# Patient Record
Sex: Female | Born: 1945
Health system: Southern US, Community
[De-identification: ages and names within clinical notes are randomized; demographics above are authoritative.]

## PROBLEM LIST (undated history)

## (undated) DIAGNOSIS — G8929 Other chronic pain: Secondary | ICD-10-CM

## (undated) DIAGNOSIS — R51 Headache: Secondary | ICD-10-CM

## (undated) DIAGNOSIS — M542 Cervicalgia: Secondary | ICD-10-CM

## (undated) DIAGNOSIS — J449 Chronic obstructive pulmonary disease, unspecified: Secondary | ICD-10-CM

## (undated) DIAGNOSIS — M654 Radial styloid tenosynovitis [de Quervain]: Secondary | ICD-10-CM

## (undated) DIAGNOSIS — E119 Type 2 diabetes mellitus without complications: Secondary | ICD-10-CM

## (undated) DIAGNOSIS — I10 Essential (primary) hypertension: Secondary | ICD-10-CM

## (undated) DIAGNOSIS — E7800 Pure hypercholesterolemia, unspecified: Secondary | ICD-10-CM

## (undated) DIAGNOSIS — IMO0002 Reserved for concepts with insufficient information to code with codable children: Secondary | ICD-10-CM

## (undated) DIAGNOSIS — K219 Gastro-esophageal reflux disease without esophagitis: Secondary | ICD-10-CM

## (undated) DIAGNOSIS — J301 Allergic rhinitis due to pollen: Secondary | ICD-10-CM

## (undated) DIAGNOSIS — Z8619 Personal history of other infectious and parasitic diseases: Secondary | ICD-10-CM

## (undated) DIAGNOSIS — D869 Sarcoidosis, unspecified: Secondary | ICD-10-CM

## (undated) DIAGNOSIS — J40 Bronchitis, not specified as acute or chronic: Secondary | ICD-10-CM

## (undated) DIAGNOSIS — G4700 Insomnia, unspecified: Secondary | ICD-10-CM

## (undated) DIAGNOSIS — F419 Anxiety disorder, unspecified: Secondary | ICD-10-CM

## (undated) DIAGNOSIS — M7500 Adhesive capsulitis of unspecified shoulder: Secondary | ICD-10-CM

## (undated) DIAGNOSIS — R42 Dizziness and giddiness: Secondary | ICD-10-CM

## (undated) DIAGNOSIS — K5792 Diverticulitis of intestine, part unspecified, without perforation or abscess without bleeding: Secondary | ICD-10-CM

## (undated) DIAGNOSIS — M810 Age-related osteoporosis without current pathological fracture: Secondary | ICD-10-CM

## (undated) HISTORY — DX: Adhesive capsulitis of unspecified shoulder: M75.00

## (undated) HISTORY — PX: BREAST LUMPECTOMY: SHX2

## (undated) HISTORY — DX: Age-related osteoporosis without current pathological fracture: M81.0

## (undated) HISTORY — DX: Type 2 diabetes mellitus without complications: E11.9

## (undated) HISTORY — PX: BREAST SURGERY: SHX581

## (undated) HISTORY — PX: COLONOSCOPY: SHX174

## (undated) HISTORY — PX: TONSILLECTOMY: SUR1361

## (undated) HISTORY — PX: WRIST SURGERY: SHX841

## (undated) HISTORY — DX: Anxiety disorder, unspecified: F41.9

---

## 1973-03-18 HISTORY — PX: ABDOMINAL HYSTERECTOMY: SHX81

## 1998-02-02 ENCOUNTER — Ambulatory Visit (HOSPITAL_COMMUNITY): Admission: RE | Admit: 1998-02-02 | Discharge: 1998-02-02 | Payer: Self-pay | Admitting: Internal Medicine

## 1999-12-14 ENCOUNTER — Other Ambulatory Visit: Admission: RE | Admit: 1999-12-14 | Discharge: 1999-12-14 | Payer: Self-pay | Admitting: *Deleted

## 2000-01-02 ENCOUNTER — Ambulatory Visit (HOSPITAL_COMMUNITY): Admission: RE | Admit: 2000-01-02 | Discharge: 2000-01-02 | Payer: Self-pay | Admitting: Gastroenterology

## 2005-12-04 ENCOUNTER — Ambulatory Visit (HOSPITAL_COMMUNITY): Admission: RE | Admit: 2005-12-04 | Discharge: 2005-12-04 | Payer: Self-pay | Admitting: Internal Medicine

## 2005-12-04 ENCOUNTER — Encounter: Payer: Self-pay | Admitting: Vascular Surgery

## 2009-03-18 HISTORY — PX: REFRACTIVE SURGERY: SHX103

## 2009-05-12 ENCOUNTER — Encounter: Admission: RE | Admit: 2009-05-12 | Discharge: 2009-05-12 | Payer: Self-pay | Admitting: Internal Medicine

## 2009-05-20 ENCOUNTER — Emergency Department (HOSPITAL_COMMUNITY): Admission: EM | Admit: 2009-05-20 | Discharge: 2009-05-21 | Payer: Self-pay | Admitting: Emergency Medicine

## 2009-07-04 ENCOUNTER — Inpatient Hospital Stay (HOSPITAL_COMMUNITY): Admission: EM | Admit: 2009-07-04 | Discharge: 2009-07-07 | Payer: Self-pay | Admitting: Emergency Medicine

## 2009-08-09 ENCOUNTER — Encounter: Admission: RE | Admit: 2009-08-09 | Discharge: 2009-10-04 | Payer: Self-pay | Admitting: Orthopedic Surgery

## 2009-10-06 ENCOUNTER — Ambulatory Visit (HOSPITAL_COMMUNITY): Admission: RE | Admit: 2009-10-06 | Discharge: 2009-10-06 | Payer: Self-pay | Admitting: Orthopedic Surgery

## 2009-10-10 ENCOUNTER — Encounter: Admission: RE | Admit: 2009-10-10 | Discharge: 2009-10-16 | Payer: Self-pay | Admitting: Orthopedic Surgery

## 2009-12-12 ENCOUNTER — Encounter: Admission: RE | Admit: 2009-12-12 | Discharge: 2009-12-13 | Payer: Self-pay | Admitting: Orthopedic Surgery

## 2009-12-19 ENCOUNTER — Encounter
Admission: RE | Admit: 2009-12-19 | Discharge: 2010-03-05 | Payer: Self-pay | Source: Home / Self Care | Attending: Physical Medicine & Rehabilitation | Admitting: Physical Medicine & Rehabilitation

## 2009-12-25 ENCOUNTER — Ambulatory Visit: Payer: Self-pay | Admitting: Physical Medicine & Rehabilitation

## 2010-01-15 ENCOUNTER — Ambulatory Visit: Payer: Self-pay | Admitting: Physical Medicine & Rehabilitation

## 2010-02-13 ENCOUNTER — Ambulatory Visit: Payer: Self-pay | Admitting: Physical Medicine & Rehabilitation

## 2010-03-05 ENCOUNTER — Ambulatory Visit: Payer: Self-pay | Admitting: Physical Medicine & Rehabilitation

## 2010-03-18 DIAGNOSIS — Z8619 Personal history of other infectious and parasitic diseases: Secondary | ICD-10-CM

## 2010-03-18 HISTORY — PX: CARPAL TUNNEL RELEASE: SHX101

## 2010-03-18 HISTORY — DX: Personal history of other infectious and parasitic diseases: Z86.19

## 2010-03-23 ENCOUNTER — Emergency Department (HOSPITAL_COMMUNITY)
Admission: EM | Admit: 2010-03-23 | Discharge: 2010-03-23 | Payer: Self-pay | Source: Home / Self Care | Admitting: Emergency Medicine

## 2010-03-30 ENCOUNTER — Encounter
Admission: RE | Admit: 2010-03-30 | Discharge: 2010-04-17 | Payer: Self-pay | Source: Home / Self Care | Attending: Physical Medicine & Rehabilitation | Admitting: Physical Medicine & Rehabilitation

## 2010-04-03 ENCOUNTER — Ambulatory Visit
Admission: RE | Admit: 2010-04-03 | Discharge: 2010-04-03 | Payer: Self-pay | Source: Home / Self Care | Attending: Physical Medicine & Rehabilitation | Admitting: Physical Medicine & Rehabilitation

## 2010-04-07 ENCOUNTER — Encounter: Payer: Self-pay | Admitting: *Deleted

## 2010-05-01 ENCOUNTER — Ambulatory Visit: Payer: Worker's Compensation | Admitting: Physical Medicine & Rehabilitation

## 2010-05-01 DIAGNOSIS — IMO0001 Reserved for inherently not codable concepts without codable children: Secondary | ICD-10-CM

## 2010-05-01 DIAGNOSIS — M545 Low back pain, unspecified: Secondary | ICD-10-CM | POA: Insufficient documentation

## 2010-05-01 DIAGNOSIS — M4600 Spinal enthesopathy, site unspecified: Secondary | ICD-10-CM

## 2010-05-07 ENCOUNTER — Ambulatory Visit: Payer: Self-pay | Admitting: Physical Medicine & Rehabilitation

## 2010-05-11 ENCOUNTER — Ambulatory Visit: Payer: Worker's Compensation | Admitting: Physical Medicine & Rehabilitation

## 2010-05-11 ENCOUNTER — Encounter: Payer: PRIVATE HEALTH INSURANCE | Attending: Physical Medicine & Rehabilitation

## 2010-05-11 ENCOUNTER — Encounter: Payer: Worker's Compensation | Attending: Physical Medicine & Rehabilitation

## 2010-05-11 DIAGNOSIS — M545 Low back pain, unspecified: Secondary | ICD-10-CM | POA: Insufficient documentation

## 2010-05-11 DIAGNOSIS — M4600 Spinal enthesopathy, site unspecified: Secondary | ICD-10-CM

## 2010-05-14 ENCOUNTER — Ambulatory Visit: Payer: Self-pay | Admitting: Physical Medicine & Rehabilitation

## 2010-05-21 ENCOUNTER — Ambulatory Visit: Payer: Worker's Compensation | Admitting: Physical Medicine & Rehabilitation

## 2010-05-21 ENCOUNTER — Encounter: Payer: Worker's Compensation | Attending: Physical Medicine & Rehabilitation

## 2010-05-21 DIAGNOSIS — IMO0001 Reserved for inherently not codable concepts without codable children: Secondary | ICD-10-CM

## 2010-05-21 DIAGNOSIS — M545 Low back pain, unspecified: Secondary | ICD-10-CM | POA: Insufficient documentation

## 2010-05-23 NOTE — Procedures (Signed)
Sara [name], Sara [name]                ACCOUNT NO.:  [id number]  MEDICAL RECORD NO.:  [id number]           PATIENT TYPE:  LOCATION:                                 FACILITY:  PHYSICIAN:  Erick Colace, M.D.DATE OF BIRTH:  Nov 05, 1945  DATE OF PROCEDURE: DATE OF DISCHARGE:                              OPERATIVE REPORT  PROCEDURE:  Acupuncture treatment #3.  INDICATION:  Myofascial pain syndrome.  Pain is only partially responsive to medication management and other conservative care.  Rated as a 5-6/10.  NEEDLES PLACED:  On the right, BL 19, BL 21, BL 23, BL 25.  On the left, BL 19 and BL 25.  In addition on the right, BL 48, BL 49, BL 50, and BL 52 were additionally placed.  The patient tolerated the procedure well. Postprocedure instructions given.  Repeat treatment in 4 days.     Erick Colace, M.D. Electronically Signed    AEK/MEDQ  D:  05/21/2010 09:53:25  T:  05/21/2010 11:59:57  Job:  [id number]

## 2010-05-25 ENCOUNTER — Ambulatory Visit: Payer: Self-pay | Admitting: Physical Medicine & Rehabilitation

## 2010-05-25 ENCOUNTER — Ambulatory Visit: Payer: Worker's Compensation | Admitting: Physical Medicine & Rehabilitation

## 2010-05-25 DIAGNOSIS — M4600 Spinal enthesopathy, site unspecified: Secondary | ICD-10-CM

## 2010-06-01 ENCOUNTER — Ambulatory Visit: Payer: Worker's Compensation | Admitting: Physical Medicine & Rehabilitation

## 2010-06-01 DIAGNOSIS — M4600 Spinal enthesopathy, site unspecified: Secondary | ICD-10-CM

## 2010-06-05 LAB — COMPREHENSIVE METABOLIC PANEL
ALT: 18 U/L (ref 0–35)
Alkaline Phosphatase: 61 U/L (ref 39–117)
BUN: 11 mg/dL (ref 6–23)
Chloride: 104 mEq/L (ref 96–112)
Creatinine, Ser: 0.88 mg/dL (ref 0.4–1.2)
GFR calc Af Amer: >60 mL/min (ref >60)
Potassium: 4.3 mEq/L (ref 3.5–5.1)
Sodium: 134 mEq/L — ABNORMAL LOW (ref 135–145)
Total Bilirubin: 0.6 mg/dL (ref 0.3–1.2)
Total Protein: 6.7 g/dL (ref 6.0–8.3)

## 2010-06-05 LAB — URINALYSIS, ROUTINE W REFLEX MICROSCOPIC
Hgb urine dipstick: NEGATIVE
Ketones, ur: NEGATIVE mg/dL
Nitrite: NEGATIVE
Urobilinogen, UA: 0.2 mg/dL (ref 0.0–1.0)
pH: 6.5 (ref 5.0–8.0)

## 2010-06-05 LAB — CBC
HCT: 37.4 % (ref 36.0–46.0)
Platelets: 267 10*3/uL (ref 150–400)
RDW: 13.9 % (ref 11.5–15.5)

## 2010-06-05 IMAGING — CR DG WRIST COMPLETE 3+V*R*
4 series · 4 of 4 positions shown · non-contrast
Comparison: None

CLINICAL DATA: Fell.  Right wrist pain.

RIGHT WRIST - COMPLETE 3+ VIEW

[x wrist pa right]
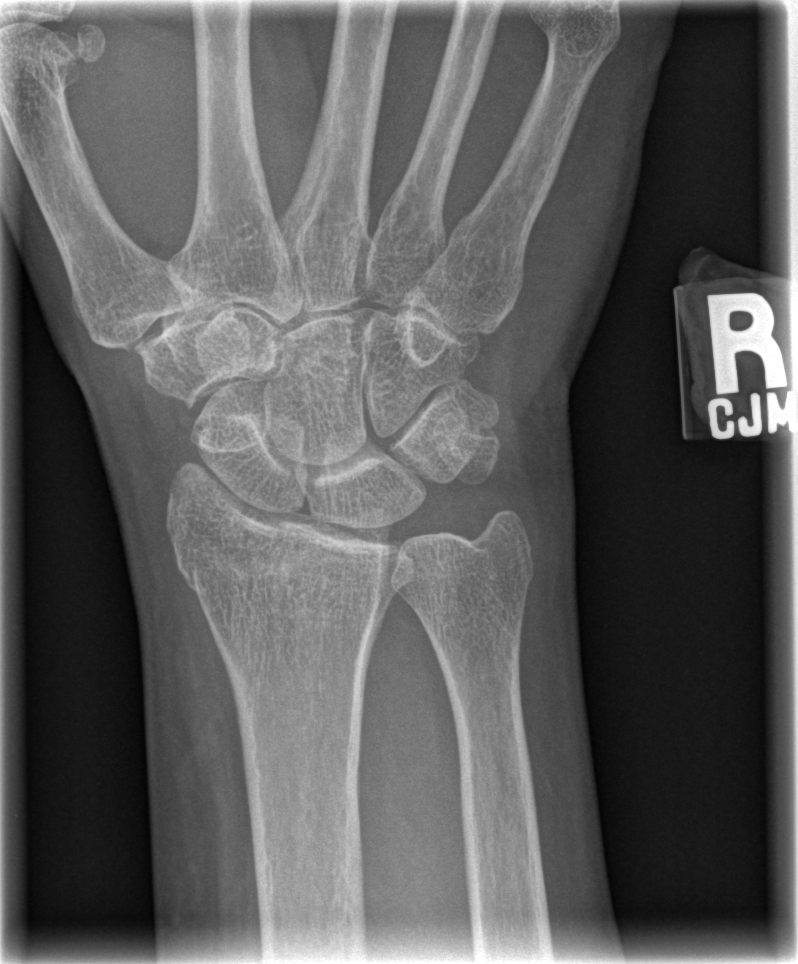

[x wrist obl right]
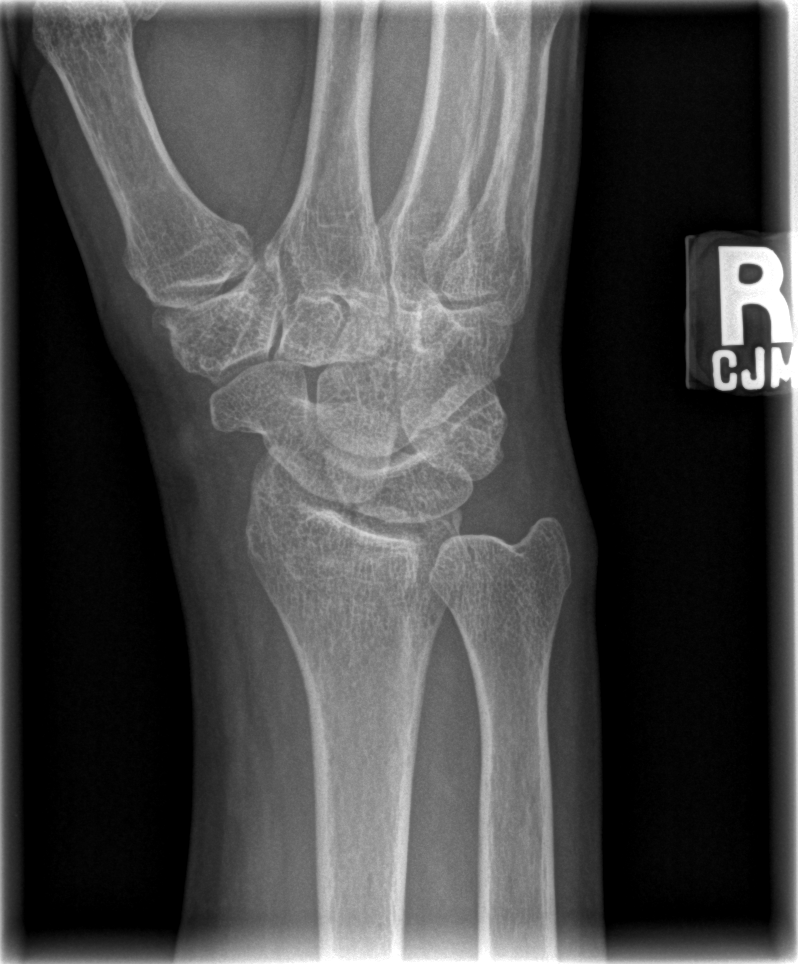

[x wrist lat right]
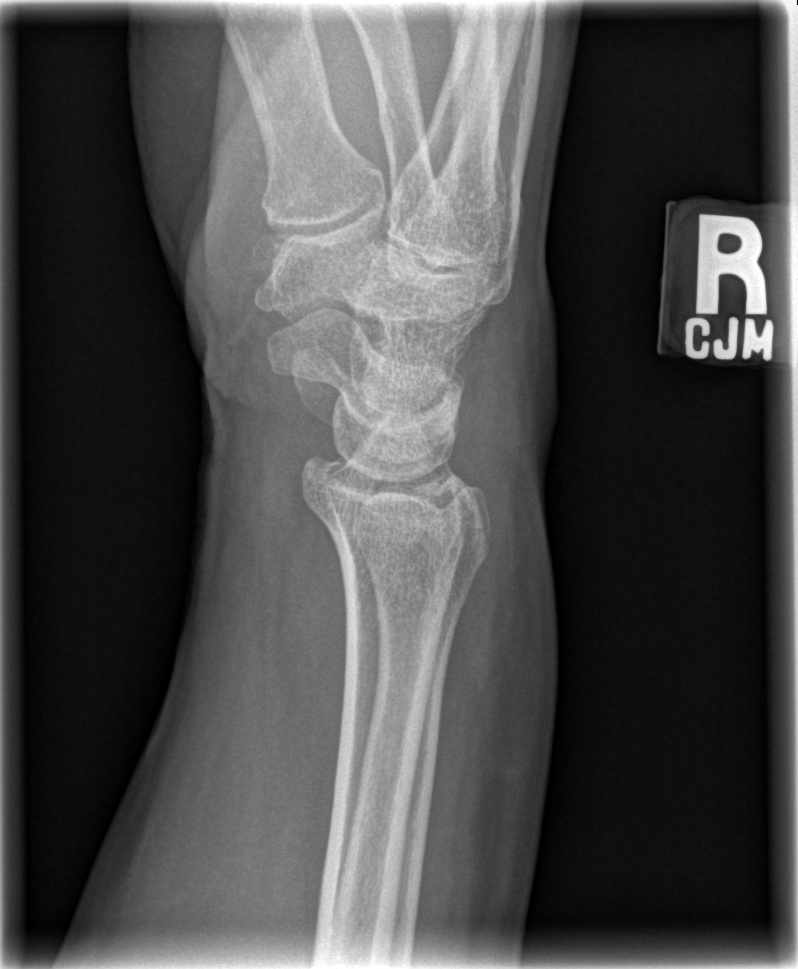

[x navicular]
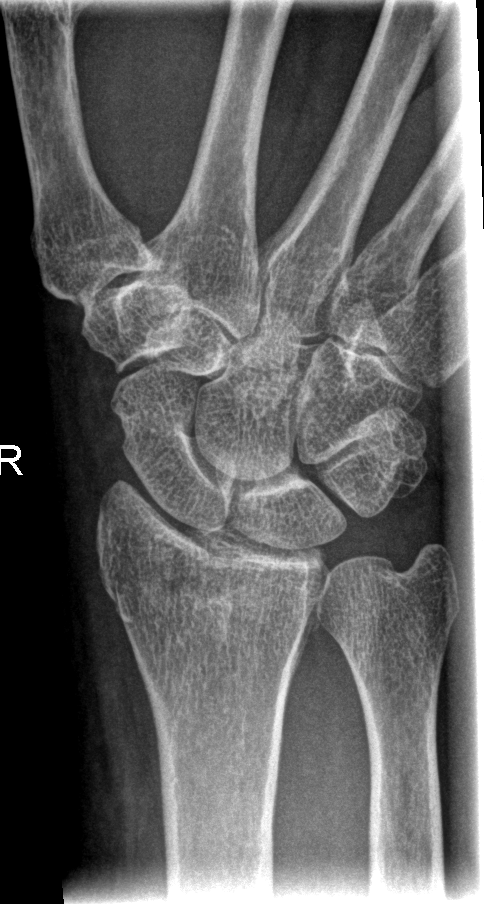

[4 of 4 positions shown; findings below may reference images not displayed]

FINDINGS: There is a nondisplaced intra-articular fracture of the
distal radius.  The ulna is intact.  The carpal bones are intact.
IMPRESSION: Nondisplaced intra-articular fracture of the distal radius.

## 2010-06-07 ENCOUNTER — Ambulatory Visit: Payer: Worker's Compensation | Admitting: Physical Medicine & Rehabilitation

## 2010-06-07 DIAGNOSIS — M4600 Spinal enthesopathy, site unspecified: Secondary | ICD-10-CM

## 2010-06-08 ENCOUNTER — Ambulatory Visit: Payer: Worker's Compensation | Admitting: Physical Medicine & Rehabilitation

## 2010-06-11 LAB — COMPREHENSIVE METABOLIC PANEL
AST: 19 U/L (ref 0–37)
Albumin: 3.8 g/dL (ref 3.5–5.2)
BUN: 10 mg/dL (ref 6–23)
CO2: 27 mEq/L (ref 19–32)
Creatinine, Ser: 0.89 mg/dL (ref 0.4–1.2)
Glucose, Bld: 123 mg/dL — ABNORMAL HIGH (ref 70–99)
Potassium: 4 mEq/L (ref 3.5–5.1)
Sodium: 140 mEq/L (ref 135–145)

## 2010-06-11 LAB — CBC
Hemoglobin: 12 g/dL (ref 12.0–15.0)
MCHC: 34.7 g/dL (ref 30.0–36.0)
MCV: 89.4 fL (ref 78.0–100.0)
RDW: 13 % (ref 11.5–15.5)

## 2010-06-11 LAB — URINALYSIS, ROUTINE W REFLEX MICROSCOPIC
Nitrite: NEGATIVE
Protein, ur: NEGATIVE mg/dL
Urobilinogen, UA: 0.2 mg/dL (ref 0.0–1.0)

## 2010-06-11 LAB — CULTURE, BLOOD (ROUTINE X 2)
Culture: NO GROWTH
Culture: NO GROWTH

## 2010-06-11 LAB — DIFFERENTIAL
Basophils Relative: 1 % (ref 0–1)
Monocytes Absolute: 0.5 10*3/uL (ref 0.1–1.0)

## 2010-06-11 LAB — LIPASE, BLOOD: Lipase: 18 U/L (ref 11–59)

## 2010-06-15 ENCOUNTER — Ambulatory Visit: Payer: Worker's Compensation | Admitting: Physical Medicine & Rehabilitation

## 2010-06-15 DIAGNOSIS — M4600 Spinal enthesopathy, site unspecified: Secondary | ICD-10-CM

## 2010-06-21 ENCOUNTER — Ambulatory Visit: Payer: Worker's Compensation | Admitting: Physical Medicine & Rehabilitation

## 2010-06-29 ENCOUNTER — Ambulatory Visit: Payer: Worker's Compensation | Admitting: Physical Medicine & Rehabilitation

## 2010-06-29 ENCOUNTER — Encounter: Payer: Worker's Compensation | Attending: Physical Medicine & Rehabilitation

## 2010-06-29 DIAGNOSIS — M545 Low back pain, unspecified: Secondary | ICD-10-CM | POA: Insufficient documentation

## 2010-06-29 DIAGNOSIS — IMO0001 Reserved for inherently not codable concepts without codable children: Secondary | ICD-10-CM

## 2010-06-30 NOTE — Procedures (Signed)
NAMEANNALEI, FRIESZ                ACCOUNT NO.:  [id number]  MEDICAL RECORD NO.:  [id number]           PATIENT TYPE:  O  LOCATION:  [facility]                          FACILITY:  [facility]  PHYSICIAN:  Erick Colace, M.D.DATE OF BIRTH:  Mar 14, 1946  DATE OF PROCEDURE:  06/15/2010 DATE OF DISCHARGE:                              OPERATIVE REPORT  PROCEDURE:  Acupuncture treatment #7.  INDICATIONS:  Myofascial pain, lumbar paraspinal, right greater than left side.  Needle placed at bilateral BL 19, bilateral BL 25, right BL 21, right BL, 23 right BL, 48 right BL 49, right BL 50, right BL 51.  A 4 Hz stimulation for 30 minutes.  The patient tolerated the procedure well. See her back in 1 week for repeat.     Erick Colace, M.D. Electronically Signed    AEK/MEDQ  D:  06/29/2010 16:49:12  T:  06/30/2010 05:22:54  Job:  [id number]  cc:   Tina Griffiths

## 2010-07-02 NOTE — Procedures (Signed)
Sara [name], Sara [name]                ACCOUNT NO.:  [id number]  MEDICAL RECORD NO.:  [id number]           PATIENT TYPE:  LOCATION:                                 FACILITY:  PHYSICIAN:  Erick Colace, M.D.DATE OF BIRTH:  1945/10/26  DATE OF PROCEDURE:  06/29/2010 DATE OF DISCHARGE:                              OPERATIVE REPORT  REASON FOR VISIT:  Acupuncture treatment #8.  INDICATION:  Myofascial pain, lumbar paraspinal, right greater than left side.  Pain is improving, currently is at a 3/10 level.  The patient placed prone on exam table.  Needles placed bilateral BL 19, bilateral BL 25, right BL 21, right BL 23, right BL 48, 49, 50, and 51.  A 4 Hz stimulation for 30 minutes.  The patient tolerated the procedure well. Postprocedure instructions.  I will see her back in 2 weeks for followup make sure she maintains.     Erick Colace, M.D. Electronically Signed    AEK/MEDQ  D:  06/29/2010 16:48:11  T:  06/30/2010 05:21:45  Job:  [id number]  cc:   Tina Griffiths [phone]

## 2010-07-12 ENCOUNTER — Encounter: Payer: Worker's Compensation | Attending: Neurosurgery | Admitting: Neurosurgery

## 2010-07-12 DIAGNOSIS — M5430 Sciatica, unspecified side: Secondary | ICD-10-CM

## 2010-07-12 DIAGNOSIS — R259 Unspecified abnormal involuntary movements: Secondary | ICD-10-CM | POA: Insufficient documentation

## 2010-07-12 DIAGNOSIS — R209 Unspecified disturbances of skin sensation: Secondary | ICD-10-CM | POA: Insufficient documentation

## 2010-07-12 DIAGNOSIS — M545 Low back pain, unspecified: Secondary | ICD-10-CM | POA: Insufficient documentation

## 2010-07-12 DIAGNOSIS — G8929 Other chronic pain: Secondary | ICD-10-CM | POA: Insufficient documentation

## 2010-07-12 NOTE — Assessment & Plan Note (Signed)
HISTORY OF PRESENT ILLNESS:  The patient is a workers' compensation patient being followed by Dr. Wynn Banker for low back pain, some paresthesias in her legs for sometime, and undergone successful acupuncture.  She called due to the fact that she needed to be seen after at work the other day she had flare-up in her back.  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  She describes the same pain as low back pain and posterior leg paresthesias today.  She only rates her pain now at about 6.  She states it has gotten much better.  Her activity level is about 10.  Pain is bad 24 hours.  Sleep is poor.  Pain is worse with all activities.  Medicine and TENS unit seems to help.  She can walk without assistance.  She is employed at [employer].  She works 40 hours a week.  She has no trouble with her ADLs, some confusion, anxiety, dizziness, trouble walking, tremor, and tingling with some weakness in lower extremities. Otherwise, review of systems is within normal limits except for complaints of poor appetite.  PHYSICAL EXAMINATION:  Vital Signs:  Blood pressure 138/81, pulse 82, respirations 18, and O2 sats 100% on room air.  Neurologic:  Motor strength is 5/5 in lower extremities bilaterally including iliopsoas, quadriceps, dorsiflexors, EHL, and plantar flexors.  Her sensation is intact to pinprick throughout the upper and lower extremities.  She does have some increased lower extremity paresthesias subjectively.  Deep tendon reflexes are diminished bilaterally.  She can only flex to about 45 degrees and extend about 5 degrees with some pain.  She is alert and oriented.  Her affect is bright and alert.  Constitutionally, she is within normal limits.  She asked me about her sleep habits and poor sleep.  I told her she needs to speak to her  primary care doctor about that.  Regarding her back, I think she has spasms.  IMPRESSION:  Chronic low back pain with lower extremity paresthesias with acute spasm.  PLAN:  I am going to start her on Robaxin 500 mg 1 p.o. b.i.d., she is in agreement with this.  We did not prescribe any narcotics.  She will continue her other medicines through her other doctors and some followup acupuncture here possibly with Dr. Wynn Banker.  I did go over everything with her case Production designer, theatre/television/film, Administrator, arts.     Julianny Milstein L. Blima Dessert    RLW/MedQ D:  07/12/2010 10:02:30  T:  07/12/2010 22:49:37  Job #:  [id number]  cc:   Tina Griffiths

## 2010-07-17 ENCOUNTER — Ambulatory Visit: Payer: Worker's Compensation | Admitting: Physical Medicine & Rehabilitation

## 2010-07-23 ENCOUNTER — Ambulatory Visit: Payer: Worker's Compensation | Admitting: Physical Medicine & Rehabilitation

## 2010-07-27 ENCOUNTER — Encounter: Payer: Worker's Compensation | Attending: Physical Medicine & Rehabilitation

## 2010-07-27 ENCOUNTER — Ambulatory Visit: Payer: Worker's Compensation | Admitting: Physical Medicine & Rehabilitation

## 2010-07-27 DIAGNOSIS — M545 Low back pain, unspecified: Secondary | ICD-10-CM | POA: Insufficient documentation

## 2010-07-27 DIAGNOSIS — M4600 Spinal enthesopathy, site unspecified: Secondary | ICD-10-CM

## 2010-07-27 NOTE — Assessment & Plan Note (Signed)
REASON FOR VISIT:  Low back pain.  Sara [name] returns today.  She is a 65 year old female with work-related low back injury sustained when falling backwards at work on July 04, 2009. Her back pain has improved with time, physical therapy, and acupuncture. Her pain levels were initially at a 5-6 and with activity at 10.  She is down to a 3, both with and without activity.  Current pain medications include meloxicam 15 mg a day with Robaxin 500 one p.o. b.i.d.  She was started on Robaxin by our mid-level provider.  OTHER MEDICAL HISTORY:  She is undergoing removal of hardware of left wrist by Dr. Melvyn Novas on Aug 02, 2010.  She continues being employed 40 hours a week.  She is on light duty for her wrist.  She has trouble walking, spasms, and dizziness.  Blood pressure 128/68, pulse 81, respirations 18, and sat 97% on room air.  On examination, she has no tenderness to palpation of lumbar paraspinal muscles.  She has negative straight leg raising test.  She is 5/5 bilaterally in iliopsoas, quadriceps dorsiflexors, EHL, and plantar flexors.  She has 1+ deep tendon reflexes bilaterally.  Mood and affect are appropriate.  Vital signs, blood pressure 128/68, pulse 81, respirations 18, and sat 97% on room air.  Gait is normal.  IMPRESSION:  Chronic low back pain.  She has some intermittent back spasms continued.  We will continue her Robaxin for another couple of weeks for sure.  We will reduce her meloxicam to 7.5.  We will continue her Nexium while she is on the meloxicam.  I will see her back in 1 month.  At that time if she is doing well, we will take her off the meloxicam as well as the Nexium.  I called in caseworker, [employer].  We went over the plan with Tina Griffiths who is her medical case manager as well.     Erick Colace, M.D. Electronically Signed    AEK/MedQ D:  07/27/2010 10:15:15  T:  07/27/2010 22:10:23  Job #:  [id number]  cc:   Madelynn Done,  MD Fax: [phone]  [facility]

## 2010-07-30 ENCOUNTER — Ambulatory Visit: Payer: Worker's Compensation | Admitting: Physical Medicine & Rehabilitation

## 2010-08-01 ENCOUNTER — Encounter
Admission: RE | Admit: 2010-08-01 | Discharge: 2010-08-01 | Disposition: A | Discharge status: Home / Self Care | Payer: PRIVATE HEALTH INSURANCE | Source: Ambulatory Visit | Attending: Orthopedic Surgery | Admitting: Orthopedic Surgery

## 2010-08-01 DIAGNOSIS — Z01810 Encounter for preprocedural cardiovascular examination: Secondary | ICD-10-CM | POA: Insufficient documentation

## 2010-08-01 DIAGNOSIS — Z01812 Encounter for preprocedural laboratory examination: Secondary | ICD-10-CM | POA: Insufficient documentation

## 2010-08-01 LAB — BASIC METABOLIC PANEL
CO2: 28 mEq/L (ref 19–32)
Calcium: 9.9 mg/dL (ref 8.4–10.5)
Chloride: 106 mEq/L (ref 96–112)
GFR calc non Af Amer: >60 mL/min (ref >60)
Glucose, Bld: 98 mg/dL (ref 70–99)
Potassium: 4.1 mEq/L (ref 3.5–5.1)

## 2010-08-02 ENCOUNTER — Ambulatory Visit (HOSPITAL_BASED_OUTPATIENT_CLINIC_OR_DEPARTMENT_OTHER)
Admission: RE | Admit: 2010-08-02 | Discharge: 2010-08-02 | Disposition: A | Discharge status: Home / Self Care | Payer: PRIVATE HEALTH INSURANCE | Source: Ambulatory Visit | Attending: Orthopedic Surgery | Admitting: Orthopedic Surgery

## 2010-08-02 DIAGNOSIS — Z472 Encounter for removal of internal fixation device: Secondary | ICD-10-CM | POA: Insufficient documentation

## 2010-08-02 DIAGNOSIS — G5600 Carpal tunnel syndrome, unspecified upper limb: Secondary | ICD-10-CM | POA: Insufficient documentation

## 2010-08-03 NOTE — Procedures (Signed)
[place]  Patient:    Sara [name], Sara [name]                       MRN: [id number] Proc. Date: 01/02/00 Adm. Date:  [id number] Attending:  Charna Elizabeth CC:         Lilly Cove, M.D.   Procedure Report  DATE OF BIRTH:  04-Oct-1945  REFERRING PHYSICIAN:  Lilly Cove, M.D.  PROCEDURE PERFORMED:  Colonoscopy.  ENDOSCOPIST:  Anselmo Rod, M.D.  INSTRUMENT USED:  Olympus video colonoscope.  INDICATIONS FOR PROCEDURE:  Guaiac positive stool in a 65 year old black female rule out colonic polyps, masses, hemorrhoids, etc.  PREPROCEDURE PREPARATION:  Informed consent was procured from the patient. The patient was fasted for eight hours prior to the procedure and prepped with a bottle of magnesium citrate and a gallon of NuLytely the night prior to the procedure.  PREPROCEDURE PHYSICAL:  The patient had stable vital signs.  Neck supple. Chest clear to auscultation.  S1, S2 regular.  Abdomen soft with normal abdominal bowel sounds.  DESCRIPTION OF PROCEDURE:  The patient was placed in the left lateral decubitus position and sedated with 50 mg of Demerol and 5 mg of Versed intravenously.  Once the patient was adequately sedated and maintained on low-flow oxygen and continuous cardiac monitoring, the Olympus video colonoscope was advanced from the rectum to the cecum with difficulty because of a large amount of residual stool in the colon; however, no large masses or polyps were seen.  There were a few scattered diverticula throughout the colon.  The patient tolerated the procedure well without complications.  The procedure was complete up to the cecum and the cecal base was adequately visualized.  IMPRESSION: 1. Essentially unrevealing colonoscopy except for a few scattered diverticula. 2. Large amount of residual stool in the colon.  A very small lesion may have    been missed.  RECOMMENDATIONS:  Repeat guaiac testing is recommended on  an outpatient basis. Further recommendations will be made at that time.DD:  01/02/00 TD:  01/02/00 Job: [id number]

## 2010-08-11 NOTE — Op Note (Signed)
Sara [name], [name]                ACCOUNT NO.:  [id number]  MEDICAL RECORD NO.:  [id number]           PATIENT TYPE:  LOCATION:                                 FACILITY:  PHYSICIAN:  Sara Done, MD  DATE OF BIRTH:  15-Oct-1945  DATE OF PROCEDURE:  08/02/2010 DATE OF DISCHARGE:                              OPERATIVE REPORT   PREOPERATIVE DIAGNOSES: 1. Left hand carpal tunnel syndrome. 2. Left hand retained deep implant, distal radius fracture.  POSTOPERATIVE DIAGNOSES: 1. Left hand carpal tunnel syndrome. 2. Left hand retained deep implant, distal radius fracture.  SURGICAL PROCEDURES: 1. Left hand carpal tunnel release. 2. Left hand deep implant removal. 3. Left wrist scar revision and layered wound closure. 4. Radiograph three views of left wrist.  ATTENDING PHYSICIAN:  Sara Covert IV, MD, was scrubbed and present for the entire procedure.  ASSISTANT SURGEON:  None.  ANESTHESIA:  General via LMA.  TOURNIQUET TIME:  Less than 40 minutes at 250 mmHg.  SURGICAL IMPLANTS REMOVED:  Synthes volar distal radius plate.  SURGICAL INDICATIONS:  [name] is a 65 year old right-hand-dominant female with persistent signs of left hand carpal tunnel.  This had been then refractory to conservative treatment.  The patient also had a retained volar plate for which she elected to undergo the above procedures.  Risks, benefits, and alternatives were discussed in detail with the patient and a signed informed consent was obtained.  Risks include but not limited to bleeding, infection, damage to nearby nerves, arteries, or tendons, loss of motion of the elbow, wrist, and digits, persistent symptoms, refracture, need for further surgical intervention.  DESCRIPTION OF PROCEDURE:  The patient was properly identified in the preop holding area, mark with permanent marker made on the left wrist to indicate the correct operative site.  The patient was then brought back to the  operating room and was placed supine on anesthesia room table where general anesthesia was administered.  The patient tolerated this well.  A well-padded tourniquet was then placed on the left brachium and sealed with 1000 drape.  Left upper extremity was then prepped and draped in normal sterile fashion.  A time-out was called, correct site identified, and the procedure was then begun.  Attention was then turned to the left wrist.  The limb was then elevated using Esmarch exsanguination and tourniquet insufflated.  Attention was then turned to the deep hardware removal.  Previous skin incision was then excised.  An elliptical incision was then made of the keloid and hypertrophic skin. Dissection was then carried down through the FCR sheath where it was opened both proximally and distally.  Going through the FCR sheath, the FPL was identified.  The pronator quadratus was then elevated off the bone.  After elevation of the pronator quadratus, the distal radius plate was then identified.  The Synthes volar distal radius plate was then removed after all the screws were then removed.  The wounds were then thoroughly irrigated.  There was no complicating features of removal of the hot implant.  Copious irrigation was then [name]. Following this, the attention was then turned  to the carpal tunnel release.  A several-centimeter incision was made directly in the mid palm.  This was through a separate incision.  Dissection was then carried down through the skin and subcutaneous tissue.  The palmar fascia was then incised longitudinally.  The transverse carpal ligament was identified and released under direct visualization.  The contents of the carpal tunnel were then inspected.  No significant abnormalities were noted.  The wound was then thoroughly irrigated.  Separate incision was then closed using 4-0 Prolene horizontal mattress sutures.  The hardware removal incision was then closed in layers.   The skin that had been excised was then advanced together with 3-0 Monocryl suture to form a straight line.  It was then closed with a running 4-0 Prolene subcuticular suture.  A 9 mL of 0.25% Marcaine infiltrated.  Adaptic dressing and sterile compressive bandage was applied.  The patient was then extubated and taken to recovery room in good condition after being placed in a volar splint.  POSTOPERATIVE PLAN:  The patient was discharged home, see her back in the office in approximately 11 days for wound check, suture removal, and begin a postoperative carpal tunnel eval.     Sara Done, MD     FWO/MEDQ  D:  08/02/2010  T:  08/03/2010  Job:  [id number]  Electronically Signed by Sara [name] IV MD on 08/11/2010 11:56:24 AM

## 2010-08-22 ENCOUNTER — Ambulatory Visit: Payer: Worker's Compensation | Attending: Orthopedic Surgery | Admitting: Occupational Therapy

## 2010-08-22 DIAGNOSIS — M2560 Stiffness of unspecified joint, not elsewhere classified: Secondary | ICD-10-CM | POA: Insufficient documentation

## 2010-08-22 DIAGNOSIS — IMO0001 Reserved for inherently not codable concepts without codable children: Secondary | ICD-10-CM | POA: Insufficient documentation

## 2010-08-22 DIAGNOSIS — M2550 Pain in unspecified joint: Secondary | ICD-10-CM | POA: Insufficient documentation

## 2010-08-22 DIAGNOSIS — M6281 Muscle weakness (generalized): Secondary | ICD-10-CM | POA: Insufficient documentation

## 2010-08-23 ENCOUNTER — Ambulatory Visit: Payer: Worker's Compensation | Admitting: Occupational Therapy

## 2010-08-27 ENCOUNTER — Encounter: Payer: Self-pay | Admitting: Occupational Therapy

## 2010-08-27 ENCOUNTER — Ambulatory Visit: Payer: Worker's Compensation | Admitting: Physical Medicine & Rehabilitation

## 2010-08-27 ENCOUNTER — Encounter: Payer: Worker's Compensation | Attending: Physical Medicine & Rehabilitation

## 2010-08-27 DIAGNOSIS — M4600 Spinal enthesopathy, site unspecified: Secondary | ICD-10-CM

## 2010-08-27 DIAGNOSIS — M545 Low back pain, unspecified: Secondary | ICD-10-CM | POA: Insufficient documentation

## 2010-08-28 NOTE — Assessment & Plan Note (Signed)
REASON FOR VISIT:  Right side greater than left-sided back pain.  HISTORY:  A 65 year old female with thoracolumbar lumbar myofascial pain.  Average pain about 2/10.  Sleep is good.  She has made some good improvements following completion of acupuncture treatments.  She had a total of 8 treatments followed by TENS units usage.  We have been able to get her off of her tramadol.  She is off of Robaxin at this point. She is reduced her meloxicam from 15 to 7.5 a day.  Continues to take Nexium for GI prophylaxis.  She feels like she is doing well enough that she does not really have to take the meloxicam anymore.  REVIEW OF SYSTEMS:  Positive for trouble walking, spasms, weakness, but this is mild.  She has been able to do her work activities.  She still follows up Dr. Melvyn Novas, regards to left wrist problems after fracture.  PHYSICAL EXAMINATION:  VITAL SIGNS:  Blood pressure 130/55, pulse 82, respirations 18, and O2 sat 98% room air. GENERAL:  No acute stress.  Mood and affect appropriate.  Her back has no tenderness to palpation.  She has normal spine range of motion. Negative straight leg raise.  Normal lower extremity strength.  Gait is normal.  IMPRESSION:  Thoracolumbar pain mainly myofascial, improved after multimodal treatment approach.  Oswestry score still 24%, but is able to do her usual activities.  PLAN:  We will discontinue her Mobic.  She will take either Tylenol or Advil at over-the-counter doses on p.r.n. basis.  Use TENS for flare ups, she also uses when she gets home from work.  I will see her back in 1 month to see how she is doing on her p.r.n. over-the-counter medications, and at that point would likely be at the maximal medical improvement.  I do not anticipate any residual disability from her low back area.     Erick Colace, M.D. Electronically Signed    AEK/MedQ D:  08/27/2010 15:39:47  T:  08/28/2010 03:34:11  Job #:  [id number]  cc:   Tina Griffiths fax [phone]

## 2010-08-29 ENCOUNTER — Ambulatory Visit: Payer: Worker's Compensation | Admitting: Occupational Therapy

## 2010-09-03 ENCOUNTER — Ambulatory Visit: Payer: Worker's Compensation | Admitting: Occupational Therapy

## 2010-09-07 ENCOUNTER — Ambulatory Visit: Payer: Worker's Compensation | Admitting: Occupational Therapy

## 2010-09-10 ENCOUNTER — Ambulatory Visit: Payer: Worker's Compensation | Admitting: Occupational Therapy

## 2010-09-14 ENCOUNTER — Ambulatory Visit: Payer: Worker's Compensation | Admitting: Occupational Therapy

## 2010-09-25 ENCOUNTER — Ambulatory Visit: Payer: PRIVATE HEALTH INSURANCE | Attending: Orthopedic Surgery | Admitting: Occupational Therapy

## 2010-09-25 DIAGNOSIS — M2550 Pain in unspecified joint: Secondary | ICD-10-CM | POA: Insufficient documentation

## 2010-09-25 DIAGNOSIS — M2560 Stiffness of unspecified joint, not elsewhere classified: Secondary | ICD-10-CM | POA: Insufficient documentation

## 2010-09-25 DIAGNOSIS — IMO0001 Reserved for inherently not codable concepts without codable children: Secondary | ICD-10-CM | POA: Insufficient documentation

## 2010-09-25 DIAGNOSIS — M6281 Muscle weakness (generalized): Secondary | ICD-10-CM | POA: Insufficient documentation

## 2010-09-27 ENCOUNTER — Ambulatory Visit: Payer: PRIVATE HEALTH INSURANCE | Admitting: Occupational Therapy

## 2010-09-27 ENCOUNTER — Ambulatory Visit: Payer: Self-pay | Admitting: Physical Medicine & Rehabilitation

## 2010-10-03 ENCOUNTER — Ambulatory Visit: Payer: PRIVATE HEALTH INSURANCE | Admitting: Occupational Therapy

## 2010-10-05 ENCOUNTER — Ambulatory Visit: Payer: PRIVATE HEALTH INSURANCE | Admitting: Occupational Therapy

## 2010-10-10 ENCOUNTER — Ambulatory Visit: Payer: PRIVATE HEALTH INSURANCE | Admitting: Occupational Therapy

## 2010-10-12 ENCOUNTER — Ambulatory Visit: Payer: PRIVATE HEALTH INSURANCE | Attending: Orthopedic Surgery | Admitting: Occupational Therapy

## 2010-10-12 DIAGNOSIS — IMO0001 Reserved for inherently not codable concepts without codable children: Secondary | ICD-10-CM | POA: Insufficient documentation

## 2010-10-12 DIAGNOSIS — M2560 Stiffness of unspecified joint, not elsewhere classified: Secondary | ICD-10-CM | POA: Insufficient documentation

## 2010-10-12 DIAGNOSIS — M2550 Pain in unspecified joint: Secondary | ICD-10-CM | POA: Insufficient documentation

## 2010-10-12 DIAGNOSIS — M6281 Muscle weakness (generalized): Secondary | ICD-10-CM | POA: Insufficient documentation

## 2010-10-16 ENCOUNTER — Ambulatory Visit: Payer: PRIVATE HEALTH INSURANCE | Admitting: Occupational Therapy

## 2010-10-19 ENCOUNTER — Ambulatory Visit: Payer: PRIVATE HEALTH INSURANCE | Attending: Orthopedic Surgery | Admitting: Occupational Therapy

## 2010-10-19 DIAGNOSIS — IMO0001 Reserved for inherently not codable concepts without codable children: Secondary | ICD-10-CM | POA: Insufficient documentation

## 2010-10-19 DIAGNOSIS — M2560 Stiffness of unspecified joint, not elsewhere classified: Secondary | ICD-10-CM | POA: Insufficient documentation

## 2010-10-19 DIAGNOSIS — M2550 Pain in unspecified joint: Secondary | ICD-10-CM | POA: Insufficient documentation

## 2010-10-19 DIAGNOSIS — M6281 Muscle weakness (generalized): Secondary | ICD-10-CM | POA: Insufficient documentation

## 2010-10-23 ENCOUNTER — Ambulatory Visit: Payer: PRIVATE HEALTH INSURANCE | Admitting: Occupational Therapy

## 2010-10-26 ENCOUNTER — Ambulatory Visit: Payer: PRIVATE HEALTH INSURANCE | Admitting: Occupational Therapy

## 2010-10-29 ENCOUNTER — Ambulatory Visit: Payer: Self-pay | Admitting: Physical Medicine & Rehabilitation

## 2010-10-31 ENCOUNTER — Ambulatory Visit: Payer: PRIVATE HEALTH INSURANCE | Admitting: Occupational Therapy

## 2010-11-02 ENCOUNTER — Ambulatory Visit: Payer: PRIVATE HEALTH INSURANCE | Admitting: Occupational Therapy

## 2010-11-05 ENCOUNTER — Encounter: Payer: PRIVATE HEALTH INSURANCE | Attending: Physical Medicine & Rehabilitation

## 2010-11-05 ENCOUNTER — Ambulatory Visit: Payer: PRIVATE HEALTH INSURANCE | Admitting: Physical Medicine & Rehabilitation

## 2010-11-05 DIAGNOSIS — M545 Low back pain, unspecified: Secondary | ICD-10-CM | POA: Insufficient documentation

## 2010-11-05 DIAGNOSIS — M4600 Spinal enthesopathy, site unspecified: Secondary | ICD-10-CM

## 2010-11-05 NOTE — Assessment & Plan Note (Signed)
Case manager conference with the patient in regards to work-related injury.  I am treating the patient for thoracolumbar pain.  She is basically on no medications at the current time.  I discussed that if she has a flare-up as she has had as recently as 3 days ago, she can use over-the-counter ibuprofen 400-600 mg 3 times a day for 2-3 days.  I planned to see the patient back in 3 months at which point I think if things are stable, she will be at definitely at MMI from my standpoint. She is already MMI from a spine surgery standpoint per Dr. Shon Baton and is still getting treatment from Dr. Melvyn Novas in regards to her left upper extremity.  Discussed with the patient and caseworker or case manager Tina Griffiths, RN.     Erick Colace, M.D. Electronically Signed    AEK/MedQ D:  11/05/2010 14:52:34  T:  11/05/2010 23:11:04  Job #:  [id number]

## 2010-11-05 NOTE — Assessment & Plan Note (Signed)
REASON FOR VISIT:  Exacerbation of low back pain.  A 65 year old female with history of work-related fall bilateral wrist fractures as well as lumbar strain.  She has been treated for myofascial pain disorder, responded well to acupuncture as well as TENS unit. Continues to use the TENS unit.  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 She states that she was doing a lot of filing 3 days ago on a Friday when she developed increasing low back pain.  She was glad she did not have to work over the weekend because she stayed in bed, used her TENS as well as heat.  She is feeling back to her baseline today.  She spoke to her therapist who actually works at the same physical therapy office and was told that this is likely due to not using a stool as well as not pacing herself.  She has no lower extremity pain.  She has no bowel or bladder dysfunction.  Average pain is about 2 right now and some of this comes from her elbow.  She is employed to 7 hours a day.  She needs help with certain household duties and shopping, but otherwise independent. Tremor, tingling, trouble walking, spasms, dizziness on review of systems, has had high blood sugar.  Blood pressure 132/71, pulse 72, respiratory rate is 16 and O2 sat 96% on room air.  General, no acute distress.  Mood and affect appropriate. Her back has no tenderness to palpation.  Straight leg raising test is negative.  Lower extremity strength is normal.  Hip internal-external rotation is normal.  IMPRESSION:  Chronic low back pain.  Thoracolumbar myofascial pain with some exacerbation which has now resolved from 3 days ago.  PLAN:  Continue using TENS unit on a daily basis.  She has also used heat at the end of the day on a p.r.n. basis.  In addition, if she does have a flare-up in the future I think she would do fine with ibuprofen 4- 600 mg t.i.d. with food for 2-3 days.   Discussed with the patient, agrees with plan.  I will see her back in 3 months.  She will follow up with Dr. Melvyn Novas IV in regards to left upper extremity.  Discussed with the patient, agrees with plan.     Erick Colace, M.D. Electronically Signed    AEK/MedQ D:  11/05/2010 14:51:02  T:  11/05/2010 23:07:18  Job #:  [id number]  cc:   Tina Griffiths, M.D.  Madelynn Done, MD Fax: [phone]

## 2010-11-07 ENCOUNTER — Ambulatory Visit: Payer: PRIVATE HEALTH INSURANCE | Admitting: Occupational Therapy

## 2010-11-09 ENCOUNTER — Ambulatory Visit: Payer: PRIVATE HEALTH INSURANCE | Admitting: Occupational Therapy

## 2010-11-13 ENCOUNTER — Encounter: Payer: Self-pay | Admitting: Occupational Therapy

## 2010-11-14 ENCOUNTER — Ambulatory Visit: Payer: PRIVATE HEALTH INSURANCE | Admitting: Occupational Therapy

## 2010-11-15 ENCOUNTER — Ambulatory Visit: Payer: PRIVATE HEALTH INSURANCE | Admitting: Occupational Therapy

## 2010-11-17 DIAGNOSIS — M7500 Adhesive capsulitis of unspecified shoulder: Secondary | ICD-10-CM

## 2010-11-17 HISTORY — DX: Adhesive capsulitis of unspecified shoulder: M75.00

## 2010-11-20 ENCOUNTER — Ambulatory Visit: Payer: PRIVATE HEALTH INSURANCE | Attending: Orthopedic Surgery | Admitting: Occupational Therapy

## 2010-11-20 DIAGNOSIS — M2550 Pain in unspecified joint: Secondary | ICD-10-CM | POA: Insufficient documentation

## 2010-11-20 DIAGNOSIS — M6281 Muscle weakness (generalized): Secondary | ICD-10-CM | POA: Insufficient documentation

## 2010-11-20 DIAGNOSIS — IMO0001 Reserved for inherently not codable concepts without codable children: Secondary | ICD-10-CM | POA: Insufficient documentation

## 2010-11-20 DIAGNOSIS — M2560 Stiffness of unspecified joint, not elsewhere classified: Secondary | ICD-10-CM | POA: Insufficient documentation

## 2010-11-22 ENCOUNTER — Ambulatory Visit: Payer: PRIVATE HEALTH INSURANCE | Admitting: Occupational Therapy

## 2010-11-26 ENCOUNTER — Ambulatory Visit: Payer: Self-pay | Admitting: Rehabilitative and Restorative Service Providers"

## 2010-11-27 ENCOUNTER — Ambulatory Visit: Payer: PRIVATE HEALTH INSURANCE | Admitting: Occupational Therapy

## 2010-11-28 ENCOUNTER — Other Ambulatory Visit (HOSPITAL_COMMUNITY): Payer: Self-pay | Admitting: Orthopedic Surgery

## 2010-11-28 ENCOUNTER — Encounter: Payer: Self-pay | Admitting: Occupational Therapy

## 2010-11-28 DIAGNOSIS — M75100 Unspecified rotator cuff tear or rupture of unspecified shoulder, not specified as traumatic: Secondary | ICD-10-CM

## 2010-11-30 ENCOUNTER — Encounter: Payer: Self-pay | Admitting: Occupational Therapy

## 2010-12-04 ENCOUNTER — Ambulatory Visit (HOSPITAL_COMMUNITY)
Admission: RE | Admit: 2010-12-04 | Discharge: 2010-12-04 | Disposition: A | Discharge status: Home / Self Care | Payer: PRIVATE HEALTH INSURANCE | Source: Ambulatory Visit | Attending: Orthopedic Surgery | Admitting: Orthopedic Surgery

## 2010-12-04 ENCOUNTER — Encounter: Payer: Self-pay | Admitting: Occupational Therapy

## 2010-12-04 DIAGNOSIS — M75100 Unspecified rotator cuff tear or rupture of unspecified shoulder, not specified as traumatic: Secondary | ICD-10-CM

## 2010-12-04 DIAGNOSIS — M719 Bursopathy, unspecified: Secondary | ICD-10-CM | POA: Insufficient documentation

## 2010-12-04 DIAGNOSIS — M67919 Unspecified disorder of synovium and tendon, unspecified shoulder: Secondary | ICD-10-CM | POA: Insufficient documentation

## 2010-12-04 DIAGNOSIS — M25519 Pain in unspecified shoulder: Secondary | ICD-10-CM | POA: Insufficient documentation

## 2010-12-04 DIAGNOSIS — M25419 Effusion, unspecified shoulder: Secondary | ICD-10-CM | POA: Insufficient documentation

## 2010-12-07 ENCOUNTER — Encounter: Payer: Self-pay | Admitting: Occupational Therapy

## 2010-12-10 ENCOUNTER — Ambulatory Visit: Payer: Self-pay | Admitting: Rehabilitative and Restorative Service Providers"

## 2010-12-17 ENCOUNTER — Ambulatory Visit: Payer: Self-pay | Admitting: Rehabilitative and Restorative Service Providers"

## 2011-01-29 ENCOUNTER — Ambulatory Visit: Payer: Self-pay | Admitting: Physical Medicine & Rehabilitation

## 2011-02-01 ENCOUNTER — Ambulatory Visit: Payer: Self-pay | Admitting: Physical Medicine & Rehabilitation

## 2011-02-15 ENCOUNTER — Encounter: Payer: PRIVATE HEALTH INSURANCE | Attending: Physical Medicine & Rehabilitation

## 2011-02-15 ENCOUNTER — Ambulatory Visit: Payer: PRIVATE HEALTH INSURANCE | Admitting: Physical Medicine & Rehabilitation

## 2011-02-15 DIAGNOSIS — M4600 Spinal enthesopathy, site unspecified: Secondary | ICD-10-CM

## 2011-02-15 DIAGNOSIS — IMO0001 Reserved for inherently not codable concepts without codable children: Secondary | ICD-10-CM | POA: Insufficient documentation

## 2011-02-15 DIAGNOSIS — M545 Low back pain, unspecified: Secondary | ICD-10-CM | POA: Insufficient documentation

## 2011-02-15 DIAGNOSIS — M719 Bursopathy, unspecified: Secondary | ICD-10-CM | POA: Insufficient documentation

## 2011-02-15 DIAGNOSIS — M67919 Unspecified disorder of synovium and tendon, unspecified shoulder: Secondary | ICD-10-CM | POA: Insufficient documentation

## 2011-02-15 NOTE — Assessment & Plan Note (Signed)
A 65 year old female whom I am treating for back pain.  She has a history of a myofascial pain disorder, responded well to acupuncture and TENS unit.  She is still using the TENS unit up to 8 hours a day.  She had 1 episode of exacerbation of pain last August and then again around Thanksgiving.  She has had some food-related symptoms.  She states that if she eats too much she does have some pain which sounds like it is both in her abdomen as well as her low back and has seen her primary care on this.  She has had some left shoulder tendonitis which is not currently being treated under the workers comp system and has seen Dr. Teressa Senter for this.  She continues to see Dr. Bradly Bienenstock for problems related to her prior work injury, the same one that resulted in her back problems.  These were bilateral wrist fractures.  She has had no other new problems in the interval time.  Has had some weight loss.  Noted some decreased appetite but overall is happy that she lost some weight and as I indicated to her, she needs to continue follow with primary physician if she has any GI disturbance.  Pain level today is 4/10.  It has gotten as high as 8 in the past couple of weeks.  Someday she does take Robaxin.  Her pain is described as aching.  Her sleep is good.  Pain is worse with bending and standing, improves with rest, heat, therapy, exercise, moist heat, TENS unit, medications.  Currently employed 40 hours a week at [employer].  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  REVIEW OF SYSTEMS:  Spasms.  ALLERGIES:  REGULAR TENS PAD, now she uses hypoallergenic.  PHYSICAL EXAMINATION:  VITAL SIGNS:  Blood pressure 119/53, pulse 86, respiratory rate 16, and O2 saturation 94% on room air. GENERAL:  No acute distress.  Mood and affect appropriate. MUSCULOSKELETAL:  Her back has no tenderness to palpation.  She has good range of motion of the lumbar  spine.  Straight leg raising test is negative.  Lower extremity strength is normal.  Deep tendon reflexes in lower extremities are normal.  Mood and affect are appropriate.  IMPRESSION:  Low back pain, thoracolumbar myofascial pain, improved overall.  I will see her back in another 4 months.  She will use p.r.n. Robaxin.  She will continues TENS unit up to 8 hours a day.  If she remains stable at that point, I think she will be at maximal medical improvement from my standpoint, but we will defer to Dr. Melvyn Novas for any work restrictions.  I do not have her on any work restrictions from my standpoint.     Erick Colace, M.D. Electronically Signed    AEK/MedQ D:  02/15/2011 14:09:31  T:  02/15/2011 23:15:20  Job #:  [id number]  cc:   Tina Griffiths Medical Case Manager [phone]  Madelynn Done, MD Fax: [phone]

## 2011-03-24 ENCOUNTER — Emergency Department (INDEPENDENT_AMBULATORY_CARE_PROVIDER_SITE_OTHER): Payer: Worker's Compensation

## 2011-03-24 ENCOUNTER — Encounter: Payer: Self-pay | Admitting: *Deleted

## 2011-03-24 ENCOUNTER — Emergency Department (HOSPITAL_COMMUNITY)
Admission: EM | Admit: 2011-03-24 | Discharge: 2011-03-24 | Disposition: A | Discharge status: Home / Self Care | Payer: 59 | Source: Home / Self Care | Attending: Emergency Medicine | Admitting: Emergency Medicine

## 2011-03-24 DIAGNOSIS — M719 Bursopathy, unspecified: Secondary | ICD-10-CM

## 2011-03-24 DIAGNOSIS — M67919 Unspecified disorder of synovium and tendon, unspecified shoulder: Secondary | ICD-10-CM

## 2011-03-24 DIAGNOSIS — M7580 Other shoulder lesions, unspecified shoulder: Secondary | ICD-10-CM

## 2011-03-24 HISTORY — DX: Radial styloid tenosynovitis (de quervain): M65.4

## 2011-03-24 HISTORY — DX: Pure hypercholesterolemia, unspecified: E78.00

## 2011-03-24 HISTORY — DX: Sarcoidosis, unspecified: D86.9

## 2011-03-24 HISTORY — DX: Reserved for concepts with insufficient information to code with codable children: IMO0002

## 2011-03-24 HISTORY — DX: Essential (primary) hypertension: I10

## 2011-03-24 MED ORDER — MELOXICAM 7.5 MG PO TABS: 7.5000 mg | ORAL_TABLET | Freq: Every day | ORAL | Status: DC

## 2011-03-24 MED ORDER — HYDROCODONE-ACETAMINOPHEN 5-325 MG PO TABS: ORAL_TABLET | ORAL | Status: AC

## 2011-03-24 NOTE — ED Provider Notes (Signed)
Chief Complaint  Patient presents with  . Shoulder Pain  . Headache  . Neck Pain  . Chest Pain    History of Present Illness:  [name] is a 66 year old female with a long history of orthopedic problems. 2 years ago she fell at work and fractured both wrists. She's been seen by Dr. Melvyn Novas and Dr. Teressa Senter since then she also has degenerative disc disease of the lumbar spine and has had a frozen shoulder of her left shoulder. She just finished physical therapy for this under the care of Dr. Teressa Senter. Yesterday she was raising up her left arm to do some exercise and she felt a click and a pop and ever since then she's had pain in her upper shoulder which radiates widely into her neck, upper back, deltoid area, and upper arm. She's had some headache due to the neck pain. Right shoulder is fine. She states it hurts to take a deep breath and she's noticed some swelling in the upper back. She denies any numbness or tingling in the right arm.  Review of Systems:  Other than noted above, the patient denies any of the following symptoms: Systemic:  No fevers, chills, sweats, or aches.  No fatigue or tiredness. Musculoskeletal:  No joint pain, arthritis, bursitis, swelling, back pain, or neck pain. Neurological:  No muscular weakness, paresthesias, headache, or trouble with speech or coordination.  No dizziness.   PMFSH:  Past medical history, family history, social history, meds, and allergies were reviewed.  Physical Exam:   Vital signs:  BP 101/57  Pulse 83  Temp(Src) 98.4 F (36.9 C) (Oral)  Resp 18  SpO2 99% Gen:  Alert and oriented times 3.  In no distress. Musculoskeletal: Exam of the shoulder reveals diffuse pain to palpation laterally, superiorly, posteriorly, and anteriorly. There is no swelling or obvious deformity. The shoulder has a very good range of motion both actively and passively but with pain on abduction, flexion, and internal and external rotation. Muscle strength was normal.  Pulses were full. Sensation was intact.  Otherwise, all joints had a full a ROM with no swelling, bruising or deformity.  No edema, pulses full. Extremities were warm and pink.  Capillary refill was brisk.  Skin:  Clear, warm and dry.  No rash. Neuro:  Alert and oriented times 3.  Muscle strength was normal.  Sensation was intact to light touch.   Labs:  None    Radiology:  Dg Chest 2 View  03/24/2011  *RADIOLOGY REPORT*  Clinical Data: Neck/shoulder pain  CHEST - 2 VIEW  Comparison: 07/05/2009  Findings: Lungs are clear. No pleural effusion or pneumothorax.  The heart is top normal in size.  Degenerative changes with thoracolumbar scoliosis.  IMPRESSION: No evidence of acute cardiopulmonary disease.  Original Report Authenticated By: Charline Bills, M.D.   Dg Shoulder Left  03/24/2011  *RADIOLOGY REPORT*  Clinical Data: Left neck, shoulder and chest pain.  History of falling at work.  LEFT SHOULDER - 2+ VIEW  Comparison: Left shoulder MR dated 12/04/2010.  Findings: Minimal inferior glenohumeral spur formation.  Minimal inferior acromioclavicular spur formation.  No fracture or dislocation.  IMPRESSION: Minimal degenerative changes.  Original Report Authenticated By: Darrol Angel, M.D.    Medications given in [facility]:  None  Assessment:   Diagnoses that have been ruled out:  Diagnoses that are still under consideration:  Final diagnoses:  Rotator cuff tendonitis    Plan:   1.  The following meds were prescribed:  New Prescriptions   HYDROCODONE-ACETAMINOPHEN (NORCO) 5-325 MG PER TABLET    1 to 2 tabs every 4 to 6 hours as needed for pain.   MELOXICAM (MOBIC) 7.5 MG TABLET    Take 1 tablet (7.5 mg total) by mouth daily.   2.  The patient was instructed in symptomatic care, including rest and activity, elevation, application of ice and compression.  Appropriate handouts were given. 3.  The patient was told to return if becoming worse in any way, if no better in 3 or 4 days, and given some  red flag symptoms that would indicate earlier return.   4.  The patient was told to follow up with Dr. Teressa Senter next week. In the meantime she was given exercises to do and should apply ice afterwards.   Roque Lias, MD 03/24/11 1420

## 2011-03-24 NOTE — ED Notes (Signed)
Pt with c/o onset of left shoulder pain per pt lying in bed lifted left arm heard a click - onset of more severe pain yesterday - pt now with c/o pain radiating left shoulder to left arm to left side of neck worse with movement - pt also with c/o pai with deep breath chest - talking in complete sentences

## 2011-05-31 ENCOUNTER — Other Ambulatory Visit: Payer: Self-pay | Admitting: *Deleted

## 2011-05-31 ENCOUNTER — Ambulatory Visit (HOSPITAL_BASED_OUTPATIENT_CLINIC_OR_DEPARTMENT_OTHER): Payer: Worker's Compensation | Admitting: Physical Medicine & Rehabilitation

## 2011-05-31 ENCOUNTER — Encounter: Payer: Self-pay | Admitting: Physical Medicine & Rehabilitation

## 2011-05-31 ENCOUNTER — Encounter: Payer: PRIVATE HEALTH INSURANCE | Attending: Physical Medicine & Rehabilitation

## 2011-05-31 VITALS — BP 127/64 | HR 82 | Resp 16 | Ht 65.0 in | Wt 158.8 lb

## 2011-05-31 DIAGNOSIS — IMO0001 Reserved for inherently not codable concepts without codable children: Secondary | ICD-10-CM

## 2011-05-31 DIAGNOSIS — M545 Low back pain, unspecified: Secondary | ICD-10-CM | POA: Insufficient documentation

## 2011-05-31 DIAGNOSIS — M533 Sacrococcygeal disorders, not elsewhere classified: Secondary | ICD-10-CM

## 2011-05-31 DIAGNOSIS — G8929 Other chronic pain: Secondary | ICD-10-CM | POA: Insufficient documentation

## 2011-05-31 DIAGNOSIS — M79609 Pain in unspecified limb: Secondary | ICD-10-CM | POA: Insufficient documentation

## 2011-05-31 DIAGNOSIS — M7918 Myalgia, other site: Secondary | ICD-10-CM | POA: Insufficient documentation

## 2011-05-31 DIAGNOSIS — M549 Dorsalgia, unspecified: Secondary | ICD-10-CM | POA: Insufficient documentation

## 2011-05-31 MED ORDER — MELOXICAM 7.5 MG PO TABS: 7.5000 mg | ORAL_TABLET | Freq: Every day | ORAL | Status: DC

## 2011-05-31 NOTE — Patient Instructions (Signed)
Sacroiliac Joint Dysfunction The sacroiliac joint connects the lower part of the spine (the sacrum) with the bones of the pelvis. CAUSES  Sometimes, there is no obvious reason for sacroiliac joint dysfunction. Other times, it may occur   During pregnancy.   After injury, such as:   Car accidents.   Sport-related injuries.   Work-related injuries.   Due to one leg being shorter than the other.   Due to other conditions that affect the joints, such as:   Rheumatoid arthritis.   Gout.   Psoriasis.   Joint infection (septic arthritis).  SYMPTOMS  Symptoms may include:  Pain in the:   Lower back.   Buttocks.   Groin.   Thighs and legs.   Difficult sitting, standing, walking, lying, bending or lifting.  DIAGNOSIS  A number of tests may be used to help diagnose the cause of sacroiliac joint dysfunction, including:  Imaging tests to look for other causes of pain, including:   MRI.   CT scan.   Bone scan.   Diagnostic injection: During a special x-ray (called fluoroscopy), a needle is put into the sacroiliac joint. A numbing medicine is injected into the joint. If the pain is improved or stopped, the diagnosis of sacroiliac joint dysfunction is more likely.  TREATMENT  There are a number of types of treatment used for sacroiliac joint dysfunction, including:  Only take over-the-counter or prescription medicines for pain, discomfort, or fever as directed by your caregiver.   Medications to relax muscles.   Rest. Decreasing activity can help cut down on painful muscle spasms and allow the back to heal.   Application of heat or ice to the lower back may improve muscle spasms and soothe pain.   Brace. A special back brace, called a sacroiliac belt, can help support the joint while your back is healing.   Physical therapy can help teach comfortable positions and exercises to strengthen muscles that support the sacroiliac joint.   Cortisone injections. Injections  of steroid medicine into the joint can help decrease swelling and improve pain.   Hyaluronic acid injections. This chemical improves lubrication within the sacroiliac joint, thereby decreasing pain.   Radiofrequency ablation. A special needle is placed into the joint, where it burns away nerves that are carrying pain messages from the joint.   Surgery. Because pain occurs during movement of the joint, screws and plates may be installed in order to limit or prevent joint motion.  HOME CARE INSTRUCTIONS   Take all medications exactly as directed.   Follow instructions regarding both rest and physical activity, to avoid worsening the pain.   Do physical therapy exercises exactly as prescribed.  SEEK IMMEDIATE MEDICAL CARE IF:  You experience increasingly severe pain.   You develop new symptoms, such as numbness or tingling in your legs or feet.   You lose bladder or bowel control.  Document Released: 05/31/2008 Document Revised: 02/21/2011 Document Reviewed: 05/31/2008 [employer] Patient Information 2012 [employer]. 

## 2011-05-31 NOTE — Progress Notes (Addendum)
Subjective:    Patient ID: Sara [name], female    DOB: May 09, 1945, 66 y.o.   MRN: [id number]  HPI Stat on a hard wooden chair for approximately 4 hours about a month ago which resulted a been a flareup of her back pain and some bilateral lower extremity pain as well. Was doing very well prior to sitting on the wooden chair and in fact had stopped using her TENS unit. She has resumed using her TENS unit and feels that it is helping her. Also using heat and using Tylenol and takes tramadol at work. Pain Inventory Average Pain 6 Pain Right Now 6 My pain is constant, sharp, dull and aching  In the last 24 hours, has pain interfered with the following? General activity 10 Relation with others 10 Enjoyment of life 10 What TIME of day is your pain at its worst? evening and night Sleep (in general) Fair  Pain is worse with: bending, sitting, standing and some activites Pain improves with: heat/ice, therapy/exercise, medication and TENS Relief from Meds: 8  Mobility walk without assistance ability to climb steps?  yes do you drive?  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  yes  Neuro/Psych spasms dizziness  Prior Studies Any changes since last visit?  no  Physicians involved in your care Primary care Dr Oneta Rack Orthopedist Dr Teressa Senter for finger and shoulder      Review of Systems  Constitutional: Positive for unexpected weight change.       Weight loss  Musculoskeletal: Positive for back pain.       Some swelling in the upper extremity related to her frozen shoulder/ spasms in back  Neurological: Positive for dizziness.  All other systems reviewed and are negative.       Objective:   Physical Exam  Constitutional: She is oriented to person, place, and time. She appears well-developed and well-nourished.    Musculoskeletal:       Lumbar back: She exhibits tenderness. She exhibits no bony tenderness, no edema, no deformity and no spasm.       Back:  Neurological: She is alert and oriented to person, place, and time. She has normal strength. A sensory deficit is present. Gait abnormal.  Reflex Scores:      Tricep reflexes are 2+ on the right side and 2+ on the left side.      Bicep reflexes are 2+ on the right side and 2+ on the left side.      Brachioradialis reflexes are 2+ on the right side and 2+ on the left side.      Patellar reflexes are 2+ on the right side and 2+ on the left side.      Achilles reflexes are 2+ on the right side and 2+ on the left side.         Assessment & Plan:  1. Chronic low back pain with myofascial pain. She also has a history of sacroiliac pain and has responded to injections with 100% relief in the past. I believe she has flared up her sacroiliac pain but it is already improving. Rather than doing another injection I will prescribe Mobic 7.5 mg x2 weeks and see her back in one month. If she is not back to her usual 3 or 4/10 baseline pain we may consider sacroiliac injection once again. I do  not have any restrictions for her sedentary job.  I met with her case Charity fundraiser and discussed the above. The patient was in the room when we had our discussion. She may continue with heat as well as TENS unit use.

## 2011-06-13 ENCOUNTER — Ambulatory Visit: Payer: 59 | Admitting: Physical Medicine & Rehabilitation

## 2011-06-14 ENCOUNTER — Ambulatory Visit: Payer: Self-pay | Admitting: Physical Medicine & Rehabilitation

## 2011-06-25 ENCOUNTER — Other Ambulatory Visit (HOSPITAL_COMMUNITY): Payer: Self-pay | Admitting: Internal Medicine

## 2011-06-25 ENCOUNTER — Ambulatory Visit (HOSPITAL_COMMUNITY)
Admission: RE | Admit: 2011-06-25 | Discharge: 2011-06-25 | Disposition: A | Discharge status: Home / Self Care | Payer: 59 | Source: Ambulatory Visit | Attending: Internal Medicine | Admitting: Internal Medicine

## 2011-06-25 DIAGNOSIS — R071 Chest pain on breathing: Secondary | ICD-10-CM

## 2011-06-25 DIAGNOSIS — M25519 Pain in unspecified shoulder: Secondary | ICD-10-CM

## 2011-06-25 DIAGNOSIS — I10 Essential (primary) hypertension: Secondary | ICD-10-CM | POA: Insufficient documentation

## 2011-06-25 DIAGNOSIS — R079 Chest pain, unspecified: Secondary | ICD-10-CM | POA: Insufficient documentation

## 2011-06-25 DIAGNOSIS — M47812 Spondylosis without myelopathy or radiculopathy, cervical region: Secondary | ICD-10-CM | POA: Insufficient documentation

## 2011-07-01 ENCOUNTER — Ambulatory Visit: Payer: Worker's Compensation | Admitting: Physical Medicine & Rehabilitation

## 2011-07-02 ENCOUNTER — Ambulatory Visit: Payer: Worker's Compensation | Admitting: Physical Medicine & Rehabilitation

## 2011-07-02 ENCOUNTER — Other Ambulatory Visit (HOSPITAL_COMMUNITY): Payer: Self-pay | Admitting: Orthopedic Surgery

## 2011-07-02 DIAGNOSIS — M542 Cervicalgia: Secondary | ICD-10-CM

## 2011-07-04 ENCOUNTER — Ambulatory Visit (HOSPITAL_COMMUNITY)
Admission: RE | Admit: 2011-07-04 | Discharge: 2011-07-04 | Disposition: A | Discharge status: Home / Self Care | Payer: 59 | Source: Ambulatory Visit | Attending: Orthopedic Surgery | Admitting: Orthopedic Surgery

## 2011-07-04 DIAGNOSIS — Z9181 History of falling: Secondary | ICD-10-CM | POA: Insufficient documentation

## 2011-07-04 DIAGNOSIS — M5030 Other cervical disc degeneration, unspecified cervical region: Secondary | ICD-10-CM | POA: Insufficient documentation

## 2011-07-04 DIAGNOSIS — M542 Cervicalgia: Secondary | ICD-10-CM

## 2011-07-23 ENCOUNTER — Other Ambulatory Visit: Payer: Self-pay | Admitting: Neurosurgery

## 2011-07-24 ENCOUNTER — Encounter (HOSPITAL_COMMUNITY): Payer: Self-pay | Admitting: Pharmacy Technician

## 2011-07-25 ENCOUNTER — Ambulatory Visit: Payer: Worker's Compensation | Admitting: Physical Medicine & Rehabilitation

## 2011-07-26 ENCOUNTER — Encounter (HOSPITAL_COMMUNITY): Payer: Self-pay

## 2011-07-26 ENCOUNTER — Other Ambulatory Visit (HOSPITAL_COMMUNITY): Payer: Self-pay

## 2011-07-26 ENCOUNTER — Encounter (HOSPITAL_COMMUNITY)
Admission: RE | Admit: 2011-07-26 | Discharge: 2011-07-26 | Disposition: A | Discharge status: Home / Self Care | Payer: 59 | Source: Ambulatory Visit | Attending: Neurosurgery | Admitting: Neurosurgery

## 2011-07-26 HISTORY — DX: Cervicalgia: M54.2

## 2011-07-26 HISTORY — DX: Personal history of other infectious and parasitic diseases: Z86.19

## 2011-07-26 HISTORY — DX: Insomnia, unspecified: G47.00

## 2011-07-26 HISTORY — DX: Diverticulitis of intestine, part unspecified, without perforation or abscess without bleeding: K57.92

## 2011-07-26 HISTORY — DX: Headache: R51

## 2011-07-26 HISTORY — DX: Gastro-esophageal reflux disease without esophagitis: K21.9

## 2011-07-26 HISTORY — DX: Other chronic pain: G89.29

## 2011-07-26 HISTORY — DX: Chronic obstructive pulmonary disease, unspecified: J44.9

## 2011-07-26 HISTORY — DX: Dizziness and giddiness: R42

## 2011-07-26 HISTORY — DX: Bronchitis, not specified as acute or chronic: J40

## 2011-07-26 HISTORY — DX: Allergic rhinitis due to pollen: J30.1

## 2011-07-26 LAB — CBC
HCT: 37.5 % (ref 36.0–46.0)
Hemoglobin: 12.3 g/dL (ref 12.0–15.0)
MCHC: 32.8 g/dL (ref 30.0–36.0)
RBC: 4.24 MIL/uL (ref 3.87–5.11)

## 2011-07-26 LAB — BASIC METABOLIC PANEL
BUN: 10 mg/dL (ref 6–23)
CO2: 27 mEq/L (ref 19–32)
Calcium: 10 mg/dL (ref 8.4–10.5)
Chloride: 106 mEq/L (ref 96–112)
Creatinine, Ser: 0.83 mg/dL (ref 0.50–1.10)
Glucose, Bld: 90 mg/dL (ref 70–99)

## 2011-07-26 LAB — SURGICAL PCR SCREEN
MRSA, PCR: NEGATIVE
Staphylococcus aureus: NEGATIVE

## 2011-07-26 NOTE — Pre-Procedure Instructions (Signed)
22 Sara [name]  07/26/2011   Your procedure is scheduled on:  Wed, May 15 @ 10:45 AM  Report to [facility] at 7:45 AM.  Call this number if you have problems the morning of surgery: [phone]   Remember:   Do not eat food:After Midnight.  May have clear liquids: up to 4 Hours before arrival.(until 3:45 am)  Clear liquids include soda, tea, black coffee, apple or grape juice, broth,water  Take these medicines the morning of surgery with A SIP OF WATER: Eye Drops and Pain Pill(if needed)  Do not wear jewelry, make-up or nail polish.  Do not wear lotions, powders, or perfumes.   Do not shave 48 hours prior to surgery.  Do not bring valuables to the hospital.  Contacts, dentures or bridgework may not be worn into surgery.  Leave suitcase in the car. After surgery it may be brought to your room.  For patients admitted to the hospital, checkout time is 11:00 AM the day of discharge.   Special Instructions: CHG Shower Use Special Wash: 1/2 bottle night before surgery and 1/2 bottle morning of surgery.   Please read over the following fact sheets that you were given: Pain Booklet, Coughing and Deep Breathing, MRSA Information and Surgical Site Infection Prevention

## 2011-07-26 NOTE — Progress Notes (Signed)
[facility]-treating for back issues for workers comp=-on hold until after neck surgery

## 2011-07-26 NOTE — Progress Notes (Signed)
Pt doesn't have a cardiologist  Echo/Stress test done >52yrs ago Denies ever having a heart cath  Medical Md is Dr.William Mc.Tonita Cong with [facility]

## 2011-07-30 MED ORDER — VANCOMYCIN HCL IN DEXTROSE 1-5 GM/200ML-% IV SOLN
1000.0000 mg | INTRAVENOUS | Status: AC
  Administered 2011-07-31: 1000 mg via INTRAVENOUS
  Filled 2011-07-30: qty 200

## 2011-07-31 ENCOUNTER — Ambulatory Visit (HOSPITAL_COMMUNITY)
Admission: RE | Admit: 2011-07-31 | Discharge: 2011-08-01 | Disposition: A | Discharge status: Home / Self Care | Payer: 59 | Source: Ambulatory Visit | Attending: Neurosurgery | Admitting: Neurosurgery

## 2011-07-31 ENCOUNTER — Ambulatory Visit (HOSPITAL_COMMUNITY): Payer: 59 | Admitting: Anesthesiology

## 2011-07-31 ENCOUNTER — Ambulatory Visit (HOSPITAL_COMMUNITY): Payer: 59

## 2011-07-31 ENCOUNTER — Encounter (HOSPITAL_COMMUNITY): Payer: Self-pay | Admitting: Anesthesiology

## 2011-07-31 ENCOUNTER — Encounter (HOSPITAL_COMMUNITY)
Admission: RE | Disposition: A | Discharge status: Home / Self Care | Payer: Self-pay | Source: Ambulatory Visit | Attending: Neurosurgery

## 2011-07-31 ENCOUNTER — Encounter (HOSPITAL_COMMUNITY): Payer: Self-pay | Admitting: *Deleted

## 2011-07-31 DIAGNOSIS — J4489 Other specified chronic obstructive pulmonary disease: Secondary | ICD-10-CM | POA: Insufficient documentation

## 2011-07-31 DIAGNOSIS — J449 Chronic obstructive pulmonary disease, unspecified: Secondary | ICD-10-CM | POA: Insufficient documentation

## 2011-07-31 DIAGNOSIS — E119 Type 2 diabetes mellitus without complications: Secondary | ICD-10-CM | POA: Insufficient documentation

## 2011-07-31 DIAGNOSIS — M47812 Spondylosis without myelopathy or radiculopathy, cervical region: Secondary | ICD-10-CM | POA: Insufficient documentation

## 2011-07-31 DIAGNOSIS — R51 Headache: Secondary | ICD-10-CM | POA: Insufficient documentation

## 2011-07-31 DIAGNOSIS — M5020 Other cervical disc displacement, unspecified cervical region: Secondary | ICD-10-CM | POA: Insufficient documentation

## 2011-07-31 DIAGNOSIS — K219 Gastro-esophageal reflux disease without esophagitis: Secondary | ICD-10-CM | POA: Insufficient documentation

## 2011-07-31 DIAGNOSIS — I10 Essential (primary) hypertension: Secondary | ICD-10-CM | POA: Insufficient documentation

## 2011-07-31 DIAGNOSIS — M5030 Other cervical disc degeneration, unspecified cervical region: Secondary | ICD-10-CM | POA: Insufficient documentation

## 2011-07-31 HISTORY — PX: ANTERIOR CERVICAL DECOMP/DISCECTOMY FUSION: SHX1161

## 2011-07-31 LAB — GLUCOSE, CAPILLARY
Glucose-Capillary: 110 mg/dL — ABNORMAL HIGH (ref 70–99)
Glucose-Capillary: 113 mg/dL — ABNORMAL HIGH (ref 70–99)
Glucose-Capillary: 122 mg/dL — ABNORMAL HIGH (ref 70–99)
Glucose-Capillary: 146 mg/dL — ABNORMAL HIGH (ref 70–99)

## 2011-07-31 SURGERY — ANTERIOR CERVICAL DECOMPRESSION/DISCECTOMY FUSION 3 LEVELS
Anesthesia: General | Site: Spine Cervical | Wound class: Clean

## 2011-07-31 MED ORDER — GENTAMICIN IN SALINE 1.6-0.9 MG/ML-% IV SOLN
80.0000 mg | INTRAVENOUS | Status: AC
  Filled 2011-07-31: qty 50

## 2011-07-31 MED ORDER — SODIUM CHLORIDE 0.9 % IV SOLN
INTRAVENOUS | Status: AC
  Filled 2011-07-31: qty 500

## 2011-07-31 MED ORDER — NEOSTIGMINE METHYLSULFATE 1 MG/ML IJ SOLN
INTRAMUSCULAR | Status: DC | PRN
  Administered 2011-07-31: 3 mg via INTRAVENOUS

## 2011-07-31 MED ORDER — THROMBIN 20000 UNITS EX KIT
PACK | CUTANEOUS | Status: DC | PRN
  Administered 2011-07-31: 12:00:00 via TOPICAL

## 2011-07-31 MED ORDER — BISACODYL 10 MG RE SUPP: 10.0000 mg | Freq: Every day | RECTAL | Status: DC | PRN

## 2011-07-31 MED ORDER — BENAZEPRIL-HYDROCHLOROTHIAZIDE 20-25 MG PO TABS: 0.5000 | ORAL_TABLET | Freq: Every day | ORAL | Status: DC

## 2011-07-31 MED ORDER — TRAVOPROST 0.004 % OP SOLN
1.0000 [drp] | Freq: Every day | OPHTHALMIC | Status: DC
Start: 2011-07-31 — End: 2011-07-31

## 2011-07-31 MED ORDER — LIDOCAINE-EPINEPHRINE 1 %-1:100000 IJ SOLN
INTRAMUSCULAR | Status: DC | PRN
  Administered 2011-07-31: 9 mL

## 2011-07-31 MED ORDER — PHENOL 1.4 % MT LIQD: 1.0000 | OROMUCOSAL | Status: DC | PRN

## 2011-07-31 MED ORDER — HYDROXYZINE HCL 50 MG/ML IM SOLN
50.0000 mg | INTRAMUSCULAR | Status: DC | PRN
  Administered 2011-07-31: 50 mg via INTRAMUSCULAR
  Filled 2011-07-31: qty 1

## 2011-07-31 MED ORDER — LACTATED RINGERS IV SOLN
INTRAVENOUS | Status: DC | PRN
  Administered 2011-07-31 (×3): via INTRAVENOUS

## 2011-07-31 MED ORDER — LORAZEPAM 2 MG/ML IJ SOLN: 1.0000 mg | Freq: Once | INTRAMUSCULAR | Status: DC | PRN

## 2011-07-31 MED ORDER — LORAZEPAM 1 MG PO TABS
1.0000 mg | ORAL_TABLET | Freq: Every day | ORAL | Status: DC
  Filled 2011-07-31: qty 2

## 2011-07-31 MED ORDER — HYDROXYZINE HCL 25 MG PO TABS: 50.0000 mg | ORAL_TABLET | ORAL | Status: DC | PRN

## 2011-07-31 MED ORDER — FENTANYL CITRATE 0.05 MG/ML IJ SOLN
50.0000 ug | INTRAMUSCULAR | Status: DC | PRN
Start: 2011-07-31 — End: 2011-07-31

## 2011-07-31 MED ORDER — METHOCARBAMOL 500 MG PO TABS: 500.0000 mg | ORAL_TABLET | Freq: Two times a day (BID) | ORAL | Status: DC | PRN

## 2011-07-31 MED ORDER — CYCLOBENZAPRINE HCL 10 MG PO TABS: 10.0000 mg | ORAL_TABLET | Freq: Three times a day (TID) | ORAL | Status: DC | PRN

## 2011-07-31 MED ORDER — LIDOCAINE HCL (CARDIAC) 20 MG/ML IV SOLN
INTRAVENOUS | Status: DC | PRN
  Administered 2011-07-31: 50 mg via INTRAVENOUS

## 2011-07-31 MED ORDER — TRAVOPROST (BAK FREE) 0.004 % OP SOLN
1.0000 [drp] | Freq: Every day | OPHTHALMIC | Status: DC
  Administered 2011-07-31: 1 [drp] via OPHTHALMIC
  Filled 2011-07-31: qty 2.5

## 2011-07-31 MED ORDER — SODIUM CHLORIDE 0.9 % IR SOLN
Status: DC | PRN
  Administered 2011-07-31: 12:00:00

## 2011-07-31 MED ORDER — FENTANYL CITRATE 0.05 MG/ML IJ SOLN
INTRAMUSCULAR | Status: DC | PRN
  Administered 2011-07-31 (×2): 100 ug via INTRAVENOUS
  Administered 2011-07-31: 50 ug via INTRAVENOUS

## 2011-07-31 MED ORDER — SODIUM CHLORIDE 0.9 % IJ SOLN: 3.0000 mL | INTRAMUSCULAR | Status: DC | PRN

## 2011-07-31 MED ORDER — MENTHOL 3 MG MT LOZG: 1.0000 | LOZENGE | OROMUCOSAL | Status: DC | PRN

## 2011-07-31 MED ORDER — GENTAMICIN IN SALINE 0.8-0.9 MG/ML-% IV SOLN
80.0000 mg | INTRAVENOUS | Status: DC
  Administered 2011-07-31: 80 mg via INTRAVENOUS
  Filled 2011-07-31: qty 100

## 2011-07-31 MED ORDER — MIDAZOLAM HCL 5 MG/5ML IJ SOLN
INTRAMUSCULAR | Status: DC | PRN
  Administered 2011-07-31: 2 mg via INTRAVENOUS

## 2011-07-31 MED ORDER — HYDROMORPHONE HCL PF 1 MG/ML IJ SOLN
INTRAMUSCULAR | Status: AC
  Filled 2011-07-31: qty 1

## 2011-07-31 MED ORDER — KETOROLAC TROMETHAMINE 30 MG/ML IJ SOLN
30.0000 mg | Freq: Four times a day (QID) | INTRAMUSCULAR | Status: DC
  Administered 2011-07-31 – 2011-08-01 (×3): 30 mg via INTRAVENOUS
  Filled 2011-07-31 (×7): qty 1

## 2011-07-31 MED ORDER — GLYCOPYRROLATE 0.2 MG/ML IJ SOLN
INTRAMUSCULAR | Status: DC | PRN
  Administered 2011-07-31: 0.6 mg via INTRAVENOUS

## 2011-07-31 MED ORDER — METFORMIN HCL ER 500 MG PO TB24
500.0000 mg | ORAL_TABLET | Freq: Every day | ORAL | Status: DC
  Administered 2011-07-31: 500 mg via ORAL
  Filled 2011-07-31 (×2): qty 1

## 2011-07-31 MED ORDER — MAGNESIUM HYDROXIDE 400 MG/5ML PO SUSP: 30.0000 mL | Freq: Every day | ORAL | Status: DC | PRN

## 2011-07-31 MED ORDER — KETOROLAC TROMETHAMINE 30 MG/ML IJ SOLN
30.0000 mg | Freq: Once | INTRAMUSCULAR | Status: AC
  Administered 2011-07-31: 30 mg via INTRAVENOUS

## 2011-07-31 MED ORDER — ACETAMINOPHEN 325 MG PO TABS: 650.0000 mg | ORAL_TABLET | ORAL | Status: DC | PRN

## 2011-07-31 MED ORDER — MIDAZOLAM HCL 2 MG/2ML IJ SOLN: 1.0000 mg | INTRAMUSCULAR | Status: DC | PRN

## 2011-07-31 MED ORDER — ALUM & MAG HYDROXIDE-SIMETH 200-200-20 MG/5ML PO SUSP: 30.0000 mL | Freq: Four times a day (QID) | ORAL | Status: DC | PRN

## 2011-07-31 MED ORDER — ACETAMINOPHEN 650 MG RE SUPP: 650.0000 mg | RECTAL | Status: DC | PRN

## 2011-07-31 MED ORDER — OXYCODONE-ACETAMINOPHEN 5-325 MG PO TABS
1.0000 | ORAL_TABLET | ORAL | Status: DC | PRN
  Administered 2011-08-01 (×2): 2 via ORAL
  Filled 2011-07-31 (×2): qty 2

## 2011-07-31 MED ORDER — SODIUM CHLORIDE 0.9 % IJ SOLN
3.0000 mL | Freq: Two times a day (BID) | INTRAMUSCULAR | Status: DC
  Administered 2011-08-01: 3 mL via INTRAVENOUS

## 2011-07-31 MED ORDER — PHENYLEPHRINE HCL 10 MG/ML IJ SOLN
INTRAMUSCULAR | Status: DC | PRN
  Administered 2011-07-31: 40 ug via INTRAVENOUS

## 2011-07-31 MED ORDER — ONDANSETRON HCL 4 MG/2ML IJ SOLN
INTRAMUSCULAR | Status: DC | PRN
  Administered 2011-07-31: 4 mg via INTRAVENOUS

## 2011-07-31 MED ORDER — PROPOFOL 10 MG/ML IV EMUL
INTRAVENOUS | Status: DC | PRN
  Administered 2011-07-31: 150 mg via INTRAVENOUS

## 2011-07-31 MED ORDER — ROCURONIUM BROMIDE 100 MG/10ML IV SOLN
INTRAVENOUS | Status: DC | PRN
  Administered 2011-07-31: 10 mg via INTRAVENOUS
  Administered 2011-07-31: 50 mg via INTRAVENOUS
  Administered 2011-07-31: 10 mg via INTRAVENOUS

## 2011-07-31 MED ORDER — MORPHINE SULFATE 4 MG/ML IJ SOLN
4.0000 mg | INTRAMUSCULAR | Status: DC | PRN
  Administered 2011-07-31: 4 mg via INTRAMUSCULAR
  Filled 2011-07-31: qty 1

## 2011-07-31 MED ORDER — KETOROLAC TROMETHAMINE 30 MG/ML IJ SOLN
INTRAMUSCULAR | Status: AC
  Filled 2011-07-31: qty 1

## 2011-07-31 MED ORDER — KCL IN DEXTROSE-NACL 20-5-0.45 MEQ/L-%-% IV SOLN
INTRAVENOUS | Status: DC
  Administered 2011-07-31: 19:00:00 via INTRAVENOUS
  Filled 2011-07-31 (×5): qty 1000

## 2011-07-31 MED ORDER — ZOLPIDEM TARTRATE 5 MG PO TABS: 10.0000 mg | ORAL_TABLET | Freq: Every evening | ORAL | Status: DC | PRN

## 2011-07-31 MED ORDER — BACITRACIN 50000 UNITS IM SOLR
INTRAMUSCULAR | Status: AC
  Filled 2011-07-31: qty 1

## 2011-07-31 MED ORDER — DORZOLAMIDE HCL 2 % OP SOLN
1.0000 [drp] | Freq: Two times a day (BID) | OPHTHALMIC | Status: DC
  Administered 2011-07-31 – 2011-08-01 (×2): 1 [drp] via OPHTHALMIC
  Filled 2011-07-31: qty 10

## 2011-07-31 MED ORDER — HYDROCHLOROTHIAZIDE 12.5 MG PO CAPS
12.5000 mg | ORAL_CAPSULE | Freq: Every day | ORAL | Status: DC
  Administered 2011-08-01: 12.5 mg via ORAL
  Filled 2011-07-31 (×2): qty 1

## 2011-07-31 MED ORDER — 0.9 % SODIUM CHLORIDE (POUR BTL) OPTIME
TOPICAL | Status: DC | PRN
  Administered 2011-07-31: 1000 mL

## 2011-07-31 MED ORDER — HYDROCODONE-ACETAMINOPHEN 5-325 MG PO TABS: 1.0000 | ORAL_TABLET | ORAL | Status: DC | PRN

## 2011-07-31 MED ORDER — ATORVASTATIN CALCIUM 20 MG PO TABS
20.0000 mg | ORAL_TABLET | Freq: Every day | ORAL | Status: DC
  Filled 2011-07-31 (×2): qty 1

## 2011-07-31 MED ORDER — HYDROMORPHONE HCL PF 1 MG/ML IJ SOLN
0.2500 mg | INTRAMUSCULAR | Status: DC | PRN
  Administered 2011-07-31 (×4): 0.5 mg via INTRAVENOUS

## 2011-07-31 MED ORDER — BUPIVACAINE HCL (PF) 0.5 % IJ SOLN
INTRAMUSCULAR | Status: DC | PRN
  Administered 2011-07-31: 9 mL

## 2011-07-31 MED ORDER — BETAXOLOL HCL 0.25 % OP SUSP
1.0000 [drp] | Freq: Two times a day (BID) | OPHTHALMIC | Status: DC
  Administered 2011-07-31 – 2011-08-01 (×2): 1 [drp] via OPHTHALMIC
  Filled 2011-07-31: qty 10

## 2011-07-31 MED ORDER — FAMOTIDINE 20 MG PO TABS
20.0000 mg | ORAL_TABLET | Freq: Two times a day (BID) | ORAL | Status: DC
  Administered 2011-08-01: 20 mg via ORAL
  Filled 2011-07-31 (×3): qty 1

## 2011-07-31 MED ORDER — BENAZEPRIL HCL 10 MG PO TABS
10.0000 mg | ORAL_TABLET | Freq: Every day | ORAL | Status: DC
  Administered 2011-08-01: 10 mg via ORAL
  Filled 2011-07-31 (×2): qty 1

## 2011-07-31 SURGICAL SUPPLY — 66 items
ADH SKN CLS APL DERMABOND .7 (GAUZE/BANDAGES/DRESSINGS) ×1
ADH SKN CLS LQ APL DERMABOND (GAUZE/BANDAGES/DRESSINGS) ×1
ALLOGRAFT CA 6X14X11 (Bone Implant) ×6 IMPLANT
BAG DECANTER FOR FLEXI CONT (MISCELLANEOUS) ×2 IMPLANT
BIT DRILL NEURO 2X3.1 SFT TUCH (MISCELLANEOUS) ×1 IMPLANT
BLADE ULTRA TIP 2M (BLADE) ×2 IMPLANT
BRUSH SCRUB EZ PLAIN DRY (MISCELLANEOUS) ×2 IMPLANT
CANISTER SUCTION 2500CC (MISCELLANEOUS) ×2 IMPLANT
CLOTH BEACON ORANGE TIMEOUT ST (SAFETY) ×2 IMPLANT
CONT SPEC 4OZ CLIKSEAL STRL BL (MISCELLANEOUS) ×2 IMPLANT
COVER MAYO STAND STRL (DRAPES) ×2 IMPLANT
DECANTER SPIKE VIAL GLASS SM (MISCELLANEOUS) ×1 IMPLANT
DERMABOND ADHESIVE PROPEN (GAUZE/BANDAGES/DRESSINGS) ×1
DERMABOND ADVANCED (GAUZE/BANDAGES/DRESSINGS) ×1
DERMABOND ADVANCED .7 DNX12 (GAUZE/BANDAGES/DRESSINGS) ×1 IMPLANT
DERMABOND ADVANCED .7 DNX6 (GAUZE/BANDAGES/DRESSINGS) ×1 IMPLANT
DRAPE LAPAROTOMY 100X72 PEDS (DRAPES) ×2 IMPLANT
DRAPE MICROSCOPE LEICA (MISCELLANEOUS) ×2 IMPLANT
DRAPE POUCH INSTRU U-SHP 10X18 (DRAPES) ×2 IMPLANT
DRAPE PROXIMA HALF (DRAPES) IMPLANT
DRILL NEURO 2X3.1 SOFT TOUCH (MISCELLANEOUS) ×2
ELECT COATED BLADE 2.86 ST (ELECTRODE) ×2 IMPLANT
ELECT REM PT RETURN 9FT ADLT (ELECTROSURGICAL) ×2
ELECTRODE REM PT RTRN 9FT ADLT (ELECTROSURGICAL) ×1 IMPLANT
GLOVE BIOGEL PI IND STRL 6.5 (GLOVE) IMPLANT
GLOVE BIOGEL PI IND STRL 8 (GLOVE) ×1 IMPLANT
GLOVE BIOGEL PI IND STRL 8.5 (GLOVE) IMPLANT
GLOVE BIOGEL PI INDICATOR 6.5 (GLOVE) ×1
GLOVE BIOGEL PI INDICATOR 8 (GLOVE) ×1
GLOVE BIOGEL PI INDICATOR 8.5 (GLOVE) ×1
GLOVE ECLIPSE 6.5 STRL STRAW (GLOVE) ×4 IMPLANT
GLOVE ECLIPSE 7.5 STRL STRAW (GLOVE) ×2 IMPLANT
GLOVE EXAM NITRILE LRG STRL (GLOVE) IMPLANT
GLOVE EXAM NITRILE MD LF STRL (GLOVE) ×1 IMPLANT
GLOVE EXAM NITRILE XL STR (GLOVE) IMPLANT
GLOVE EXAM NITRILE XS STR PU (GLOVE) IMPLANT
GLOVE SURG SS PI 8.0 STRL IVOR (GLOVE) ×2 IMPLANT
GOWN BRE IMP SLV AUR LG STRL (GOWN DISPOSABLE) ×1 IMPLANT
GOWN BRE IMP SLV AUR XL STRL (GOWN DISPOSABLE) ×1 IMPLANT
GOWN STRL REIN 2XL LVL4 (GOWN DISPOSABLE) ×1 IMPLANT
HEAD HALTER (SOFTGOODS) ×2 IMPLANT
KIT BASIN OR (CUSTOM PROCEDURE TRAY) ×2 IMPLANT
KIT ROOM TURNOVER OR (KITS) ×2 IMPLANT
NDL HYPO 25X1 1.5 SAFETY (NEEDLE) ×1 IMPLANT
NDL SPNL 20GX3.5 QUINCKE YW (NEEDLE) IMPLANT
NDL SPNL 22GX3.5 QUINCKE BK (NEEDLE) ×1 IMPLANT
NEEDLE HYPO 25X1 1.5 SAFETY (NEEDLE) ×2 IMPLANT
NEEDLE SPNL 20GX3.5 QUINCKE YW (NEEDLE) ×4 IMPLANT
NEEDLE SPNL 22GX3.5 QUINCKE BK (NEEDLE) IMPLANT
NS IRRIG 1000ML POUR BTL (IV SOLUTION) ×2 IMPLANT
PACK LAMINECTOMY NEURO (CUSTOM PROCEDURE TRAY) ×2 IMPLANT
PAD ARMBOARD 7.5X6 YLW CONV (MISCELLANEOUS) ×6 IMPLANT
PATTIES SURGICAL .5 X.5 (GAUZE/BANDAGES/DRESSINGS) ×1 IMPLANT
PATTIES SURGICAL 1X1 (DISPOSABLE) ×1 IMPLANT
RUBBERBAND STERILE (MISCELLANEOUS) ×4 IMPLANT
SPONGE INTESTINAL PEANUT (DISPOSABLE) ×2 IMPLANT
SPONGE SURGIFOAM ABS GEL 100 (HEMOSTASIS) ×2 IMPLANT
STAPLER SKIN PROX WIDE 3.9 (STAPLE) IMPLANT
SUT VIC AB 0 CT1 18XCR BRD8 (SUTURE) IMPLANT
SUT VIC AB 0 CT1 8-18 (SUTURE)
SUT VIC AB 2-0 CP2 18 (SUTURE) ×2 IMPLANT
SUT VIC AB 3-0 SH 8-18 (SUTURE) ×3 IMPLANT
SYR 20ML ECCENTRIC (SYRINGE) ×2 IMPLANT
TOWEL OR 17X24 6PK STRL BLUE (TOWEL DISPOSABLE) IMPLANT
TOWEL OR 17X26 10 PK STRL BLUE (TOWEL DISPOSABLE) ×2 IMPLANT
WATER STERILE IRR 1000ML POUR (IV SOLUTION) ×2 IMPLANT

## 2011-07-31 NOTE — Anesthesia Postprocedure Evaluation (Signed)
  Anesthesia Post-op Note  Patient: Sara [name]  Procedure(s) Performed: Procedure(s) (LRB): ANTERIOR CERVICAL DECOMPRESSION/DISCECTOMY FUSION 3 LEVELS (N/A)  Patient Location: PACU  Anesthesia Type: General  Level of Consciousness: awake  Airway and Oxygen Therapy: Patient Spontanous Breathing  Post-op Pain: mild  Post-op Assessment: Post-op Vital signs reviewed, Patient's Cardiovascular Status Stable, Respiratory Function Stable, Patent Airway, No signs of Nausea or vomiting and Pain level controlled  Post-op Vital Signs: stable  Complications: No apparent anesthesia complications

## 2011-07-31 NOTE — Anesthesia Preprocedure Evaluation (Signed)
Anesthesia Evaluation  Patient identified by MRN, date of birth, ID band Patient awake    Reviewed: Allergy & Precautions, H&P , NPO status , Patient's Chart, lab work & pertinent test results  Airway Mallampati: I TM Distance: >3 FB Neck ROM: Full    Dental   Pulmonary asthma , COPD + rhonchi         Cardiovascular hypertension,     Neuro/Psych  Headaches,  Neuromuscular disease    GI/Hepatic GERD-  ,  Endo/Other  Diabetes mellitus-  Renal/GU      Musculoskeletal   Abdominal   Peds  Hematology   Anesthesia Other Findings   Reproductive/Obstetrics                           Anesthesia Physical Anesthesia Plan  ASA: III  Anesthesia Plan: General   Post-op Pain Management:    Induction: Intravenous  Airway Management Planned: Oral ETT  Additional Equipment:   Intra-op Plan:   Post-operative Plan: Extubation in OR  Informed Consent: I have reviewed the patients History and Physical, chart, labs and discussed the procedure including the risks, benefits and alternatives for the proposed anesthesia with the patient or authorized representative who has indicated his/her understanding and acceptance.     Plan Discussed with: CRNA and Surgeon  Anesthesia Plan Comments:         Anesthesia Quick Evaluation

## 2011-07-31 NOTE — Op Note (Signed)
07/31/2011  2:36 PM  PATIENT:  Sara [name]  66 y.o. female  PRE-OPERATIVE DIAGNOSIS:  cervical herniated disc,cervical degenerative disc disease, cervical spondylosis, cervical radiculopathy  POST-OPERATIVE DIAGNOSIS:  cervical herniated disc,cervical degenerative disc disease, cervical spondylosis, cervical radiculopathy  PROCEDURE:  Procedure(s): ANTERIOR CERVICAL DECOMPRESSION/DISCECTOMY FUSION 3 LEVELS: C4-5, C5-6, and C6-7 anterior cervical decompression and arthrodesis with allograft and tether cervical plating  SURGEON:  Surgeon(s): Hewitt Shorts, MD Carmela Hurt, MD  ASSISTANTS: Coletta Memos, MD  ANESTHESIA:   general  EBL:  Total I/O In: 2000 [I.V.:2000] Out: 800 [Urine:600; Blood:200]  BLOOD ADMINISTERED:none  COUNT: Correct per nursing staff  DICTATION: Patient was brought to the operating room placed under general endotracheal anesthesia. Patient was placed in 10 pounds of halter traction. The neck was prepped with Betadine soap and solution and draped in a sterile fashion. A obliquel incision was made on the left side of the neck paralleling the anterior border of the sternocleidomastoid.. The line of the incision was infiltrated with local anesthetic with epinephrine. Dissection was carried down thru the subcutaneous tissue and platysma, bipolar cautery was used to maintain hemostasis. Dissection was then carried out thru an avascular plane leaving the sternocleidomastoid carotid artery and jugular vein laterally and the trachea and esophagus medially. The ventral aspect of the vertebral column was identified and a localizing x-ray was taken. The C4-5, C5-6, and C6-7 levels were identified. The annulus at each level was incised and the disc space entered. Discectomy was performed with micro-curettes and pituitary rongeurs. The operating microscope was draped and brought into the field provided additional magnification illumination and visualization. Discectomy was  continued posteriorly thru the disc space and then the cartilaginous endplate was removed using micro-curettes along with the high-speed drill. Posterior osteophytic overgrowth was removed at each level using the high-speed drill along with a 2 mm thin footplated Kerrison punch. Posterior longitudinal ligament along with disc herniation was carefully removed, decompressing the spinal canal and thecal sac. We then continued to remove osteophytic overgrowth and disc material decompressing the neural foramina and exiting nerve roots bilaterally. Once the decompression was completed hemostasis was established at each level with the use of Gelfoam with thrombin and bipolar cautery. The Gelfoam was removed the wound irrigated and hemostasis confirmed. We then measured the height of each intravertebral disc space level and selected a 6 millimeter in height structural allograft for each level. Each was hydrated in saline solution and then gently positioned in the intravertebral disc space and countersunk. We then selected a 43 millimeter in height Tether cervical plate. It was positioned over the fusion construct and secured to the vertebra with a pair of 4 x 12 mm variable screws at C4, a 4 x 12 mm variable screw at C5, a 4 x 13 mm fixed screw at C6, and a pair of 4 right 12 mm variable screws at C7.. Each screw hole was started with the high-speed drill and then the screws placed, once all the screws were placed final tightening was performed. The wound was irrigated with bacitracin solution checked for hemostasis which was established and confirmed. An x-ray was taken which showed grafts in good position, the plate and screws in good position, and the overall alignment to be good. The wounds checked once again for hemostasis. We then proceeded with closure. The platysma was closed with interrupted inverted 2-0 undyed Vicryl suture, the subcutaneous and subcuticular closed with interrupted inverted 3-0 undyed Vicryl  suture. The skin edges were approximated with  Dermabond. Following surgery the patient was taken out of cervical traction. To be reversed and the anesthetic and taken to the recovery room for further care.   PLAN OF CARE: Admit for overnight observation  PATIENT DISPOSITION:  PACU - hemodynamically stable.   Delay start of Pharmacological VTE agent (>24hrs) due to surgical blood loss or risk of bleeding:  yes

## 2011-07-31 NOTE — Anesthesia Procedure Notes (Signed)
Procedure Name: Intubation Date/Time: 07/31/2011 10:59 AM Performed by: Gwenyth Allegra Pre-anesthesia Checklist: Emergency Drugs available, Suction available, Patient being monitored, Patient identified and Timeout performed Patient Re-evaluated:Patient Re-evaluated prior to inductionOxygen Delivery Method: Circle system utilized Preoxygenation: Pre-oxygenation with 100% oxygen Intubation Type: IV induction Ventilation: Mask ventilation without difficulty and Oral airway inserted - appropriate to patient size Laryngoscope Size: Mac and 3 Grade View: Grade I Tube type: Oral Tube size: 7.0 mm Airway Equipment and Method: Stylet Placement Confirmation: ETT inserted through vocal cords under direct vision,  breath sounds checked- equal and bilateral and positive ETCO2 Secured at: 21 cm Tube secured with: Tape Dental Injury: Teeth and Oropharynx as per pre-operative assessment

## 2011-07-31 NOTE — Progress Notes (Signed)
Filed Vitals:   07/31/11 1513 07/31/11 1516 07/31/11 1527 07/31/11 1543  BP: 115/57   112/74  Pulse: 72 80 66 71  Temp:   97 F (36.1 C) 97.9 F (36.6 C)  TempSrc:      Resp: 28 15 37 22  SpO2: 99% 100% 98% 94%    Patient sitting on the side of the bed, she's been having some nausea, and has been given medication for the nausea by the staff. Wound is clean and dry. No underlying swelling or bruising. Patient wearing soft cervical collar. Foley recently DC'd, nursing staff to monitor voiding function. Moving all 4 extremities well. Patient has not yet in the halls.  Plan: Encouraged to ambulate in the halls. We'll continue postoperative care.  Hewitt Shorts, MD 07/31/2011, 7:01 PM

## 2011-07-31 NOTE — H&P (Signed)
Sara [name]   DOB:  1945-11-18   HISTORY OF PRESENT ILLNESS:  The patient is a 66 year old right-handed black female who is seen in neurosurgical consultation for evaluation of cervical spondylosis, degenerative disc disease and superimposed disc herniation.    The patient explains that her difficulties began a little over two years ago in April of 2011.  She apparently fell and broke her wrists bilaterally.  She required an ORIF of the left wrist fracture which was done by Dr. Myrtie Neither.  However, she continued to have difficulties and the injury was accepted under [employer] claim and she was set up by the [employer] carrier for further evaluation and care with Dr. Melvyn Novas.  Dr. Melvyn Novas diagnosed a left carpal tunnel syndrome and she was sent for second opinion consultation with Dr. Teressa Senter who did x-rays and found that one of the screws had loosened from the ORIF and he recommended Dr. Melvyn Novas remove the plates and screws when he was doing the carpal tunnel release.    Subsequent to Dr. Glenna Durand surgery, she explains she still had pain in the left hand extending back up into the left forearm and back up into the left arm.  She also was referred for physical therapy and subsequently had pain in and around the left shoulder.  She has also had some aching into the right shoulder and arm but overall the symptoms are worse to the left shoulder, arm, forearm and hand.    She continues to followup with Dr. Melvyn Novas and also went for another second opinion with Dr. Teressa Senter.     Her primary doctor is Dr. Lucky Cowboy and he referred her to Dr. Yevette Edwards for the question of evaluating her cervical spine and Dr. Yevette Edwards obtained x-rays and MRI and recommended a 3-level ACDF.  The patient recalls the levels as being C3-4, C4-5, and C6-7.  She is also followed by Dr. Wynn Banker for treatment of her low back pain that is a result of her Workman's Compensation injury.    Dr. Melvyn Novas  apparently released her to work on light duty and there has been no change apparently to that recommendation. Symptomatically in addition to the symptoms of the upper extremities described above, she complains of posterior neck discomfort that extends into the clavicular and pectoral regions bilaterally.  She has occasional numbness and tingling in the digits of her left hand, occasional difficulty with grasping with the left hand and wrist.    The patient contacted the office requesting a second opinion regarding her cervical spine degenerative changes.  PAST MEDICAL HISTORY:  Notable for remote history of sarcoidosis which has resulted in some chronic obstructive pulmonary disease.  The sarcoidosis is inactive at this time and the chronic obstructive pulmonary disease is not particularly problematic.  She also reports a history of hypertension, glaucoma, hypercholesterolemia and diverticulitis.  No history of myocardial infarction, cancer, stroke, or diabetes.  Previous surgery includes left wrist ORIF in April of 2011 for fracture, left carpal tunnel release and removal of the instrumentation in December of 2011 by Dr. Melvyn Novas, hysterectomy in 1995, removal of fatty adipose tissue in 1991, and laser eye surgery in 2010 by Dr. Eulah Pont.    SHE REPORTS AN ALLERGY TO PENICILLIN CAUSING RASH AND SHORTNESS OF BREATH.  ASPIRIN CAUSES UPSET STOMACH.  Current medications include Travatan eye drops in the evening, Betoptic 0.25% 1 drop each eye q.12.h., Dorzolamide HCL 2%  1 drop each eye q.12.h., Zantac 300 mg. q.p.m., Atorvastatin 40 mg. q.h.s.,  Benazepril/Hydrochlorothiazide 20/25 q.d., Metformin 500 mg. q.p.m., Methocarbamol 500 mg. b.i.d. p.r.n., Tramadol 50 mg. q.6.h. p.r.n., Hydrocodone 5/325 q.6.h. p.r.n., Lorazepam 2 mg.  to 1 tablet q.h.s., Tylenol 650 mg. p.r.n.   FAMILY HISTORY:    Parents have passed on.  She really does not have medical information regarding her parents.    SOCIAL  HISTORY:    The patient is divorced.  She works for [employer].  She does not smoke, drink alcoholic beverages or have a history of substance abuse.   REVIEW OF SYSTEMS:   Notable for those symptoms described in the History of Present Illness and Past Medical History but her 14-point Review of Systems sheet is otherwise unremarkable.  PHYSICAL EXAMINATION:  The patient is a well developed, well nourished, black female in no acute distress. Ht. 5'5". Wt. 165 pounds.  She is afebrile. Lungs are clear to auscultation.  She has symmetrical respiratory excursion.  Heart: regular rate and rhythm.  Normal S1 and S2.  There is no murmur.  Extremity examination shows no clubbing, cyanosis or edema.  Musculoskeletal examination shows mild tenderness to palpation in the left paracervical region and none over the cervical spinous processes and none over the right paracervical region.  She has a good range of motion of the neck through flexion, extension, and lateral flexion to either side.  NEUROLOGICAL EXAMINATION: Shows 5/5 strength to the deltoid, biceps, triceps, and intrinsics bilaterally as well as right grip; however the left grip is 4/5.  Sensation is essentially intact to pin prick to the digits of the upper extremities bilaterally.  Reflexes are minimal in the biceps, brachioradialis, triceps and quadriceps, absent in the gastrocnemii and symmetrical bilaterally.  Toes are downgoing bilaterally.  She has normal gait and stance.  DIAGNOSTIC STUDIES:   Flexion and extension cervical spine x-rays are done today at the office and we reviewed those in conjunction with 5-view cervical spine x-ray series and MRI scan of the spine previously done.    STUDY:     Cervical spine x-rays. VIEWS:     Flexion and extension (2 views). INDICATIONS:    Neck pain and bilateral cervical radicular discomfort.   FINDINGS:     The patient has multilevel degenerative disc disease and spondylosis at C4-5, C5-6,  C6-7 more so than C3-4.  Overall alignment is good and is stable through flexion and extension.  Oblique images show osteophytic neural foraminal encroachment particularly bilaterally at C5-6 and C6-7 and again to a lesser extent at C3-4.    MRI scan reconfirms the multilevel degenerative disc disease and spondylosis least so at C3-4, more so at C4-5 and C6-7 and C6-7.  C2-3 and C7-T1 are unremarkable.  IMPRESSION:    Patient with neck pain and bilateral cervical radicular discomfort left worse than right.  Its relationship to her work injury is uncertain and no records from any of her other treating physicians were available for review.  I do think she is symptomatic from the degenerative disc disease in the cervical spine but the cause of her left grip weakness may well be a local left hand problem rather than arising from the cervical spine.    RECOMMENDATIONS:   I discussed my assessment and impression with the patient and reviewed her x-rays and MRI scan with her.  I do favor surgical intervention for the multilevel degenerative changes in the neck.  I favor a 3-level C4-5, C5-6 and C6-7 anterior cervical decompression and arthrodesis with allograft and cervical plating. I do  not feel that surgery should be done at this time at the C3-4 level where the degenerative changes are much more mild.  I discussed the nature of surgery, typical length of surgery, hospital stay and overall recuperation, and limitations postoperatively, our recommendation for postop immobilization in a soft cervical collar and the risks of surgery including risks of infection, bleeding, possible need for transfusion, the risk of nerve dysfunction, pain, weakness, numbness or paresthesias, the risk of spinal cord dysfunction, paralysis of all four limbs, and quadriplegia, the risk of failure of the arthrodesis and possible need for further surgery and the anesthetic risks of myocardial infarction, stroke, pneumonia and death.     Understanding all of this, she would like to proceed with surgery.  She asked that I take over the care of her cervical spine and I have agreed to do so.  We will set her up for surgery next week.  We fitted her for a universal size soft cervical collar to provide comfort and support during the postoperative period and she will let us know if she needs anything else in the meantime.  I anticipate her being out of work for about 2 months but then she should be able to return to the previous light duty work that Dr. Melvyn Novas had recommended and prescribed.    [facility]         Hewitt Shorts, M.D.

## 2011-07-31 NOTE — Preoperative (Signed)
Beta Blockers   Reason not to administer Beta Blockers:Not Applicable 

## 2011-07-31 NOTE — Transfer of Care (Signed)
Immediate Anesthesia Transfer of Care Note  Patient: Sara [name]  Procedure(s) Performed: Procedure(s) (LRB): ANTERIOR CERVICAL DECOMPRESSION/DISCECTOMY FUSION 3 LEVELS (N/A)  Patient Location: PACU  Anesthesia Type: General  Level of Consciousness: awake  Airway & Oxygen Therapy: Patient Spontanous Breathing and Patient connected to nasal cannula oxygen  Post-op Assessment: Report given to PACU RN  Post vital signs: Reviewed  Complications: No apparent anesthesia complications

## 2011-08-01 ENCOUNTER — Encounter (HOSPITAL_COMMUNITY): Payer: Self-pay | Admitting: Neurosurgery

## 2011-08-01 LAB — GLUCOSE, CAPILLARY
Glucose-Capillary: 112 mg/dL — ABNORMAL HIGH (ref 70–99)
Glucose-Capillary: 117 mg/dL — ABNORMAL HIGH (ref 70–99)

## 2011-08-01 MED ORDER — OXYCODONE-ACETAMINOPHEN 5-325 MG PO TABS: 1.0000 | ORAL_TABLET | ORAL | Status: AC | PRN

## 2011-08-01 MED ORDER — PNEUMOCOCCAL VAC POLYVALENT 25 MCG/0.5ML IJ INJ
0.5000 mL | INJECTION | INTRAMUSCULAR | Status: DC
  Filled 2011-08-01 (×2): qty 0.5

## 2011-08-01 NOTE — Discharge Instructions (Signed)

## 2011-08-01 NOTE — Discharge Summary (Signed)
Physician Discharge Summary  Patient ID: Sara [name] MRN: [id number] DOB/AGE: 1946/01/10 66 y.o.  Admit date: 07/31/2011 Discharge date: 08/01/2011  Admission Diagnoses: Cervical spondylosis, cervical degenerative disc disease, spondylitic cervical disc herniation, cervical radiculopathy  Discharge Diagnoses: Cervical spondylosis, cervical degenerative disc disease, spondylitic cervical disc herniation, cervical radiculopathy  Discharged Condition: good  Hospital Course: Patient was admitted underwent a three-level C4-5, C5-6, and C6-7 anterior cervical decompression and arthrodesis with allograft and tether cervical plating. She has done nicely following surgery. She's had good relief of her left cervical radicular pain. Her wound is healing well. She is up and ambulating actively. He is voiding well. She is being discharged to home with instructions regarding wound care and activities. She is to return for followup with me in 3 weeks.  Discharge Exam: Blood pressure 115/71, pulse 72, temperature 98.6 F (37 C), temperature source Oral, resp. rate 16, SpO2 94.00%.  Disposition: Home  Discharge Orders    Future Appointments: Provider: Department: Dept Phone: Center:   09/09/2011 3:45 PM Erick Colace, MD [facility] [phone] None     Medication List  As of 08/01/2011 12:16 PM   TAKE these medications         atorvastatin 40 MG tablet   Commonly known as: LIPITOR   Take 20 mg by mouth daily.      benazepril-hydrochlorthiazide 20-25 MG per tablet   Commonly known as: LOTENSIN HCT   Take 0.5 tablets by mouth daily.      betaxolol 0.25 % ophthalmic suspension   Commonly known as: BETOPTIC-S   Place 1 drop into both eyes 2 (two) times daily.      dorzolamide 2 % ophthalmic solution   Commonly known as: TRUSOPT   Place 1 drop into both eyes 2 (two) times daily.      HYDROcodone-acetaminophen 5-325 MG per tablet   Commonly known as: NORCO   Take 1 tablet by mouth  every 6 (six) hours as needed. For pain      LORazepam 2 MG tablet   Commonly known as: ATIVAN   Take 1 mg by mouth at bedtime.      metFORMIN 500 MG (MOD) 24 hr tablet   Commonly known as: GLUMETZA   Take 500 mg by mouth at bedtime.      methocarbamol 500 MG tablet   Commonly known as: ROBAXIN   Take 500 mg by mouth 2 (two) times daily as needed. For pain      oxyCODONE-acetaminophen 5-325 MG per tablet   Commonly known as: PERCOCET   Take 1-2 tablets by mouth every 4 (four) hours as needed for pain.      ranitidine 300 MG capsule   Commonly known as: ZANTAC   Take 300 mg by mouth every evening.      traMADol 50 MG tablet   Commonly known as: ULTRAM   Take 50 mg by mouth every 6 (six) hours as needed. For pain      travoprost (benzalkonium) 0.004 % ophthalmic solution   Commonly known as: TRAVATAN   Place 1 drop into both eyes at bedtime.      TYLENOL ARTHRITIS PAIN 650 MG CR tablet   Generic drug: acetaminophen   Take 1,300 mg by mouth every 8 (eight) hours as needed. For pain             Signed: Hewitt Shorts, MD 08/01/2011, 12:16 PM

## 2011-08-07 ENCOUNTER — Encounter (HOSPITAL_COMMUNITY): Payer: Self-pay

## 2011-08-29 ENCOUNTER — Telehealth: Payer: Self-pay | Admitting: Physical Medicine & Rehabilitation

## 2011-08-29 NOTE — Telephone Encounter (Signed)
Cancel the appt and make a follow up once healed from ACDF

## 2011-08-29 NOTE — Telephone Encounter (Signed)
Patient states she just had ACDF on 07/31/11 and wants to know if you still want to see her on 09/10/11.

## 2011-09-09 ENCOUNTER — Ambulatory Visit: Payer: Self-pay | Admitting: Physical Medicine & Rehabilitation

## 2011-09-10 ENCOUNTER — Ambulatory Visit: Payer: Self-pay | Admitting: Physical Medicine & Rehabilitation

## 2011-11-15 ENCOUNTER — Encounter: Payer: PRIVATE HEALTH INSURANCE | Attending: Physical Medicine & Rehabilitation

## 2011-11-15 ENCOUNTER — Encounter: Payer: Self-pay | Admitting: Physical Medicine & Rehabilitation

## 2011-11-15 ENCOUNTER — Ambulatory Visit (HOSPITAL_BASED_OUTPATIENT_CLINIC_OR_DEPARTMENT_OTHER): Payer: PRIVATE HEALTH INSURANCE | Admitting: Physical Medicine & Rehabilitation

## 2011-11-15 VITALS — BP 117/54 | HR 88 | Resp 16 | Ht 66.0 in | Wt 156.0 lb

## 2011-11-15 DIAGNOSIS — M533 Sacrococcygeal disorders, not elsewhere classified: Secondary | ICD-10-CM

## 2011-11-15 NOTE — Patient Instructions (Signed)
Continue Tylenol for pain Continue TENS unit as needed See me in 6 months Call me if you have a flareup in pain below back area

## 2011-11-15 NOTE — Progress Notes (Signed)
Subjective:    Patient ID: Sara [name], female    DOB: 1945/05/08, 66 y.o.   MRN: [id number]  HPI  ACDF C4-7 May 15th Dr. Newell Coral, no complications, still under care of neurosurgery. Back at work FT, restricted lifting to 13lbs Low back doing ok.  Still using TENS on a daily basis Patient feels the best she has in years Pain Inventory Average Pain 4 Pain Right Now 6 My pain is dull and aching  In the last 24 hours, has pain interfered with the following? General activity 6 Relation with others 0 Enjoyment of life 4 What TIME of day is your pain at its worst? evening Sleep (in general) Good  Pain is worse with: walking, bending, sitting, inactivity, standing and some activites Pain improves with: rest, heat/ice, therapy/exercise, pacing activities, medication and TENS Relief from Meds: 10  Mobility walk without assistance how many minutes can you walk? 30 ability to climb steps?  yes do you drive?  yes  Function employed # of hrs/week 40 I need assistance with the following:  household duties and shopping  Neuro/Psych weakness spasms  Prior Studies Any changes since last visit?  no  Physicians involved in your care Any changes since last visit?  no   Family History  Problem Relation Age of Onset  . Cancer Mother 52    breast cancer  . Pneumonia Father   . Anesthesia problems Neg Hx   . Hypotension Neg Hx   . Malignant hyperthermia Neg Hx   . Pseudochol deficiency Neg Hx    History   Social History  . Marital Status: Widowed    Spouse Name: N/A    Number of Children: N/A  . Years of Education: N/A   Social History Main Topics  . Smoking status: Never Smoker   . Smokeless tobacco: Never Used  . Alcohol Use: No  . Drug Use: No  . Sexually Active: Yes    Birth Control/ Protection: Surgical   Other Topics Concern  . None   Social History Narrative  . None   Past Surgical History  Procedure Date  . Carpal tunnel release 2012  . Wrist  surgery     ORIF left wrist and then removed d/t protruding screw in 2012  . Breast surgery 20+yrs ago    left breast;benign  . Tonsillectomy 30+yrs ago  . Abdominal hysterectomy 1975  . Refractive surgery 2011    bilateral  . Colonoscopy   . Anterior cervical decomp/discectomy fusion 07/31/2011    Procedure: ANTERIOR CERVICAL DECOMPRESSION/DISCECTOMY FUSION 3 LEVELS;  Surgeon: Hewitt Shorts, MD;  Location: [facility];  Service: Neurosurgery;  Laterality: N/A;  Cervical four-five,Cervical five-six,Cervical six-seven anterior cervical decompression with fusion plating and bonegraft   Past Medical History  Diagnosis Date  . Sarcoidosis >53yrs ago    no problems since  . Asthma   . High cholesterol     takes Lipitor daily  . DeQuervain's disease (tenosynovitis)   . Hay fever   . Diverticulitis   . Glaucoma   . Hypertension     takes Lotensin HCT daily  . COPD (chronic obstructive pulmonary disease)   . Bronchitis     hx of;>83yrs ago  . Headache     related neck issues  . Dizziness     related to neck issues  . Chronic neck pain     arthritis  . Degenerative disc disease     neck  . Frozen shoulder September 2012  .  History of shingles 2012  . GERD (gastroesophageal reflux disease)     takes Ranitidine nightly  . Diabetes mellitus     takes Metformin nightly  . Insomnia     d/t pain and takes Ativan nightly   BP 117/54  Pulse 88  Resp 16  Ht 5\' 6"  (1.676 m)  Wt 156 lb (70.761 kg)  BMI 25.18 kg/m2  SpO2 96%      Review of Systems  HENT: Negative.   Eyes: Negative.   Respiratory: Negative.   Cardiovascular: Negative.   Gastrointestinal: Negative.   Genitourinary: Negative.   Musculoskeletal: Positive for back pain.  Skin: Negative.   Neurological: Positive for weakness.  Hematological: Negative.   Psychiatric/Behavioral: Negative.        Objective:   Physical Exam  Constitutional: She is oriented to person, place, and time. She appears  well-developed and well-nourished.  Musculoskeletal:       Lumbar back: Normal.  Neurological: She is alert and oriented to person, place, and time. She has normal strength and normal reflexes. No sensory deficit.       - LE sensory def  Psychiatric: She has a normal mood and affect.          Assessment & Plan:  1. Sacroiliac disorder doing much better. Continue with TENS unit on a as needed basis.A longer on tramadol is taking Tylenol See her back in 6 months Any problems related to her neck or her upper extremity should be referred to her neurosurgeon

## 2011-12-12 ENCOUNTER — Telehealth: Payer: Self-pay

## 2011-12-12 NOTE — Telephone Encounter (Signed)
Please call did not leave a message.  Recording does not tell them to.

## 2011-12-12 NOTE — Telephone Encounter (Signed)
Status of spinal injection authorization from workers comp.

## 2011-12-13 NOTE — Telephone Encounter (Signed)
Dr. Jodean Lima last note says that her SI disorder is doing much better, I have never seen this patient and can not determine from the last note whether she needs an SI injection, please ask Dr. Doroteo Bradford

## 2011-12-13 NOTE — Telephone Encounter (Signed)
Please have patient followup with me next week for checkup

## 2011-12-13 NOTE — Telephone Encounter (Signed)
Patient would like to have an SI injection.  Is this appropriate.  Please advise.

## 2011-12-13 NOTE — Telephone Encounter (Signed)
Need to know what type of spinal injection to get authorized.  Please advise

## 2011-12-16 NOTE — Telephone Encounter (Signed)
Appointment scheduled.

## 2011-12-19 ENCOUNTER — Telehealth: Payer: Self-pay | Admitting: Physical Medicine & Rehabilitation

## 2011-12-19 NOTE — Telephone Encounter (Signed)
Ok for tramadol 50mg  #60 1 po BID

## 2011-12-19 NOTE — Telephone Encounter (Signed)
Her last OV you stated that she is no longer taking Tramadol because she was taking Tylenol. Can we prescribe Tramadol now?

## 2011-12-19 NOTE — Telephone Encounter (Signed)
Refill on Tramadol.  Was here last month and has appt on10/7.

## 2011-12-23 ENCOUNTER — Encounter: Payer: PRIVATE HEALTH INSURANCE | Attending: Physical Medicine & Rehabilitation

## 2011-12-23 ENCOUNTER — Ambulatory Visit (HOSPITAL_BASED_OUTPATIENT_CLINIC_OR_DEPARTMENT_OTHER): Payer: PRIVATE HEALTH INSURANCE | Admitting: Physical Medicine & Rehabilitation

## 2011-12-23 ENCOUNTER — Encounter: Payer: Self-pay | Admitting: Physical Medicine & Rehabilitation

## 2011-12-23 VITALS — BP 130/73 | HR 92 | Resp 16 | Ht 65.0 in | Wt 150.0 lb

## 2011-12-23 DIAGNOSIS — M533 Sacrococcygeal disorders, not elsewhere classified: Secondary | ICD-10-CM | POA: Insufficient documentation

## 2011-12-23 MED ORDER — TRAMADOL HCL 50 MG PO TABS: 50.0000 mg | ORAL_TABLET | Freq: Three times a day (TID) | ORAL | Status: DC | PRN

## 2011-12-23 NOTE — Progress Notes (Signed)
Subjective:    Patient ID: Sara [name], female    DOB: 1945-08-23, 66 y.o.   MRN: [id number]  HPI Chief complaint increasing low back pain Last visit doing well on 11/15/2011. She has returned to work full-time with lifting restrictions. This is after her C4-C7 ACDF 07/31/2011 No pain radiating into the feet. Does have pain into the thighs bilateral. No falls or other trauma Pain Inventory Average Pain 7 Pain Right Now 7 My pain is burning  In the last 24 hours, has pain interfered with the following? General activity 7 Relation with others 7 Enjoyment of life 7 What TIME of day is your pain at its worst? All Day Sleep (in general) Poor  Pain is worse with: walking, bending, sitting and standing Pain improves with: medication Relief from Meds: 2  Mobility walk without assistance ability to climb steps?  yes do you drive?  yes  Function employed # of hrs/week 40  Neuro/Psych weakness numbness spasms  Prior Studies Any changes since last visit?  no  Physicians involved in your care Any changes since last visit?  no   Family History  Problem Relation Age of Onset  . Cancer Mother 45    breast cancer  . Pneumonia Father   . Anesthesia problems Neg Hx   . Hypotension Neg Hx   . Malignant hyperthermia Neg Hx   . Pseudochol deficiency Neg Hx    History   Social History  . Marital Status: Widowed    Spouse Name: N/A    Number of Children: N/A  . Years of Education: N/A   Social History Main Topics  . Smoking status: Never Smoker   . Smokeless tobacco: Never Used  . Alcohol Use: No  . Drug Use: No  . Sexually Active: Yes    Birth Control/ Protection: Surgical   Other Topics Concern  . None   Social History Narrative  . None   Past Surgical History  Procedure Date  . Carpal tunnel release 2012  . Wrist surgery     ORIF left wrist and then removed d/t protruding screw in 2012  . Breast surgery 20+yrs ago    left breast;benign  .  Tonsillectomy 30+yrs ago  . Abdominal hysterectomy 1975  . Refractive surgery 2011    bilateral  . Colonoscopy   . Anterior cervical decomp/discectomy fusion 07/31/2011    Procedure: ANTERIOR CERVICAL DECOMPRESSION/DISCECTOMY FUSION 3 LEVELS;  Surgeon: Hewitt Shorts, MD;  Location: [facility];  Service: Neurosurgery;  Laterality: N/A;  Cervical four-five,Cervical five-six,Cervical six-seven anterior cervical decompression with fusion plating and bonegraft   Past Medical History  Diagnosis Date  . Sarcoidosis >69yrs ago    no problems since  . Asthma   . High cholesterol     takes Lipitor daily  . DeQuervain's disease (tenosynovitis)   . Hay fever   . Diverticulitis   . Glaucoma(365)   . Hypertension     takes Lotensin HCT daily  . COPD (chronic obstructive pulmonary disease)   . Bronchitis     hx of;>39yrs ago  . Headache     related neck issues  . Dizziness     related to neck issues  . Chronic neck pain     arthritis  . Degenerative disc disease     neck  . Frozen shoulder September 2012  . History of shingles 2012  . GERD (gastroesophageal reflux disease)     takes Ranitidine nightly  . Diabetes mellitus  takes Metformin nightly  . Insomnia     d/t pain and takes Ativan nightly   BP 130/73  Pulse 92  Resp 16  Ht 5\' 5"  (1.651 m)  Wt 150 lb (68.04 kg)  BMI 24.96 kg/m2  SpO2 99%      Review of Systems  HENT: Negative.   Eyes: Negative.   Respiratory: Negative.   Cardiovascular: Negative.   Gastrointestinal: Negative.   Genitourinary: Negative.   Musculoskeletal: Positive for myalgias, back pain and arthralgias.  Skin: Negative.   Neurological: Positive for weakness and numbness.  Psychiatric/Behavioral: Negative.        Objective:   Physical Exam  Constitutional: She is oriented to person, place, and time. She appears well-developed and well-nourished.  Musculoskeletal:       Lumbar back: She exhibits decreased range of motion, tenderness,  bony tenderness and pain. She exhibits no edema, no deformity and no spasm.       Negative straight leg raising test bilateral Motor strength is normal in both hip flexors knee extensors ankle dorsiflexors and plantar flexors  Neurological: She is alert and oriented to person, place, and time. She displays normal reflexes. No sensory deficit. She exhibits normal muscle tone. Coordination normal.          Assessment & Plan:  1. The sacroiliac disorder bilateral recent exacerbation. Will resume tramadol 50 mg 3 times per day No work restrictions Repeat sacroiliac injection bilateral within 2 weeks Discussed with patient agrees with plan she would like to get the injection performed as soon as possible

## 2011-12-23 NOTE — Patient Instructions (Addendum)
bilateral sacroiliac injections next visit in about 2 weeks as long as we get approval from workers compensation Restart tramadol 3 times per day

## 2012-01-02 ENCOUNTER — Ambulatory Visit (HOSPITAL_BASED_OUTPATIENT_CLINIC_OR_DEPARTMENT_OTHER): Payer: PRIVATE HEALTH INSURANCE | Admitting: Physical Medicine & Rehabilitation

## 2012-01-02 ENCOUNTER — Encounter: Payer: Self-pay | Admitting: Physical Medicine & Rehabilitation

## 2012-01-02 VITALS — BP 112/58 | HR 87 | Resp 16 | Ht 65.0 in | Wt 157.0 lb

## 2012-01-02 DIAGNOSIS — M533 Sacrococcygeal disorders, not elsewhere classified: Secondary | ICD-10-CM

## 2012-01-02 NOTE — Progress Notes (Signed)
Right sacroiliac injection under fluoroscopic guidance  Indication: Right Low back and buttocks pain not relieved by medication management and other conservative care.  Informed consent was obtained after describing risks and benefits of the procedure with the patient, this includes bleeding, bruising, infection, paralysis and medication side effects. The patient wishes to proceed and has given written consent. The patient was placed in a prone position. The lumbar and sacral area was marked and prepped with Betadine. A 25-gauge 1-1/2 inch needle was inserted into the skin and subcutaneous tissue and 1 mL of 1% lidocaine was injected. Then a 25-gauge 3 inch spinal needle was inserted under fluoroscopic guidance into the Right sacroiliac joint. AP and lateral images were utilized. Omnipaque 180x0.5 mL under live fluoroscopy demonstrated no intravascular uptake. Then a solution containing one ML of 40 mg per mL depomedrol and 2 ML of 1% lidocaine MPF was injected x1.5 mL. Patient tolerated the procedure well. Post procedure instructions were given. Please see post procedure form. 

## 2012-01-02 NOTE — Progress Notes (Signed)
  PROCEDURE RECORD [facility]   Name: Sara [name] DOB:May 10, 1945 MRN: [id number]  Date:01/02/2012  Physician: Claudette Laws, MD    Nurse/CMA: Redgie Grayer  Allergies:  Allergies  Allergen Reactions  . Penicillins Shortness Of Breath    And hives  . Aspirin Other (See Comments)    Stomach upset, but low dose is ok  . Sulfa Antibiotics Other (See Comments)    unknown    Consent Signed: yes  Is patient diabetic? yes  CBG today? Didn't check this AM  Pregnant: no LMP: No LMP recorded. Patient has had a hysterectomy. (age [age])  Anticoagulants: no Anti-inflammatory: no Antibiotics: no  Procedure: SI Injection  Position: Prone Start Time: 9:19am  End Time: 9:21am  Fluoro Time: 7 seconds  RN/CMA Carroll,CMA Carroll,CMA    Time 8:44am 9:24am    BP 112/58 122/55    Pulse 87 75    Respirations 16 16    O2 Sat 97% 99%    S/S 6 6    Pain Level 7/10 7/10     D/C home with Dois Davenport (sister), patient A & O X 3, D/C instructions reviewed, and sits independently.

## 2012-01-02 NOTE — Patient Instructions (Addendum)
May return to work tomorrow I'll see you in 4 wks for a followup

## 2012-01-20 ENCOUNTER — Telehealth: Payer: Self-pay | Admitting: Physical Medicine & Rehabilitation

## 2012-01-20 NOTE — Telephone Encounter (Signed)
Please schedule MBB

## 2012-01-23 ENCOUNTER — Telehealth: Payer: Self-pay

## 2012-01-23 NOTE — Telephone Encounter (Signed)
Patient would like to pick up work note for workers comp.  Note was originally inter office mailed.  Letter will be printed again for pick up.

## 2012-01-28 ENCOUNTER — Telehealth: Payer: Self-pay | Admitting: Physical Medicine & Rehabilitation

## 2012-01-28 NOTE — Telephone Encounter (Signed)
Patient in excruciating pain.  Please call.                ( Injection still pending with workers comp/mjk)

## 2012-01-28 NOTE — Telephone Encounter (Signed)
Patient will make appt to discuss problems with Dr Wynn Banker.

## 2012-01-28 NOTE — Telephone Encounter (Signed)
Patient is in excruciating pain in lower back, buttocks, thighs and legs.  It is hard for her to stand and walk.

## 2012-01-29 ENCOUNTER — Telehealth: Payer: Self-pay | Admitting: *Deleted

## 2012-01-29 NOTE — Telephone Encounter (Signed)
Sara [name] called about appointment tomorrow. She needs to know the time, and get the paperwork  faxed to her at [facility] fx # [phone]

## 2012-01-30 ENCOUNTER — Encounter: Payer: Self-pay | Admitting: Physical Medicine & Rehabilitation

## 2012-01-30 ENCOUNTER — Encounter: Payer: PRIVATE HEALTH INSURANCE | Attending: Physical Medicine & Rehabilitation

## 2012-01-30 ENCOUNTER — Ambulatory Visit (HOSPITAL_BASED_OUTPATIENT_CLINIC_OR_DEPARTMENT_OTHER): Payer: PRIVATE HEALTH INSURANCE | Admitting: Physical Medicine & Rehabilitation

## 2012-01-30 VITALS — BP 130/73 | HR 68 | Resp 14 | Ht 65.0 in | Wt 156.0 lb

## 2012-01-30 DIAGNOSIS — IMO0001 Reserved for inherently not codable concepts without codable children: Secondary | ICD-10-CM

## 2012-01-30 DIAGNOSIS — M533 Sacrococcygeal disorders, not elsewhere classified: Secondary | ICD-10-CM | POA: Insufficient documentation

## 2012-01-30 DIAGNOSIS — M7918 Myalgia, other site: Secondary | ICD-10-CM

## 2012-01-30 MED ORDER — DICLOFENAC EPOLAMINE 1.3 % TD PTCH: 1.0000 | MEDICATED_PATCH | Freq: Two times a day (BID) | TRANSDERMAL | Status: DC

## 2012-01-30 NOTE — Progress Notes (Signed)
  Subjective:    Patient ID: Sara [name], female    DOB: 1945/06/19, 66 y.o.   MRN: [id number]  HPI    Review of Systems     Objective:   Physical Exam        Assessment & Plan:

## 2012-01-30 NOTE — Progress Notes (Signed)
Subjective:    Patient ID: Sara [name], female    DOB: 1945-10-24, 66 y.o.   MRN: [id number]  HPI R Sacroiliac disorder Improved after R SI injection Has not been following work restrictions yet Feels her job is too stressful and feels she is doing more work than co workers Pain Inventory Average Pain 7 Pain Right Now 8 My pain is sharp, burning, stabbing, tingling and aching  In the last 24 hours, has pain interfered with the following? General activity 8 Relation with others 8 Enjoyment of life 10 What TIME of day is your pain at its worst? evening Sleep (in general) Fair  Pain is worse with: bending, sitting, standing and some activites Pain improves with: rest Relief from Meds: 5  Mobility walk without assistance how many minutes can you walk? 20-25 ability to climb steps?  yes do you drive?  yes Do you have any goals in this area?  yes  Function employed # of hrs/week 40 I need assistance with the following:  household duties Do you have any goals in this area?  yes  Neuro/Psych weakness tingling trouble walking spasms dizziness loss of taste or smell  Prior Studies x-rays CT/MRI nerve study  Physicians involved in your care Any changes since last visit?  no   Family History  Problem Relation Age of Onset  . Cancer Mother 62    breast cancer  . Pneumonia Father   . Anesthesia problems Neg Hx   . Hypotension Neg Hx   . Malignant hyperthermia Neg Hx   . Pseudochol deficiency Neg Hx    History   Social History  . Marital Status: Widowed    Spouse Name: N/A    Number of Children: N/A  . Years of Education: N/A   Social History Main Topics  . Smoking status: Never Smoker   . Smokeless tobacco: Never Used  . Alcohol Use: No  . Drug Use: No  . Sexually Active: Yes    Birth Control/ Protection: Surgical   Other Topics Concern  . None   Social History Narrative  . None   Past Surgical History  Procedure Date  . Carpal tunnel  release 2012  . Wrist surgery     ORIF left wrist and then removed d/t protruding screw in 2012  . Breast surgery 20+yrs ago    left breast;benign  . Tonsillectomy 30+yrs ago  . Abdominal hysterectomy 1975  . Refractive surgery 2011    bilateral  . Colonoscopy   . Anterior cervical decomp/discectomy fusion 07/31/2011    Procedure: ANTERIOR CERVICAL DECOMPRESSION/DISCECTOMY FUSION 3 LEVELS;  Surgeon: Hewitt Shorts, MD;  Location: [facility];  Service: Neurosurgery;  Laterality: N/A;  Cervical four-five,Cervical five-six,Cervical six-seven anterior cervical decompression with fusion plating and bonegraft   Past Medical History  Diagnosis Date  . Sarcoidosis >65yrs ago    no problems since  . Asthma   . High cholesterol     takes Lipitor daily  . DeQuervain's disease (tenosynovitis)   . Hay fever   . Diverticulitis   . Glaucoma(365)   . Hypertension     takes Lotensin HCT daily  . COPD (chronic obstructive pulmonary disease)   . Bronchitis     hx of;>58yrs ago  . Headache     related neck issues  . Dizziness     related to neck issues  . Chronic neck pain     arthritis  . Degenerative disc disease     neck  .  Frozen shoulder September 2012  . History of shingles 2012  . GERD (gastroesophageal reflux disease)     takes Ranitidine nightly  . Diabetes mellitus     takes Metformin nightly  . Insomnia     d/t pain and takes Ativan nightly   BP 130/73  Pulse 68  Resp 14  Ht 5\' 5"  (1.651 m)  Wt 156 lb (70.761 kg)  BMI 25.96 kg/m2  SpO2 96%    Review of Systems  Constitutional: Positive for appetite change and unexpected weight change.  Cardiovascular: Positive for leg swelling.  Gastrointestinal: Positive for nausea and constipation.  Musculoskeletal: Positive for back pain and gait problem.  Neurological: Positive for dizziness, weakness and numbness.       Spasms, tingling  All other systems reviewed and are negative.       Objective:   Physical Exam    Constitutional: She is oriented to person, place, and time. She appears well-developed and well-nourished.  HENT:  Head: Normocephalic and atraumatic.  Eyes: Conjunctivae normal and EOM are normal. Pupils are equal, round, and reactive to light.  Musculoskeletal:       Right hip: Normal.       Left hip: Normal.       Right knee: Normal.       Left knee: Normal.       Lumbar back: She exhibits decreased range of motion and tenderness. She exhibits no deformity and no spasm.  Neurological: She is alert and oriented to person, place, and time. She has normal strength. No sensory deficit. She exhibits normal muscle tone. Coordination abnormal. Gait normal.  Reflex Scores:      Patellar reflexes are 2+ on the right side and 2+ on the left side.      Achilles reflexes are 2+ on the right side and 2+ on the left side.      Negative straight leg raising test  Psychiatric: She has a normal mood and affect.          Assessment & Plan:  1. Sacroiliac disorder no new injuries. Current flareup is improving 2. Myofascial pain syndrome. I do not think work activities are causing any worsening of her condition. I do think that over time her overall physical conditioning is reducing. She also notes issues at work regarding her coworkers. I would continue her home exercise program. We'll continue current restrictions of alternating standing and sitting half hour increments RTC 8mo Trial of Flector patch

## 2012-01-30 NOTE — Patient Instructions (Signed)
Continue with one half hours sitting and one half hour standing Try flector patch Continue your back exercises See me in one month Continue TENS unit

## 2012-01-31 ENCOUNTER — Ambulatory Visit: Payer: PRIVATE HEALTH INSURANCE | Admitting: Physical Medicine & Rehabilitation

## 2012-02-10 ENCOUNTER — Telehealth: Payer: Self-pay

## 2012-02-10 NOTE — Telephone Encounter (Signed)
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

## 2012-02-25 ENCOUNTER — Encounter: Payer: Self-pay | Admitting: Physical Medicine & Rehabilitation

## 2012-02-25 ENCOUNTER — Ambulatory Visit (HOSPITAL_BASED_OUTPATIENT_CLINIC_OR_DEPARTMENT_OTHER): Payer: PRIVATE HEALTH INSURANCE | Admitting: Physical Medicine & Rehabilitation

## 2012-02-25 ENCOUNTER — Ambulatory Visit: Payer: Self-pay | Admitting: Physical Medicine & Rehabilitation

## 2012-02-25 ENCOUNTER — Encounter: Payer: PRIVATE HEALTH INSURANCE | Attending: Physical Medicine & Rehabilitation

## 2012-02-25 VITALS — BP 135/56 | HR 83 | Resp 14 | Ht 65.0 in | Wt 152.0 lb

## 2012-02-25 DIAGNOSIS — M47816 Spondylosis without myelopathy or radiculopathy, lumbar region: Secondary | ICD-10-CM

## 2012-02-25 DIAGNOSIS — M47817 Spondylosis without myelopathy or radiculopathy, lumbosacral region: Secondary | ICD-10-CM

## 2012-02-25 DIAGNOSIS — M533 Sacrococcygeal disorders, not elsewhere classified: Secondary | ICD-10-CM | POA: Insufficient documentation

## 2012-02-25 NOTE — Progress Notes (Signed)
Right lumbar L3, L4 medial branch blocks and L5 dorsal ramus injection under fluoroscopic guidance  Indication: Right Lumbar pain which is not relieved by medication management or other conservative care and interfering with self-care and mobility.  Informed consent was obtained after describing risks and benefits of the procedure with the patient, this includes bleeding, bruising, infection, paralysis and medication side effects. The patient wishes to proceed and has given written consent. The patient was placed in a prone position. The lumbar area was marked and prepped with Betadine. One ML of 1% lidocaine was injected into each of 3 areas into the skin and subcutaneous tissue. Then a 22-gauge 3.5" spinal needle was inserted targeting the junction of the Right S1 superior articular process and sacral ala junction. Needle was advanced under fluoroscopic guidance. Bone contact was made. Omnipaque 180 was injected x0.5 mL demonstrating no intravascular uptake. Then a solution containing one ML of 4 mg per mL dexamethasone and 3 mL of 2% MPF lidocaine was injected x0.5 mL. Then the Right L5 superior articular process in transverse process junction was targeted. Bone contact was made. Omnipaque 180 was injected x0.5 mL demonstrating no intravascular uptake. Then a solution containing one ML of 4 mg per mL dexamethasone and 3 mL of 2% MPF lidocaine was injected x0.5 mL. Then the Right L4 superior articular process in transverse process junction was targeted. Bone contact was made. Omnipaque 180 was injected x0.5 mL demonstrating no intravascular uptake. Then a solution containing one ML of 4 mg per mL dexamethasone and 3 mL of 2% MPF lidocaine was injected x0.5 mL Patient tolerated procedure well. Post procedure instructions were given. Please refer to post procedure form. 

## 2012-02-25 NOTE — Patient Instructions (Addendum)
Continue current Work restrictions May resume work tomorrow No driving today

## 2012-02-25 NOTE — Progress Notes (Signed)
  PROCEDURE RECORD [facility]   Name: Sara [name] DOB:1946/02/20 MRN: [id number]  Date:02/25/2012  Physician: Claudette Laws, MD    Nurse/CMA: Judeth Cornfield, CMA  Allergies:  Allergies  Allergen Reactions  . Penicillins Shortness Of Breath    And hives  . Aspirin Other (See Comments)    Stomach upset, but low dose is ok  . Sulfa Antibiotics Other (See Comments)    unknown    Consent Signed: yes  Is patient diabetic? yes  CBG today? 102  Pregnant: no LMP: No LMP recorded. Patient has had a hysterectomy. (age 66-55)  Anticoagulants: no Anti-inflammatory: no Antibiotics: no  Procedure:  Right Medial Branch Block Position: Prone Start Time: 1:04 End Time: 1:09 Fluoro Time: 20  RN/CMA Judeth Cornfield CMA [place], CMA    Time 12:48 1:14    BP 135/56 131/63    Pulse 83 83    Respirations 14 14    O2 Sat 96 97    S/S 6 6    Pain Level 7   6     D/C home with Dorthey Sawyer, Sister, patient A & O X 3, D/C instructions reviewed, and sits independently.

## 2012-02-27 ENCOUNTER — Ambulatory Visit: Payer: Self-pay | Admitting: Physical Medicine and Rehabilitation

## 2012-02-28 ENCOUNTER — Ambulatory Visit: Payer: PRIVATE HEALTH INSURANCE | Admitting: Physical Medicine & Rehabilitation

## 2012-03-12 ENCOUNTER — Ambulatory Visit: Payer: Self-pay | Admitting: Physical Medicine & Rehabilitation

## 2012-03-17 ENCOUNTER — Other Ambulatory Visit: Payer: Self-pay | Admitting: Physical Medicine & Rehabilitation

## 2012-03-19 ENCOUNTER — Telehealth: Payer: Self-pay

## 2012-03-19 NOTE — Telephone Encounter (Signed)
Patient called to verify appointment time and date.  03/28/11 at 130.

## 2012-03-27 ENCOUNTER — Encounter: Payer: Self-pay | Admitting: Physical Medicine & Rehabilitation

## 2012-03-27 ENCOUNTER — Encounter: Payer: PRIVATE HEALTH INSURANCE | Attending: Physical Medicine & Rehabilitation

## 2012-03-27 ENCOUNTER — Ambulatory Visit (HOSPITAL_BASED_OUTPATIENT_CLINIC_OR_DEPARTMENT_OTHER): Payer: PRIVATE HEALTH INSURANCE | Admitting: Physical Medicine & Rehabilitation

## 2012-03-27 VITALS — BP 140/73 | HR 82 | Resp 14 | Ht 65.0 in | Wt 151.8 lb

## 2012-03-27 DIAGNOSIS — M47816 Spondylosis without myelopathy or radiculopathy, lumbar region: Secondary | ICD-10-CM | POA: Insufficient documentation

## 2012-03-27 DIAGNOSIS — M47817 Spondylosis without myelopathy or radiculopathy, lumbosacral region: Secondary | ICD-10-CM

## 2012-03-27 DIAGNOSIS — M533 Sacrococcygeal disorders, not elsewhere classified: Secondary | ICD-10-CM

## 2012-03-27 DIAGNOSIS — M7918 Myalgia, other site: Secondary | ICD-10-CM

## 2012-03-27 DIAGNOSIS — IMO0001 Reserved for inherently not codable concepts without codable children: Secondary | ICD-10-CM

## 2012-03-27 NOTE — Progress Notes (Signed)
Subjective:    Patient ID: Sara [name], female    DOB: 03-14-1946, 67 y.o.   MRN: [id number]  HPI Pain improved after  Right L3-4-5 lumbar MBB  Pain with prolonged sitting Now working certain days at another office. She feels that this change has improved her pain Pain Inventory Average Pain 7 Pain Right Now 8 My pain is sharp, burning, stabbing, tingling and aching  In the last 24 hours, has pain interfered with the following? General activity 10 Relation with others 5 Enjoyment of life 8 What TIME of day is your pain at its worst? evening Sleep (in general) Poor  Pain is worse with: walking, bending, sitting, standing and some activites Pain improves with: rest, heat/ice, therapy/exercise, pacing activities, medication and TENS Relief from Meds: 5  Mobility walk without assistance how many minutes can you walk? 25 ability to climb steps?  yes do you drive?  yes Do you have any goals in this area?  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  yes  Neuro/Psych weakness tremor tingling trouble walking spasms dizziness  Prior Studies Any changes since last visit?  no  Physicians involved in your care Any changes since last visit?  no   Family History  Problem Relation Age of Onset  . Cancer Mother 51    breast cancer  . Pneumonia Father   . Anesthesia problems Neg Hx   . Hypotension Neg Hx   . Malignant hyperthermia Neg Hx   . Pseudochol deficiency Neg Hx    History   Social History  . Marital Status: Widowed    Spouse Name: N/A    Number of Children: N/A  . Years of Education: N/A   Social History Main Topics  . Smoking status: Never Smoker   . Smokeless tobacco: Never Used  . Alcohol Use: No  . Drug Use: No  . Sexually Active: No   Other Topics Concern  . None   Social History Narrative  . None   Past  Surgical History  Procedure Date  . Carpal tunnel release 2012  . Wrist surgery     ORIF left wrist and then removed d/t protruding screw in 2012  . Breast surgery 20+yrs ago    left breast;benign  . Tonsillectomy 30+yrs ago  . Abdominal hysterectomy 1975  . Refractive surgery 2011    bilateral  . Colonoscopy   . Anterior cervical decomp/discectomy fusion 07/31/2011    Procedure: ANTERIOR CERVICAL DECOMPRESSION/DISCECTOMY FUSION 3 LEVELS;  Surgeon: Hewitt Shorts, MD;  Location: [facility];  Service: Neurosurgery;  Laterality: N/A;  Cervical four-five,Cervical five-six,Cervical six-seven anterior cervical decompression with fusion plating and bonegraft   Past Medical History  Diagnosis Date  . Sarcoidosis >51yrs ago    no problems since  . Asthma   . High cholesterol     takes Lipitor daily  . DeQuervain's disease (tenosynovitis)   . Hay fever   . Diverticulitis   . Glaucoma(365)   . Hypertension     takes Lotensin HCT daily  . COPD (chronic obstructive pulmonary disease)   . Bronchitis     hx of;>58yrs ago  . Headache     related neck issues  . Dizziness     related to neck issues  . Chronic neck pain     arthritis  . Degenerative disc disease  neck  . Frozen shoulder September 2012  . History of shingles 2012  . GERD (gastroesophageal reflux disease)     takes Ranitidine nightly  . Diabetes mellitus     takes Metformin nightly  . Insomnia     d/t pain and takes Ativan nightly   BP 140/73  Pulse 82  Resp 14  Ht 5\' 5"  (1.651 m)  Wt 151 lb 12.8 oz (68.856 kg)  BMI 25.26 kg/m2  SpO2 98%    Review of Systems  Constitutional: Positive for appetite change and unexpected weight change.  Gastrointestinal: Positive for nausea, abdominal pain and constipation.  Musculoskeletal: Positive for back pain and gait problem.  Neurological: Positive for dizziness, tremors and weakness.       Tingling, spasms  All other systems reviewed and are negative.         Objective:   Physical Exam  Constitutional: She is oriented to person, place, and time. She appears well-developed and well-nourished.  HENT:  Head: Normocephalic and atraumatic.  Musculoskeletal:       Lumbar back: She exhibits decreased range of motion and spasm. She exhibits no tenderness.       -SLR  Neurological: She is alert and oriented to person, place, and time. She has normal strength.  Psychiatric: She has a normal mood and affect.          Assessment & Plan:  1.  Right Sacroiliac disorder last SI joint injection 01/02/12 2.  Right lumbar spondylosis last MBB 02/25/12 Try to stop flector Continue current work restrictions alternating sitting and standing every half hour

## 2012-04-29 ENCOUNTER — Telehealth: Payer: Self-pay | Admitting: *Deleted

## 2012-04-29 NOTE — Telephone Encounter (Signed)
Patient called in to get a prescription for hydrocodone apac 5-325, patients other medications not working. [facility]ants [facility].

## 2012-04-30 NOTE — Telephone Encounter (Signed)
May double tramadol to 100mg  Q 8 hours May call in flexeril 5mg  po TID #45

## 2012-05-05 ENCOUNTER — Telehealth: Payer: Self-pay | Admitting: Physical Medicine & Rehabilitation

## 2012-05-05 NOTE — Telephone Encounter (Signed)
Patient would like to try robaxin.  Please advise.

## 2012-05-05 NOTE — Telephone Encounter (Signed)
Requests refill of Robaxin to [facility] pharmacy.

## 2012-05-05 NOTE — Telephone Encounter (Signed)
May phone in Robaxin 500 mg 3 times a day #45 2 week supply for flareup no refills

## 2012-05-06 MED ORDER — METHOCARBAMOL 500 MG PO TABS: 500.0000 mg | ORAL_TABLET | Freq: Three times a day (TID) | ORAL | Status: DC

## 2012-05-06 NOTE — Telephone Encounter (Signed)
Robaxin sent to pharmacy.  Patient aware.  She was also advised to increase tramadol to 100mg  every 8 hours.  Patient will try this and will follow up Friday at her appointment.

## 2012-05-08 ENCOUNTER — Encounter: Payer: PRIVATE HEALTH INSURANCE | Attending: Physical Medicine & Rehabilitation

## 2012-05-08 ENCOUNTER — Ambulatory Visit (HOSPITAL_BASED_OUTPATIENT_CLINIC_OR_DEPARTMENT_OTHER): Payer: PRIVATE HEALTH INSURANCE | Admitting: Physical Medicine & Rehabilitation

## 2012-05-08 ENCOUNTER — Encounter: Payer: Self-pay | Admitting: Physical Medicine & Rehabilitation

## 2012-05-08 VITALS — BP 131/77 | HR 76 | Resp 14 | Ht 65.0 in | Wt 150.2 lb

## 2012-05-08 DIAGNOSIS — M47816 Spondylosis without myelopathy or radiculopathy, lumbar region: Secondary | ICD-10-CM

## 2012-05-08 DIAGNOSIS — M47817 Spondylosis without myelopathy or radiculopathy, lumbosacral region: Secondary | ICD-10-CM

## 2012-05-08 DIAGNOSIS — M533 Sacrococcygeal disorders, not elsewhere classified: Secondary | ICD-10-CM | POA: Insufficient documentation

## 2012-05-08 MED ORDER — MELOXICAM 7.5 MG PO TABS: 7.5000 mg | ORAL_TABLET | Freq: Every day | ORAL | Status: DC

## 2012-05-08 NOTE — Progress Notes (Signed)
Subjective:    Patient ID: Sara [name], female    DOB: [birth year], 67 y.o.   MRN: [id number]  HPI  Increased right-sided low back pain. No falls no other trauma. Some radiation into the back area. She had one day where she needed help getting into her car. No numbness or tingling in the right leg. No weakness except on one occasion .Had right medial branch block with good effect 02/25/2012  Pain Inventory Average Pain 7 Pain Right Now 9 My pain is sharp, burning, stabbing and tingling  In the last 24 hours, has pain interfered with the following? General activity 10 Relation with others 10 Enjoyment of life 10 What TIME of day is your pain at its worst? daytime Sleep (in general) Poor  Pain is worse with: walking, bending, sitting and standing Pain improves with: rest, heat/ice, therapy/exercise, medication and TENS Relief from Meds: 7  Mobility walk without assistance ability to climb steps?  no do you drive?  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  yes  Physicians involved in your care Any changes since last visit?  yes   Family History  Problem Relation Age of Onset  . Cancer Mother 27    breast cancer  . Pneumonia Father   . Anesthesia problems Neg Hx   . Hypotension Neg Hx   . Malignant hyperthermia Neg Hx   . Pseudochol deficiency Neg Hx    History   Social History  . Marital Status: Widowed    Spouse Name: N/A    Number of Children: N/A  . Years of Education: N/A   Social History Main Topics  . Smoking status: Never Smoker   . Smokeless tobacco: Never Used  . Alcohol Use: No  . Drug Use: No  . Sexually Active: No   Other Topics Concern  . None   Social History Narrative  . None   Past  Surgical History  Procedure Laterality Date  . Carpal tunnel release  2012  . Wrist surgery      ORIF left wrist and then removed d/t protruding screw in 2012  . Breast surgery  20+yrs ago    left breast;benign  . Tonsillectomy  30+yrs ago  . Abdominal hysterectomy  1975  . Refractive surgery  2011    bilateral  . Colonoscopy    . Anterior cervical decomp/discectomy fusion  07/31/2011    Procedure: ANTERIOR CERVICAL DECOMPRESSION/DISCECTOMY FUSION 3 LEVELS;  Surgeon: Hewitt Shorts, MD;  Location: [facility];  Service: Neurosurgery;  Laterality: N/A;  Cervical four-five,Cervical five-six,Cervical six-seven anterior cervical decompression with fusion plating and bonegraft   Past Medical History  Diagnosis Date  . Sarcoidosis >13yrs ago    no problems since  . Asthma   . High cholesterol     takes Lipitor daily  . DeQuervain's disease (tenosynovitis)   . Hay fever   . Diverticulitis   . Glaucoma(365)   . Hypertension     takes Lotensin HCT daily  . COPD (chronic obstructive pulmonary disease)   . Bronchitis     hx of;>3yrs ago  . Headache     related neck issues  . Dizziness     related to neck issues  . Chronic neck pain     arthritis  .  Degenerative disc disease     neck  . Frozen shoulder September 2012  . History of shingles 2012  . GERD (gastroesophageal reflux disease)     takes Ranitidine nightly  . Diabetes mellitus     takes Metformin nightly  . Insomnia     d/t pain and takes Ativan nightly   BP 131/77  Pulse 76  Resp 14  Ht 5\' 5"  (1.651 m)  Wt 150 lb 3.2 oz (68.13 kg)  BMI 24.99 kg/m2  SpO2 99%   Review of Systems  Musculoskeletal: Positive for back pain.  All other systems reviewed and are negative.       Objective:   Physical Exam  Constitutional: She is oriented to person, place, and time. She appears well-developed and well-nourished.  HENT:  Head: Normocephalic and atraumatic.  Musculoskeletal:  Lumbar back: She exhibits  decreased range of motion and spasm. She exhibits PSIS tenderness.  -SLR  Normal sensation Normal deep tendon reflexes Neurological: She is alert and oriented to person, place, and time. She has normal strength.  Psychiatric: She has a normal mood and affect.       Assessment & Plan:  1. Right Sacroiliac disorder last SI joint injection 01/02/12  2. Right lumbar spondylosis last MBB 02/25/12 -Current pain flareup is from this region Stop flector swhich to Mobic 7.5 mg x2 weeks Continue Robaxin for total of 2 week RTC 2 weeks if not better at that checkup would repeat medial branch block Continue current work restrictions alternating sitting and standing

## 2012-05-08 NOTE — Patient Instructions (Addendum)
I think you are having a flareup of lumbar spondylosis We will give you the anti-inflammatory for 2 week. Take with food. Return to clinic 2 weeks No change in work restrictions

## 2012-05-25 ENCOUNTER — Ambulatory Visit (HOSPITAL_BASED_OUTPATIENT_CLINIC_OR_DEPARTMENT_OTHER): Payer: PRIVATE HEALTH INSURANCE | Admitting: Physical Medicine & Rehabilitation

## 2012-05-25 ENCOUNTER — Encounter: Payer: Self-pay | Admitting: Physical Medicine & Rehabilitation

## 2012-05-25 ENCOUNTER — Encounter: Payer: PRIVATE HEALTH INSURANCE | Attending: Physical Medicine & Rehabilitation

## 2012-05-25 VITALS — BP 133/72 | HR 95 | Resp 14 | Ht 65.0 in | Wt 149.0 lb

## 2012-05-25 DIAGNOSIS — IMO0001 Reserved for inherently not codable concepts without codable children: Secondary | ICD-10-CM

## 2012-05-25 DIAGNOSIS — M533 Sacrococcygeal disorders, not elsewhere classified: Secondary | ICD-10-CM | POA: Insufficient documentation

## 2012-05-25 DIAGNOSIS — M47817 Spondylosis without myelopathy or radiculopathy, lumbosacral region: Secondary | ICD-10-CM

## 2012-05-25 DIAGNOSIS — M47816 Spondylosis without myelopathy or radiculopathy, lumbar region: Secondary | ICD-10-CM

## 2012-05-25 DIAGNOSIS — M7918 Myalgia, other site: Secondary | ICD-10-CM

## 2012-05-25 MED ORDER — METHOCARBAMOL 500 MG PO TABS: 500.0000 mg | ORAL_TABLET | Freq: Three times a day (TID) | ORAL | Status: DC

## 2012-05-25 MED ORDER — TRAMADOL HCL 50 MG PO TABS: 50.0000 mg | ORAL_TABLET | Freq: Three times a day (TID) | ORAL | Status: DC | PRN

## 2012-05-25 NOTE — Progress Notes (Signed)
Subjective:    Patient ID: Sara [name], female    DOB: 01-31-1946, 67 y.o.   MRN: [id number]  HPI Flare up right-sided low back pain, improving. No falls no other trauma. Some radiation into the back area. She had one day where she was sore after she drove around in her car. No numbness or tingling in the right leg. No weakness.  Had right medial branch block with good effect 02/25/2012  Pain Inventory Average Pain 8 Pain Right Now 6 My pain is sharp, burning, stabbing and aching  In the last 24 hours, has pain interfered with the following? General activity 10 Relation with others 10 Enjoyment of life 10 What TIME of day is your pain at its worst? evening Sleep (in general) Fair  Pain is worse with: bending and some activites Pain improves with: rest, heat/ice, therapy/exercise, pacing activities, medication, TENS and injections Relief from Meds: 8  Mobility walk without assistance ability to climb steps?  yes do you drive?  yes Do you have any goals in this area?  yes  Function employed # of hrs/week 40 Do you have any goals in this area?  yes  Neuro/Psych tingling trouble walking spasms dizziness  Prior Studies Any changes since last visit?  no  Physicians involved in your care Any changes since last visit?  no   Family History  Problem Relation Age of Onset  . Cancer Mother 37    breast cancer  . Pneumonia Father   . Anesthesia problems Neg Hx   . Hypotension Neg Hx   . Malignant hyperthermia Neg Hx   . Pseudochol deficiency Neg Hx    History   Social History  . Marital Status: Widowed    Spouse Name: N/A    Number of Children: N/A  . Years of Education: N/A   Social History Main Topics  . Smoking status: Never Smoker   . Smokeless tobacco: Never Used  . Alcohol Use: No  . Drug Use: No  . Sexually Active: No   Other Topics Concern  . None   Social History Narrative  . None   Past Surgical History  Procedure Laterality Date  .  Carpal tunnel release  2012  . Wrist surgery      ORIF left wrist and then removed d/t protruding screw in 2012  . Breast surgery  20+yrs ago    left breast;benign  . Tonsillectomy  30+yrs ago  . Abdominal hysterectomy  1975  . Refractive surgery  2011    bilateral  . Colonoscopy    . Anterior cervical decomp/discectomy fusion  07/31/2011    Procedure: ANTERIOR CERVICAL DECOMPRESSION/DISCECTOMY FUSION 3 LEVELS;  Surgeon: Hewitt Shorts, MD;  Location: [facility];  Service: Neurosurgery;  Laterality: N/A;  Cervical four-five,Cervical five-six,Cervical six-seven anterior cervical decompression with fusion plating and bonegraft   Past Medical History  Diagnosis Date  . Sarcoidosis >12yrs ago    no problems since  . Asthma   . High cholesterol     takes Lipitor daily  . DeQuervain's disease (tenosynovitis)   . Hay fever   . Diverticulitis   . Glaucoma(365)   . Hypertension     takes Lotensin HCT daily  . COPD (chronic obstructive pulmonary disease)   . Bronchitis     hx of;>33yrs ago  . Headache     related neck issues  . Dizziness     related to neck issues  . Chronic neck pain     arthritis  .  Degenerative disc disease     neck  . Frozen shoulder September 2012  . History of shingles 2012  . GERD (gastroesophageal reflux disease)     takes Ranitidine nightly  . Diabetes mellitus     takes Metformin nightly  . Insomnia     d/t pain and takes Ativan nightly   BP 133/72  Pulse 95  Resp 14  Ht 5\' 5"  (1.651 m)  Wt 149 lb (67.586 kg)  BMI 24.79 kg/m2  SpO2 95%     Review of Systems  Constitutional: Positive for unexpected weight change.  Gastrointestinal: Positive for nausea and constipation.  Musculoskeletal: Positive for back pain and gait problem.  Neurological: Positive for dizziness.  All other systems reviewed and are negative.       Objective:   Physical Exam Constitutional: She is oriented to person, place, and time. She appears well-developed  and well-nourished.  HENT:  Head: Normocephalic and atraumatic.  Musculoskeletal:  Lumbar back: She exhibits decreased range of motion and spasm. She exhibits PSIS tenderness.  -SLR  Normal sensation  Normal deep tendon reflexes Neurological: She is alert and oriented to person, place, and time. She has normal strength.  Psychiatric: She has a normal mood and affect     Assessment & Plan:  1. Right Sacroiliac disorder last SI joint injection 01/02/12  2. Right lumbar spondylosis last MBB 02/25/12 -Current pain flareup is from this region  D/C Mobic 7.5 mg x2 weeks  Continue Robaxin  Reduce tramadol to one po tid RTC 2 mo,Please call if pain increases prior to that time Continue current work restrictions alternating sitting and standing

## 2012-05-25 NOTE — Patient Instructions (Signed)
Call me if stopping Mobic results in more pain

## 2012-06-02 ENCOUNTER — Encounter: Payer: Self-pay | Admitting: Physical Medicine & Rehabilitation

## 2012-06-02 ENCOUNTER — Ambulatory Visit (HOSPITAL_BASED_OUTPATIENT_CLINIC_OR_DEPARTMENT_OTHER): Payer: 59 | Admitting: Physical Medicine & Rehabilitation

## 2012-06-02 ENCOUNTER — Encounter: Payer: PRIVATE HEALTH INSURANCE | Attending: Physical Medicine & Rehabilitation

## 2012-06-02 VITALS — BP 139/80 | HR 96 | Resp 14 | Ht 65.0 in | Wt 149.0 lb

## 2012-06-02 DIAGNOSIS — I10 Essential (primary) hypertension: Secondary | ICD-10-CM | POA: Insufficient documentation

## 2012-06-02 DIAGNOSIS — M19079 Primary osteoarthritis, unspecified ankle and foot: Secondary | ICD-10-CM | POA: Insufficient documentation

## 2012-06-02 DIAGNOSIS — J4489 Other specified chronic obstructive pulmonary disease: Secondary | ICD-10-CM | POA: Insufficient documentation

## 2012-06-02 DIAGNOSIS — E119 Type 2 diabetes mellitus without complications: Secondary | ICD-10-CM | POA: Insufficient documentation

## 2012-06-02 DIAGNOSIS — M19071 Primary osteoarthritis, right ankle and foot: Secondary | ICD-10-CM

## 2012-06-02 DIAGNOSIS — H409 Unspecified glaucoma: Secondary | ICD-10-CM | POA: Insufficient documentation

## 2012-06-02 DIAGNOSIS — K219 Gastro-esophageal reflux disease without esophagitis: Secondary | ICD-10-CM | POA: Insufficient documentation

## 2012-06-02 LAB — SEDIMENTATION RATE: Sed Rate: 4 mm/hr (ref 0–22)

## 2012-06-02 MED ORDER — METHYLPREDNISOLONE 4 MG PO KIT: PACK | ORAL | Status: DC

## 2012-06-02 NOTE — Patient Instructions (Addendum)
You received a cortisone injection today. Please start the Medrol Dosepak which is oral cortisone medicine tomorrow Blood work is to check for gout IceThe right ankle 20 minutes every 2 hours when awake Avoid walking on your right ankle, continue to use your cane No work until Friday, 06/05/2012

## 2012-06-02 NOTE — Progress Notes (Signed)
Subjective:    Patient ID: Sara [name], female    DOB: 20-Jul-1945, 67 y.o.   MRN: [id number]  HPI Chief complaint right foot History of present illness: Patient was at a party on Saturday, March 15. Was sitting on a chair for several hours.No falls, no trauma Had been off of Mobic as well as reduced dose of tramadol over the last week. The patient did take 2 tramadol this morning as well as meloxicam. This did help some. Past medical history negative for recent cut or scratch on the foot. Was on metformin for diabetes one year ago but has been off this for at least one year. Negative for gout Pain Inventory  Average Pain 8  Pain Right Now 9  My pain is sharp, burning, stabbing and aching  In the last 24 hours, has pain interfered with the following?  General activity 10  Relation with others 10  Enjoyment of life 10  What TIME of day is your pain at its worst? evening  Sleep (in general) Fair  Pain is worse with: bending and some activites  Pain improves with: rest, heat/ice, therapy/exercise, pacing activities, medication, TENS and injections  Relief from Meds: 8  Mobility  walk without assistance  ability to climb steps? yes  do you drive? yes  Do you have any goals in this area? yes  Function  employed # of hrs/week 40  Do you have any goals in this area? yes  Neuro/Psych  tingling  trouble walking  spasms  dizziness  Prior Studies  Any changes since last visit? no  Physicians involved in your care  Any changes since last visit? no    Family History  Problem Relation Age of Onset  . Cancer Mother 71    breast cancer  . Pneumonia Father   . Anesthesia problems Neg Hx   . Hypotension Neg Hx   . Malignant hyperthermia Neg Hx   . Pseudochol deficiency Neg Hx    History   Social History  . Marital Status: Widowed    Spouse Name: N/A    Number of Children: N/A  . Years of Education: N/A   Social History Main Topics  . Smoking status: Never Smoker   .  Smokeless tobacco: Never Used  . Alcohol Use: No  . Drug Use: No  . Sexually Active: No   Other Topics Concern  . None   Social History Narrative  . None   Past Surgical History  Procedure Laterality Date  . Carpal tunnel release  2012  . Wrist surgery      ORIF left wrist and then removed d/t protruding screw in 2012  . Breast surgery  20+yrs ago    left breast;benign  . Tonsillectomy  30+yrs ago  . Abdominal hysterectomy  1975  . Refractive surgery  2011    bilateral  . Colonoscopy    . Anterior cervical decomp/discectomy fusion  07/31/2011    Procedure: ANTERIOR CERVICAL DECOMPRESSION/DISCECTOMY FUSION 3 LEVELS;  Surgeon: Hewitt Shorts, MD;  Location: [facility];  Service: Neurosurgery;  Laterality: N/A;  Cervical four-five,Cervical five-six,Cervical six-seven anterior cervical decompression with fusion plating and bonegraft   Past Medical History  Diagnosis Date  . Sarcoidosis >24yrs ago    no problems since  . Asthma   . High cholesterol     takes Lipitor daily  . DeQuervain's disease (tenosynovitis)   . Hay fever   . Diverticulitis   . Glaucoma(365)   . Hypertension  takes Lotensin HCT daily  . COPD (chronic obstructive pulmonary disease)   . Bronchitis     hx of;>65yrs ago  . Headache     related neck issues  . Dizziness     related to neck issues  . Chronic neck pain     arthritis  . Degenerative disc disease     neck  . Frozen shoulder September 2012  . History of shingles 2012  . GERD (gastroesophageal reflux disease)     takes Ranitidine nightly  . Diabetes mellitus     takes Metformin nightly  . Insomnia     d/t pain and takes Ativan nightly   BP 139/80  Pulse 96  Resp 14  Ht 5\' 5"  (1.651 m)  Wt 149 lb (67.586 kg)  BMI 24.79 kg/m2  SpO2 97%     Review of Systems  Musculoskeletal: Positive for gait problem.  All other systems reviewed and are negative.       Objective:   Physical Exam  Nursing note and vitals  reviewed. Constitutional: She is oriented to person, place, and time. She appears well-developed and well-nourished.  HENT:  Head: Normocephalic and atraumatic.  Eyes: Conjunctivae and EOM are normal. Pupils are equal, round, and reactive to light.  Musculoskeletal:       Right knee: Normal.       Right ankle: She exhibits decreased range of motion and swelling. She exhibits no ecchymosis, no deformity, no laceration and normal pulse. Tenderness. Lateral malleolus and medial malleolus tenderness found. Achilles tendon normal.       Right foot: She exhibits normal range of motion, no tenderness, no bony tenderness, no swelling, no deformity and no laceration.  Effusion And tenderness right ankle joint No pretibial edema  Neurological: She is alert and oriented to person, place, and time.  Psychiatric: She has a normal mood and affect.          Assessment & Plan:  1. Right ankle arthritis most likely gout. Check uric acid as well as sedimentation rate. Give Depo-Medrol injection 40 mg a day and start Medrol Dosepak tomorrow. Off of work for 2 days. Will recheck in 1 week if no better will need to x-ray and possibly aspirate joint fluid. I discussed with the patient this is not anything related to her work injury. She understands this. Over half of the 25 min visit was spent counseling and coordinating care.

## 2012-06-03 ENCOUNTER — Ambulatory Visit: Payer: Self-pay | Admitting: Physical Medicine and Rehabilitation

## 2012-06-03 LAB — URIC ACID: Uric Acid, Serum: 4 mg/dL (ref 2.4–7.0)

## 2012-06-04 ENCOUNTER — Telehealth: Payer: Self-pay | Admitting: *Deleted

## 2012-06-04 NOTE — Telephone Encounter (Signed)
Calling to get lab results.  I notified her that the tests were normal.

## 2012-07-17 ENCOUNTER — Encounter: Payer: Self-pay | Admitting: Physical Medicine & Rehabilitation

## 2012-07-17 ENCOUNTER — Encounter: Payer: PRIVATE HEALTH INSURANCE | Attending: Physical Medicine & Rehabilitation

## 2012-07-17 ENCOUNTER — Ambulatory Visit (HOSPITAL_BASED_OUTPATIENT_CLINIC_OR_DEPARTMENT_OTHER): Payer: PRIVATE HEALTH INSURANCE | Admitting: Physical Medicine & Rehabilitation

## 2012-07-17 VITALS — BP 129/73 | HR 68 | Resp 16 | Ht 65.0 in | Wt 143.0 lb

## 2012-07-17 DIAGNOSIS — E119 Type 2 diabetes mellitus without complications: Secondary | ICD-10-CM | POA: Insufficient documentation

## 2012-07-17 DIAGNOSIS — M47816 Spondylosis without myelopathy or radiculopathy, lumbar region: Secondary | ICD-10-CM

## 2012-07-17 DIAGNOSIS — M7918 Myalgia, other site: Secondary | ICD-10-CM

## 2012-07-17 DIAGNOSIS — H409 Unspecified glaucoma: Secondary | ICD-10-CM | POA: Insufficient documentation

## 2012-07-17 DIAGNOSIS — J449 Chronic obstructive pulmonary disease, unspecified: Secondary | ICD-10-CM | POA: Insufficient documentation

## 2012-07-17 DIAGNOSIS — M19079 Primary osteoarthritis, unspecified ankle and foot: Secondary | ICD-10-CM | POA: Insufficient documentation

## 2012-07-17 DIAGNOSIS — IMO0001 Reserved for inherently not codable concepts without codable children: Secondary | ICD-10-CM

## 2012-07-17 DIAGNOSIS — M47817 Spondylosis without myelopathy or radiculopathy, lumbosacral region: Secondary | ICD-10-CM

## 2012-07-17 DIAGNOSIS — I10 Essential (primary) hypertension: Secondary | ICD-10-CM | POA: Insufficient documentation

## 2012-07-17 DIAGNOSIS — J4489 Other specified chronic obstructive pulmonary disease: Secondary | ICD-10-CM | POA: Insufficient documentation

## 2012-07-17 DIAGNOSIS — K219 Gastro-esophageal reflux disease without esophagitis: Secondary | ICD-10-CM | POA: Insufficient documentation

## 2012-07-17 DIAGNOSIS — M533 Sacrococcygeal disorders, not elsewhere classified: Secondary | ICD-10-CM

## 2012-07-17 NOTE — Progress Notes (Signed)
Subjective:    Patient ID: Sara [name], female    DOB: 1945-08-24, 67 y.o.   MRN: [id number]  HPI Asking about biofreeze, this can be helpful Asking about botox , I didn't think this would be very helpful Doing stretches and exercises Using TENS unit   Losing weight, followup with primary physician. Lab work looks okay Pain Inventory Average Pain 5 Pain Right Now 5 My pain is sharp, burning, dull, stabbing, tingling and aching  In the last 24 hours, has pain interfered with the following? General activity 9 Relation with others 5 Enjoyment of life 8 What TIME of day is your pain at its worst? night Sleep (in general) n/a  Pain is worse with: bending, standing and some activites Pain improves with: rest, heat/ice, therapy/exercise, pacing activities, medication and TENS Relief from Meds: 8  Mobility walk without assistance how many minutes can you walk? 25 ability to climb steps?  yes do you drive?  yes Do you have any goals in this area?  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  yes  Neuro/Psych tingling spasms  Prior Studies Any changes since last visit?  no  Physicians involved in your care Any changes since last visit?  no   Family History  Problem Relation Age of Onset  . Cancer Mother 12    breast cancer  . Pneumonia Father   . Anesthesia problems Neg Hx   . Hypotension Neg Hx   . Malignant hyperthermia Neg Hx   . Pseudochol deficiency Neg Hx    History   Social History  . Marital Status: Widowed    Spouse Name: N/A    Number of Children: N/A  . Years of Education: N/A   Social History Main Topics  . Smoking status: Never Smoker   . Smokeless tobacco: Never Used  . Alcohol Use: No  . Drug Use: No  . Sexually Active: No   Other Topics Concern  . None   Social History Narrative  . None   Past Surgical History  Procedure Laterality Date  . Carpal tunnel  release  2012  . Wrist surgery      ORIF left wrist and then removed d/t protruding screw in 2012  . Breast surgery  20+yrs ago    left breast;benign  . Tonsillectomy  30+yrs ago  . Abdominal hysterectomy  1975  . Refractive surgery  2011    bilateral  . Colonoscopy    . Anterior cervical decomp/discectomy fusion  07/31/2011    Procedure: ANTERIOR CERVICAL DECOMPRESSION/DISCECTOMY FUSION 3 LEVELS;  Surgeon: Hewitt Shorts, MD;  Location: [facility];  Service: Neurosurgery;  Laterality: N/A;  Cervical four-five,Cervical five-six,Cervical six-seven anterior cervical decompression with fusion plating and bonegraft   Past Medical History  Diagnosis Date  . Sarcoidosis >63yrs ago    no problems since  . Asthma   . High cholesterol     takes Lipitor daily  . DeQuervain's disease (tenosynovitis)   . Hay fever   . Diverticulitis   . Glaucoma(365)   . Hypertension     takes Lotensin HCT daily  . COPD (chronic obstructive pulmonary disease)   . Bronchitis     hx of;>39yrs ago  . Headache     related neck issues  . Dizziness     related to neck issues  . Chronic neck pain     arthritis  . Degenerative disc  disease     neck  . Frozen shoulder September 2012  . History of shingles 2012  . GERD (gastroesophageal reflux disease)     takes Ranitidine nightly  . Diabetes mellitus     takes Metformin nightly  . Insomnia     d/t pain and takes Ativan nightly   BP 129/73  Pulse 68  Resp 16  Ht 5\' 5"  (1.651 m)  Wt 143 lb (64.864 kg)  BMI 23.8 kg/m2  SpO2 98%     Review of Systems  Neurological:       Tingling,spasms.  All other systems reviewed and are negative.       Objective:   Physical Exam  Nursing note and vitals reviewed. Constitutional: She appears well-developed and well-nourished.  HENT:  Head: Normocephalic and atraumatic.  Eyes: EOM are normal. Pupils are equal, round, and reactive to light.  Musculoskeletal:       Lumbar back: She exhibits decreased  range of motion.  Right PSIS tenderness Coccyx tenderness as well   Neurological: She has normal strength. No sensory deficit.  Psychiatric: She has a normal mood and affect.          Assessment & Plan:  1.  Right Sacroiliac joint problem,If another flareup then sacroiliac injection. Otherwise continue current medications 2.  Myofascial pain trial biofreeze 3. Right ankle pain resolved. Tests show no evidence of arthritic condition however I do suspect this may have been gout. If this happens again followup with her primary MD

## 2012-07-17 NOTE — Patient Instructions (Addendum)
If your R ankle flares up again please see her family physician

## 2012-07-31 LAB — HM DEXA SCAN

## 2012-08-07 ENCOUNTER — Other Ambulatory Visit: Payer: Self-pay

## 2012-08-07 MED ORDER — METHOCARBAMOL 500 MG PO TABS: 500.0000 mg | ORAL_TABLET | Freq: Three times a day (TID) | ORAL | Status: DC

## 2012-09-09 ENCOUNTER — Other Ambulatory Visit (HOSPITAL_COMMUNITY): Payer: Self-pay | Admitting: Orthopedic Surgery

## 2012-09-09 DIAGNOSIS — M545 Low back pain: Secondary | ICD-10-CM

## 2012-09-11 ENCOUNTER — Ambulatory Visit (HOSPITAL_COMMUNITY)
Admission: RE | Admit: 2012-09-11 | Discharge: 2012-09-11 | Disposition: A | Discharge status: Home / Self Care | Payer: 59 | Source: Ambulatory Visit | Attending: Orthopedic Surgery | Admitting: Orthopedic Surgery

## 2012-09-11 DIAGNOSIS — M79609 Pain in unspecified limb: Secondary | ICD-10-CM | POA: Insufficient documentation

## 2012-09-11 DIAGNOSIS — M47817 Spondylosis without myelopathy or radiculopathy, lumbosacral region: Secondary | ICD-10-CM | POA: Insufficient documentation

## 2012-09-11 DIAGNOSIS — M545 Low back pain: Secondary | ICD-10-CM

## 2012-12-02 ENCOUNTER — Ambulatory Visit: Payer: PRIVATE HEALTH INSURANCE | Attending: Orthopedic Surgery | Admitting: Physical Therapy

## 2012-12-02 DIAGNOSIS — M545 Low back pain, unspecified: Secondary | ICD-10-CM | POA: Insufficient documentation

## 2012-12-02 DIAGNOSIS — IMO0001 Reserved for inherently not codable concepts without codable children: Secondary | ICD-10-CM | POA: Insufficient documentation

## 2012-12-02 DIAGNOSIS — M25559 Pain in unspecified hip: Secondary | ICD-10-CM | POA: Insufficient documentation

## 2012-12-07 ENCOUNTER — Ambulatory Visit: Payer: 59 | Attending: Orthopedic Surgery | Admitting: Physical Therapy

## 2012-12-07 DIAGNOSIS — M545 Low back pain, unspecified: Secondary | ICD-10-CM | POA: Insufficient documentation

## 2012-12-07 DIAGNOSIS — M25559 Pain in unspecified hip: Secondary | ICD-10-CM | POA: Insufficient documentation

## 2012-12-07 DIAGNOSIS — IMO0001 Reserved for inherently not codable concepts without codable children: Secondary | ICD-10-CM | POA: Insufficient documentation

## 2012-12-10 ENCOUNTER — Ambulatory Visit: Payer: 59 | Admitting: Physical Therapy

## 2012-12-14 ENCOUNTER — Ambulatory Visit: Payer: 59 | Admitting: Physical Therapy

## 2012-12-16 ENCOUNTER — Ambulatory Visit: Payer: PRIVATE HEALTH INSURANCE | Admitting: Physical Therapy

## 2012-12-18 ENCOUNTER — Encounter: Payer: Self-pay | Admitting: Physical Therapy

## 2012-12-21 ENCOUNTER — Ambulatory Visit: Payer: PRIVATE HEALTH INSURANCE | Attending: Orthopedic Surgery | Admitting: Physical Therapy

## 2012-12-21 DIAGNOSIS — M545 Low back pain, unspecified: Secondary | ICD-10-CM | POA: Insufficient documentation

## 2012-12-21 DIAGNOSIS — IMO0001 Reserved for inherently not codable concepts without codable children: Secondary | ICD-10-CM | POA: Insufficient documentation

## 2012-12-21 DIAGNOSIS — M25559 Pain in unspecified hip: Secondary | ICD-10-CM | POA: Insufficient documentation

## 2012-12-23 ENCOUNTER — Ambulatory Visit: Payer: PRIVATE HEALTH INSURANCE | Admitting: Physical Therapy

## 2012-12-28 ENCOUNTER — Ambulatory Visit: Payer: PRIVATE HEALTH INSURANCE | Admitting: Physical Therapy

## 2012-12-30 ENCOUNTER — Ambulatory Visit: Payer: PRIVATE HEALTH INSURANCE | Admitting: Physical Therapy

## 2013-01-04 ENCOUNTER — Ambulatory Visit: Payer: PRIVATE HEALTH INSURANCE | Admitting: Physical Therapy

## 2013-01-06 ENCOUNTER — Ambulatory Visit: Payer: 59 | Attending: Internal Medicine | Admitting: Physical Therapy

## 2013-01-06 DIAGNOSIS — M545 Low back pain, unspecified: Secondary | ICD-10-CM | POA: Insufficient documentation

## 2013-01-06 DIAGNOSIS — IMO0001 Reserved for inherently not codable concepts without codable children: Secondary | ICD-10-CM | POA: Insufficient documentation

## 2013-01-06 DIAGNOSIS — M25559 Pain in unspecified hip: Secondary | ICD-10-CM | POA: Insufficient documentation

## 2013-01-11 ENCOUNTER — Ambulatory Visit: Payer: PRIVATE HEALTH INSURANCE | Admitting: Physical Therapy

## 2013-01-13 ENCOUNTER — Ambulatory Visit: Payer: 59 | Admitting: Physical Therapy

## 2013-01-18 ENCOUNTER — Ambulatory Visit: Payer: PRIVATE HEALTH INSURANCE | Attending: Orthopedic Surgery | Admitting: Physical Therapy

## 2013-01-18 DIAGNOSIS — M545 Low back pain, unspecified: Secondary | ICD-10-CM | POA: Insufficient documentation

## 2013-01-18 DIAGNOSIS — M25559 Pain in unspecified hip: Secondary | ICD-10-CM | POA: Insufficient documentation

## 2013-01-18 DIAGNOSIS — IMO0001 Reserved for inherently not codable concepts without codable children: Secondary | ICD-10-CM | POA: Insufficient documentation

## 2013-01-20 ENCOUNTER — Ambulatory Visit: Payer: PRIVATE HEALTH INSURANCE | Admitting: Physical Therapy

## 2013-01-21 ENCOUNTER — Other Ambulatory Visit: Payer: Self-pay

## 2013-01-22 ENCOUNTER — Ambulatory Visit: Payer: 59 | Admitting: Physical Medicine & Rehabilitation

## 2013-01-22 ENCOUNTER — Encounter: Payer: 59 | Attending: Physical Medicine & Rehabilitation

## 2013-01-25 ENCOUNTER — Ambulatory Visit: Payer: PRIVATE HEALTH INSURANCE | Admitting: Physical Therapy

## 2013-01-27 ENCOUNTER — Ambulatory Visit: Payer: PRIVATE HEALTH INSURANCE | Admitting: Physical Therapy

## 2013-02-01 ENCOUNTER — Ambulatory Visit: Payer: PRIVATE HEALTH INSURANCE | Admitting: Physical Therapy

## 2013-02-03 ENCOUNTER — Ambulatory Visit: Payer: PRIVATE HEALTH INSURANCE | Admitting: Physical Therapy

## 2013-02-05 ENCOUNTER — Encounter: Payer: Self-pay | Admitting: Internal Medicine

## 2013-02-05 DIAGNOSIS — H409 Unspecified glaucoma: Secondary | ICD-10-CM | POA: Insufficient documentation

## 2013-02-05 DIAGNOSIS — E1129 Type 2 diabetes mellitus with other diabetic kidney complication: Secondary | ICD-10-CM | POA: Insufficient documentation

## 2013-02-05 DIAGNOSIS — M47812 Spondylosis without myelopathy or radiculopathy, cervical region: Secondary | ICD-10-CM | POA: Insufficient documentation

## 2013-02-05 DIAGNOSIS — E1169 Type 2 diabetes mellitus with other specified complication: Secondary | ICD-10-CM | POA: Insufficient documentation

## 2013-02-05 DIAGNOSIS — J301 Allergic rhinitis due to pollen: Secondary | ICD-10-CM | POA: Insufficient documentation

## 2013-02-05 DIAGNOSIS — J45909 Unspecified asthma, uncomplicated: Secondary | ICD-10-CM | POA: Insufficient documentation

## 2013-02-05 DIAGNOSIS — I10 Essential (primary) hypertension: Secondary | ICD-10-CM | POA: Insufficient documentation

## 2013-02-05 DIAGNOSIS — J449 Chronic obstructive pulmonary disease, unspecified: Secondary | ICD-10-CM | POA: Insufficient documentation

## 2013-02-05 DIAGNOSIS — K219 Gastro-esophageal reflux disease without esophagitis: Secondary | ICD-10-CM | POA: Insufficient documentation

## 2013-02-08 ENCOUNTER — Ambulatory Visit: Payer: PRIVATE HEALTH INSURANCE | Admitting: Physical Therapy

## 2013-02-09 ENCOUNTER — Encounter: Payer: Self-pay | Admitting: Physician Assistant

## 2013-02-09 ENCOUNTER — Ambulatory Visit: Payer: Self-pay | Admitting: Physician Assistant

## 2013-02-09 VITALS — BP 102/68 | HR 76 | Temp 98.0°F | Resp 16 | Ht 65.0 in | Wt 149.0 lb

## 2013-02-09 DIAGNOSIS — E119 Type 2 diabetes mellitus without complications: Secondary | ICD-10-CM

## 2013-02-09 DIAGNOSIS — I10 Essential (primary) hypertension: Secondary | ICD-10-CM

## 2013-02-09 DIAGNOSIS — E559 Vitamin D deficiency, unspecified: Secondary | ICD-10-CM

## 2013-02-09 DIAGNOSIS — E7800 Pure hypercholesterolemia, unspecified: Secondary | ICD-10-CM

## 2013-02-09 DIAGNOSIS — Z79899 Other long term (current) drug therapy: Secondary | ICD-10-CM

## 2013-02-09 LAB — HEPATIC FUNCTION PANEL
ALT: 9 U/L (ref 0–35)
Albumin: 4.7 g/dL (ref 3.5–5.2)
Indirect Bilirubin: 0.5 mg/dL (ref 0.0–0.9)
Total Protein: 7 g/dL (ref 6.0–8.3)

## 2013-02-09 LAB — CBC WITH DIFFERENTIAL/PLATELET
Basophils Absolute: 0 10*3/uL (ref 0.0–0.1)
Basophils Relative: 1 % (ref 0–1)
Eosinophils Absolute: 0.1 10*3/uL (ref 0.0–0.7)
Eosinophils Relative: 1 % (ref 0–5)
HCT: 37.7 % (ref 36.0–46.0)
Hemoglobin: 13.1 g/dL (ref 12.0–15.0)
MCH: 30 pg (ref 26.0–34.0)
MCHC: 34.7 g/dL (ref 30.0–36.0)
MCV: 86.5 fL (ref 78.0–100.0)
Monocytes Absolute: 0.4 10*3/uL (ref 0.1–1.0)
Monocytes Relative: 10 % (ref 3–12)
Neutro Abs: 2.1 10*3/uL (ref 1.7–7.7)
RDW: 14.1 % (ref 11.5–15.5)

## 2013-02-09 LAB — LIPID PANEL
HDL: 68 mg/dL (ref >39)
LDL Cholesterol: 83 mg/dL (ref 0–99)
Triglycerides: 43 mg/dL (ref <150)

## 2013-02-09 LAB — BASIC METABOLIC PANEL WITH GFR
BUN: 17 mg/dL (ref 6–23)
Calcium: 10.1 mg/dL (ref 8.4–10.5)
Creat: 0.9 mg/dL (ref 0.50–1.10)
GFR, Est African American: 78 mL/min
GFR, Est Non African American: 67 mL/min
Glucose, Bld: 96 mg/dL (ref 70–99)

## 2013-02-09 LAB — HEMOGLOBIN A1C: Mean Plasma Glucose: 128 mg/dL — ABNORMAL HIGH (ref <117)

## 2013-02-09 LAB — TSH: TSH: 0.946 u[IU]/mL (ref 0.350–4.500)

## 2013-02-09 MED ORDER — RANITIDINE HCL 300 MG PO CAPS: 300.0000 mg | ORAL_CAPSULE | Freq: Every evening | ORAL | Status: DC

## 2013-02-09 NOTE — Patient Instructions (Signed)

## 2013-02-09 NOTE — Progress Notes (Signed)
HPI Patient presents for 3 month follow up with hypertension, hyperlipidemia, prediabetes and vitamin D. Patient's blood pressure has been controlled at home. Patient denies chest pain, shortness of breath, dizziness.  Patient's cholesterol is diet controlled. In addition they are on lipitor and denies myalgias. The cholesterol last visit was LDL 68. The patient has been working on diet and exercise for prediabetes, and denies changes in vision, polys, and paresthesias. A1C 6.4 Patient is on Vitamin D supplement.  Current Medications:  Current Outpatient Prescriptions on File Prior to Visit  Medication Sig Dispense Refill  . acetaminophen (TYLENOL ARTHRITIS PAIN) 650 MG CR tablet Take 1,300 mg by mouth every 8 (eight) hours as needed. For pain      . atorvastatin (LIPITOR) 40 MG tablet Take 20 mg by mouth daily.       . benazepril-hydrochlorthiazide (LOTENSIN HCT) 20-25 MG per tablet Take 0.5 tablets by mouth daily.       . betaxolol (BETOPTIC-S) 0.25 % ophthalmic suspension Place 1 drop into both eyes 2 (two) times daily.      . diclofenac (FLECTOR) 1.3 % PTCH Place 1 patch onto the skin 2 (two) times daily.  60 patch  1  . dorzolamide (TRUSOPT) 2 % ophthalmic solution Place 1 drop into both eyes 2 (two) times daily.      Marland Kitchen LORazepam (ATIVAN) 2 MG tablet Take 1 mg by mouth at bedtime.      . metFORMIN (GLUMETZA) 500 MG (MOD) 24 hr tablet Take 500 mg by mouth at bedtime.       . methocarbamol (ROBAXIN) 500 MG tablet Take 1 tablet (500 mg total) by mouth 3 (three) times daily.  90 tablet  1  . methylPREDNISolone (MEDROL, PAK,) 4 MG tablet follow package directions  21 tablet  0  . ranitidine (ZANTAC) 300 MG capsule Take 300 mg by mouth every evening.       . traMADol (ULTRAM) 50 MG tablet Take 1 tablet (50 mg total) by mouth every 8 (eight) hours as needed for pain.  90 tablet  2  . travoprost, benzalkonium, (TRAVATAN) 0.004 % ophthalmic solution Place 1 drop into both eyes at bedtime.       No  current facility-administered medications on file prior to visit.   Medical History:  Past Medical History  Diagnosis Date  . Sarcoidosis >39yrs ago    no problems since  . DeQuervain's disease (tenosynovitis)   . Diverticulitis   . Bronchitis     hx of;>79yrs ago  . Headache(784.0)     related neck issues  . Dizziness     related to neck issues  . Chronic neck pain     arthritis  . History of shingles 2012  . Insomnia     d/t pain and takes Ativan nightly  . Diabetes mellitus     takes Metformin nightly  . GERD (gastroesophageal reflux disease)     takes Ranitidine nightly  . Glaucoma   . High cholesterol     takes Lipitor daily  . Hypertension     takes Lotensin HCT daily  . COPD (chronic obstructive pulmonary disease)   . Asthma   . Degenerative disc disease     neck  . Frozen shoulder September 2012  . Hay fever    Allergies:  Allergies  Allergen Reactions  . Penicillins Shortness Of Breath    And hives  . Aspirin Other (See Comments)    Stomach upset, but low dose is ok  .  Sulfa Antibiotics Other (See Comments)    unknown  . Codeine Rash    ROS Constitutional: Denies fever, chills, weight loss/gain, headaches, insomnia, fatigue, night sweats, and change in appetite. Eyes: Denies redness, blurred vision, diplopia, discharge, itchy, watery eyes.  ENT: Denies discharge, congestion, post nasal drip, sore throat, earache, dental pain, Tinnitus, Vertigo, Sinus pain, snoring.  Cardio: Denies chest pain, palpitations, irregular heartbeat,  dyspnea, diaphoresis, orthopnea, PND, claudication, edema Respiratory: denies cough, dyspnea,pleurisy, hoarseness, wheezing.  Gastrointestinal: Denies dysphagia, heartburn,  water brash, pain, cramps, nausea, vomiting, bloating, diarrhea, constipation, hematemesis, melena, hematochezia,  hemorrhoids Genitourinary:+ incontinence but working with PT and it is better Denies dysuria, frequency, urgency, nocturia, hesitancy, discharge,  hematuria, flank pain Musculoskeletal: +back pain sees ortho Denies arthralgia, myalgia, stiffness, Jt. Swelling, pain, limp, and strain/sprain. Skin: Denies pruritis, rash, hives, warts, acne, eczema, changing in skin lesion Neuro: Weakness, tremor, incoordination, spasms, paresthesia, pain Psychiatric: Denies confusion, memory loss, sensory loss Endocrine: Denies change in weight, skin, hair change, nocturia, and paresthesia, Diabetic Polys, visual blurring, hyper /hypo glycemic episodes.  Heme/Lymph: Excessive bleeding, bruising, enlarged lymph nodes  Family history- Review and unchanged Social history- Review and unchanged Physical Exam: Filed Vitals:   02/09/13 0948  BP: 102/68  Pulse: 76  Temp: 98 F (36.7 C)  Resp: 16   Filed Weights   02/09/13 0948  Weight: 149 lb (67.586 kg)   General Appearance: Well nourished, in no apparent distress. Eyes: PERRLA, EOMs, conjunctiva no swelling or erythema, normal fundi and vessels. Sinuses: No Frontal/maxillary tenderness ENT/Mouth: Ext aud canals clear, with TMs without erythema, bulging.No erythema, swelling, or exudate on post pharynx.  Tonsils not swollen or erythematous. Hearing normal.  Neck: Supple, thyroid normal.  Respiratory: Respiratory effort normal, BS equal bilaterally without rales, rhonchi, wheezing or stridor.  Cardio: Heart sounds normal, regular rate and rhythm without murmurs, rubs or gallops. Peripheral pulses brisk and equal bilaterally, without edema.  Abdomen: Flat, soft, with bowel sounds. Non tender, no guarding, rebound, hernias, masses, or organomegaly.  Lymphatics: Non tender without lymphadenopathy.  Musculoskeletal: Full ROM all peripheral extremities, joint stability, 5/5 strength, and normal gait. Skin: Warm, dry without rashes, lesions, ecchymosis.  Neuro: Cranial nerves intact, reflexes equal bilaterally. Normal muscle tone, no cerebellar symptoms. Sensation intact.  Psych: Awake and oriented X 3,  normal affect, Insight and Judgment appropriate.   Assessment and Plan:  Hypertension: Continue medication, monitor blood pressure at home. Continue DASH diet. Cholesterol: Continue diet and exercise. Check cholesterol.  Pre-diabetes-Continue diet and exercise. Check A1C Vitamin D Def- check level and continue medications.  Hypomagnesium- check level has had leg cramping  Quentin Mulling 9:57 AM

## 2013-02-10 ENCOUNTER — Ambulatory Visit: Payer: PRIVATE HEALTH INSURANCE | Admitting: Physical Therapy

## 2013-02-10 LAB — VITAMIN D 25 HYDROXY (VIT D DEFICIENCY, FRACTURES): Vit D, 25-Hydroxy: 68 ng/mL (ref 30–89)

## 2013-02-10 LAB — INSULIN, FASTING: Insulin fasting, serum: 10 u[IU]/mL (ref 3–28)

## 2013-02-15 ENCOUNTER — Encounter: Payer: PRIVATE HEALTH INSURANCE | Admitting: Physical Therapy

## 2013-02-17 ENCOUNTER — Encounter: Payer: PRIVATE HEALTH INSURANCE | Admitting: Physical Therapy

## 2013-02-22 ENCOUNTER — Encounter: Payer: PRIVATE HEALTH INSURANCE | Admitting: Physical Therapy

## 2013-02-24 ENCOUNTER — Ambulatory Visit: Payer: Self-pay | Admitting: Emergency Medicine

## 2013-02-24 ENCOUNTER — Encounter: Payer: PRIVATE HEALTH INSURANCE | Admitting: Physical Therapy

## 2013-02-25 ENCOUNTER — Ambulatory Visit: Payer: Self-pay | Admitting: Emergency Medicine

## 2013-02-25 ENCOUNTER — Ambulatory Visit (INDEPENDENT_AMBULATORY_CARE_PROVIDER_SITE_OTHER): Payer: 59 | Admitting: Physician Assistant

## 2013-02-25 ENCOUNTER — Encounter: Payer: Self-pay | Admitting: Physician Assistant

## 2013-02-25 VITALS — BP 128/72 | HR 76 | Temp 97.7°F | Resp 16 | Ht 65.0 in | Wt 153.0 lb

## 2013-02-25 DIAGNOSIS — J019 Acute sinusitis, unspecified: Secondary | ICD-10-CM

## 2013-02-25 MED ORDER — HYDROCODONE-ACETAMINOPHEN 5-325 MG PO TABS: ORAL_TABLET | ORAL | Status: DC

## 2013-02-25 MED ORDER — AZITHROMYCIN 250 MG PO TABS: ORAL_TABLET | ORAL | Status: DC

## 2013-02-25 MED ORDER — PREDNISONE 20 MG PO TABS: ORAL_TABLET | ORAL | Status: DC

## 2013-02-25 NOTE — Progress Notes (Signed)
   Subjective:    Patient ID: Sara [name], female    DOB: 02-11-1946, 67 y.o.   MRN: [id number]  URI  This is a new problem. The current episode started in the past 7 days. The problem has been gradually worsening. There has been no fever. Associated symptoms include congestion, coughing, headaches, a plugged ear sensation, rhinorrhea, sinus pain, sneezing and a sore throat. Pertinent negatives include no abdominal pain, chest pain, diarrhea, dysuria, ear pain, joint pain, joint swelling, nausea, neck pain, rash, swollen glands, vomiting or wheezing. She has tried nothing for the symptoms.    Review of Systems  Constitutional: Negative.   HENT: Positive for congestion, rhinorrhea, sinus pressure, sneezing, sore throat and voice change. Negative for ear pain, nosebleeds, postnasal drip, tinnitus and trouble swallowing.   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  Respiratory: Positive for cough. Negative for choking, chest tightness, shortness of breath, wheezing and stridor.   Cardiovascular: Negative for chest pain.  Gastrointestinal: Negative for nausea, vomiting, abdominal pain and diarrhea.  Genitourinary: Negative.  Negative for dysuria.  Musculoskeletal: Negative.  Negative for joint pain and neck pain.  Skin: Negative for rash.  Neurological: Positive for headaches.       Objective:   Physical Exam  Vitals reviewed. Constitutional: She appears well-developed and well-nourished.  HENT:  Head: Normocephalic and atraumatic.  Right Ear: External ear normal.  Nose: Right sinus exhibits frontal sinus tenderness. Left sinus exhibits frontal sinus tenderness.  Eyes: Conjunctivae and EOM are normal. Pupils are equal, round, and reactive to light.  Neck: Normal range of motion. Neck supple.  Cardiovascular: Normal rate, regular rhythm, normal heart sounds and intact distal pulses.   Pulmonary/Chest: Effort normal and breath sounds normal. No respiratory  distress. She has no wheezes.  Abdominal: Soft. Bowel sounds are normal.  Lymphadenopathy:    She has no cervical adenopathy.  Skin: Skin is warm and dry.      Assessment & Plan:  Acute sinusitis, unspecified - Plan: azithromycin (ZITHROMAX) 250 MG tablet, HYDROcodone-acetaminophen (NORCO) 5-325 MG per tablet, predniSONE (DELTASONE) 20 MG tablet

## 2013-02-25 NOTE — Patient Instructions (Signed)
Okay to take claritin (loratadine) , allegra (fexafinadine) or zyrtec (certazine take at night)

## 2013-03-04 ENCOUNTER — Other Ambulatory Visit: Payer: Self-pay | Admitting: Emergency Medicine

## 2013-03-09 ENCOUNTER — Other Ambulatory Visit (HOSPITAL_COMMUNITY): Payer: Self-pay | Admitting: Orthopedic Surgery

## 2013-03-09 DIAGNOSIS — M25512 Pain in left shoulder: Secondary | ICD-10-CM

## 2013-03-17 ENCOUNTER — Ambulatory Visit (HOSPITAL_COMMUNITY)
Admission: RE | Admit: 2013-03-17 | Discharge: 2013-03-17 | Disposition: A | Discharge status: Home / Self Care | Payer: PRIVATE HEALTH INSURANCE | Source: Ambulatory Visit | Attending: Orthopedic Surgery | Admitting: Orthopedic Surgery

## 2013-03-17 DIAGNOSIS — M719 Bursopathy, unspecified: Secondary | ICD-10-CM | POA: Insufficient documentation

## 2013-03-17 DIAGNOSIS — M25512 Pain in left shoulder: Secondary | ICD-10-CM

## 2013-03-17 DIAGNOSIS — M24119 Other articular cartilage disorders, unspecified shoulder: Secondary | ICD-10-CM | POA: Insufficient documentation

## 2013-03-17 DIAGNOSIS — M67919 Unspecified disorder of synovium and tendon, unspecified shoulder: Secondary | ICD-10-CM | POA: Insufficient documentation

## 2013-04-26 ENCOUNTER — Encounter (HOSPITAL_COMMUNITY): Payer: Self-pay | Admitting: Emergency Medicine

## 2013-04-26 ENCOUNTER — Emergency Department (HOSPITAL_COMMUNITY)
Admission: EM | Admit: 2013-04-26 | Discharge: 2013-04-26 | Disposition: A | Discharge status: Home / Self Care | Payer: 59 | Attending: Emergency Medicine | Admitting: Emergency Medicine

## 2013-04-26 ENCOUNTER — Emergency Department (HOSPITAL_COMMUNITY): Payer: 59

## 2013-04-26 DIAGNOSIS — Z79899 Other long term (current) drug therapy: Secondary | ICD-10-CM | POA: Insufficient documentation

## 2013-04-26 DIAGNOSIS — R0789 Other chest pain: Secondary | ICD-10-CM | POA: Insufficient documentation

## 2013-04-26 DIAGNOSIS — K219 Gastro-esophageal reflux disease without esophagitis: Secondary | ICD-10-CM | POA: Insufficient documentation

## 2013-04-26 DIAGNOSIS — E119 Type 2 diabetes mellitus without complications: Secondary | ICD-10-CM | POA: Insufficient documentation

## 2013-04-26 DIAGNOSIS — R079 Chest pain, unspecified: Secondary | ICD-10-CM

## 2013-04-26 DIAGNOSIS — J4489 Other specified chronic obstructive pulmonary disease: Secondary | ICD-10-CM | POA: Insufficient documentation

## 2013-04-26 DIAGNOSIS — Z8619 Personal history of other infectious and parasitic diseases: Secondary | ICD-10-CM | POA: Insufficient documentation

## 2013-04-26 DIAGNOSIS — Z880 Allergy status to penicillin: Secondary | ICD-10-CM | POA: Insufficient documentation

## 2013-04-26 DIAGNOSIS — H409 Unspecified glaucoma: Secondary | ICD-10-CM | POA: Insufficient documentation

## 2013-04-26 DIAGNOSIS — Z7982 Long term (current) use of aspirin: Secondary | ICD-10-CM | POA: Insufficient documentation

## 2013-04-26 DIAGNOSIS — I10 Essential (primary) hypertension: Secondary | ICD-10-CM | POA: Insufficient documentation

## 2013-04-26 DIAGNOSIS — E7800 Pure hypercholesterolemia, unspecified: Secondary | ICD-10-CM | POA: Insufficient documentation

## 2013-04-26 DIAGNOSIS — J449 Chronic obstructive pulmonary disease, unspecified: Secondary | ICD-10-CM | POA: Insufficient documentation

## 2013-04-26 DIAGNOSIS — M542 Cervicalgia: Secondary | ICD-10-CM | POA: Insufficient documentation

## 2013-04-26 DIAGNOSIS — G8929 Other chronic pain: Secondary | ICD-10-CM | POA: Insufficient documentation

## 2013-04-26 DIAGNOSIS — M549 Dorsalgia, unspecified: Secondary | ICD-10-CM | POA: Insufficient documentation

## 2013-04-26 LAB — CBC
HCT: 37.7 % (ref 36.0–46.0)
HEMOGLOBIN: 12.5 g/dL (ref 12.0–15.0)
MCH: 30.1 pg (ref 26.0–34.0)
MCHC: 33.2 g/dL (ref 30.0–36.0)
MCV: 90.8 fL (ref 78.0–100.0)
Platelets: 257 10*3/uL (ref 150–400)
RBC: 4.15 MIL/uL (ref 3.87–5.11)
RDW: 13.4 % (ref 11.5–15.5)
WBC: 3.9 10*3/uL — ABNORMAL LOW (ref 4.0–10.5)

## 2013-04-26 LAB — BASIC METABOLIC PANEL
BUN: 13 mg/dL (ref 6–23)
CALCIUM: 9.6 mg/dL (ref 8.4–10.5)
CO2: 29 mEq/L (ref 19–32)
Chloride: 102 mEq/L (ref 96–112)
Creatinine, Ser: 0.78 mg/dL (ref 0.50–1.10)
GFR, EST NON AFRICAN AMERICAN: 86 mL/min — AB (ref >90)
GLUCOSE: 108 mg/dL — AB (ref 70–99)
POTASSIUM: 4.7 meq/L (ref 3.7–5.3)
Sodium: 140 mEq/L (ref 137–147)

## 2013-04-26 LAB — POCT I-STAT TROPONIN I: Troponin i, poc: 0.01 ng/mL (ref 0.00–0.08)

## 2013-04-26 LAB — TROPONIN I

## 2013-04-26 MED ORDER — OXYCODONE-ACETAMINOPHEN 5-325 MG PO TABS
2.0000 | ORAL_TABLET | Freq: Once | ORAL | Status: AC
  Administered 2013-04-26: 2 via ORAL
  Filled 2013-04-26: qty 2

## 2013-04-26 MED ORDER — OXYCODONE-ACETAMINOPHEN 5-325 MG PO TABS: ORAL_TABLET | ORAL | Status: DC

## 2013-04-26 NOTE — Discharge Instructions (Signed)
Chest Pain (Nonspecific) °It is often hard to give a specific diagnosis for the cause of chest pain. There is always a chance that your pain could be related to something serious, such as a heart attack or a blood clot in the lungs. You need to follow up with your caregiver for further evaluation. °CAUSES  °· Heartburn. °· Pneumonia or bronchitis. °· Anxiety or stress. °· Inflammation around your heart (pericarditis) or lung (pleuritis or pleurisy). °· A blood clot in the lung. °· A collapsed lung (pneumothorax). It can develop suddenly on its own (spontaneous pneumothorax) or from injury (trauma) to the chest. °· Shingles infection (herpes zoster virus). °The chest wall is composed of bones, muscles, and cartilage. Any of these can be the source of the pain. °· The bones can be bruised by injury. °· The muscles or cartilage can be strained by coughing or overwork. °· The cartilage can be affected by inflammation and become sore (costochondritis). °DIAGNOSIS  °Lab tests or other studies, such as X-rays, electrocardiography, stress testing, or cardiac imaging, may be needed to find the cause of your pain.  °TREATMENT  °· Treatment depends on what may be causing your chest pain. Treatment may include: °· Acid blockers for heartburn. °· Anti-inflammatory medicine. °· Pain medicine for inflammatory conditions. °· Antibiotics if an infection is present. °· You may be advised to change lifestyle habits. This includes stopping smoking and avoiding alcohol, caffeine, and chocolate. °· You may be advised to keep your head raised (elevated) when sleeping. This reduces the chance of acid going backward from your stomach into your esophagus. °· Most of the time, nonspecific chest pain will improve within 2 to 3 days with rest and mild pain medicine. °HOME CARE INSTRUCTIONS  °· If antibiotics were prescribed, take your antibiotics as directed. Finish them even if you start to feel better. °· For the next few days, avoid physical  activities that bring on chest pain. Continue physical activities as directed. °· Do not smoke. °· Avoid drinking alcohol. °· Only take over-the-counter or prescription medicine for pain, discomfort, or fever as directed by your caregiver. °· Follow your caregiver's suggestions for further testing if your chest pain does not go away. °· Keep any follow-up appointments you made. If you do not go to an appointment, you could develop lasting (chronic) problems with pain. If there is any problem keeping an appointment, you must call to reschedule. °SEEK MEDICAL CARE IF:  °· You think you are having problems from the medicine you are taking. Read your medicine instructions carefully. °· Your chest pain does not go away, even after treatment. °· You develop a rash with blisters on your chest. °SEEK IMMEDIATE MEDICAL CARE IF:  °· You have increased chest pain or pain that spreads to your arm, neck, jaw, back, or abdomen. °· You develop shortness of breath, an increasing cough, or you are coughing up blood. °· You have severe back or abdominal pain, feel nauseous, or vomit. °· You develop severe weakness, fainting, or chills. °· You have a fever. °THIS IS AN EMERGENCY. Do not wait to see if the pain will go away. Get medical help at once. Call your local emergency services (911 in [place] Do not drive yourself to the hospital. °MAKE SURE YOU:  °· Understand these instructions. °· Will watch your condition. °· Will get help right away if you are not doing well or get worse. °Document Released: 12/12/2004 Document Revised: 05/27/2011 Document Reviewed: 10/08/2007 °[employer]® Patient Information ©2014 [employer]   [employer]. ° °

## 2013-04-26 NOTE — ED Notes (Signed)
PT states she has chest pain for past 3 days. Pt states she went to orthopedic doctor today and was told to come here. Pt denies any exertion. No sob, dizziness or lightheadedness.

## 2013-04-26 NOTE — ED Provider Notes (Signed)
CSN: [id number]     Arrival date & time 04/26/13  1720 History   First MD Initiated Contact with Patient 04/26/13 1940     Chief Complaint  Patient presents with  . Chest Pain     (Consider location/radiation/quality/duration/timing/severity/associated sxs/prior Treatment) HPI Pt is a 68yo female with hx of sarcoidosis, chronic neck pain, GERD, HTN, COPD, DDD, frozen shoulder and osteoporosis c/o chest pain and back pain that radiate back and forth and down right arm since Saturday, 2/7. Pain is sharp and aching in nature, 7/10 at rest, 10/10 with movement. Pt was reports being told if she ever has chest pain to report to orthopedist office immediately due to hx of ACDF in 2013.  Pt states this is the first time she has experienced chest pain since the cervical spine fusion, was evaluated at the orthopedist office earlier today for same pain and advised to come to ED, pt states they told her she was having a heart attack.  Denies personal or family hx of CAD.  Denies SOB, dizziness or lightheadedness. Denies nausea or diaphoresis. Denies recent illness, fever, n/v/d, or cough. Denies heavy lifting or fall. Has taken "half" of hydrocodone w/o relief.  Also reports hx of torn rotator cuff which she has been advised by her orthopedist needs to be fixed.   Past Medical History  Diagnosis Date  . Sarcoidosis >84yrs ago    no problems since  . DeQuervain's disease (tenosynovitis)   . Diverticulitis   . Bronchitis     hx of;>64yrs ago  . Headache(784.0)     related neck issues  . Dizziness     related to neck issues  . Chronic neck pain     arthritis  . History of shingles 2012  . Insomnia     d/t pain and takes Ativan nightly  . GERD (gastroesophageal reflux disease)     takes Ranitidine nightly  . Glaucoma   . High cholesterol     takes Lipitor daily  . Hypertension     takes Lotensin HCT daily  . COPD (chronic obstructive pulmonary disease)   . Asthma   . Degenerative disc disease      neck  . Frozen shoulder September 2012  . Hay fever   . Type II or unspecified type diabetes mellitus without mention of complication, not stated as uncontrolled     takes Metformin nightly  . Osteoporosis    Past Surgical History  Procedure Laterality Date  . Carpal tunnel release  2012  . Wrist surgery      ORIF left wrist and then removed d/t protruding screw in 2012  . Breast surgery  20+yrs ago    left breast;benign  . Tonsillectomy  30+yrs ago  . Abdominal hysterectomy  1975  . Refractive surgery  2011    bilateral  . Colonoscopy    . Anterior cervical decomp/discectomy fusion  07/31/2011    Procedure: ANTERIOR CERVICAL DECOMPRESSION/DISCECTOMY FUSION 3 LEVELS;  Surgeon: Hosie Spangle, MD;  Location: [facility] [facility];  Service: Neurosurgery;  Laterality: N/A;  Cervical four-five,Cervical five-six,Cervical six-seven anterior cervical decompression with fusion plating and bonegraft   Family History  Problem Relation Age of Onset  . Cancer Mother 21    breast cancer  . Pneumonia Father   . Leukemia Father   . Glaucoma Father   . Anesthesia problems Neg Hx   . Hypotension Neg Hx   . Malignant hyperthermia Neg Hx   . Pseudochol deficiency Neg Hx   .  Mitral valve prolapse Sister   . Hypertension Sister   . HIV Son    History  Substance Use Topics  . Smoking status: Never Smoker   . Smokeless tobacco: Never Used  . Alcohol Use: No   OB History   Grav Para Term Preterm Abortions TAB SAB Ect Mult Living                 Review of Systems  Constitutional: Negative for fever, chills and diaphoresis.  Respiratory: Negative for cough and shortness of breath.   Cardiovascular: Positive for chest pain.  Gastrointestinal: Negative for nausea, vomiting and abdominal pain.  Musculoskeletal: Positive for back pain and neck pain. Negative for neck stiffness.  All other systems reviewed and are negative.      Allergies  Penicillins; Aspirin; Sulfa antibiotics; and  Codeine  Home Medications   Current Outpatient Rx  Name  Route  Sig  Dispense  Refill  . acetaminophen (TYLENOL ARTHRITIS PAIN) 650 MG CR tablet   Oral   Take 1,300 mg by mouth every 8 (eight) hours as needed. For pain         . alendronate (FOSAMAX) 70 MG tablet   Oral   Take 70 mg by mouth once a week. Take with a full glass of water on an empty stomach.         . Ascorbic Acid (VITAMIN C PO)   Oral   Take 1 tablet by mouth daily.         Marland Kitchen aspirin EC 81 MG tablet   Oral   Take 81 mg by mouth daily.         Marland Kitchen atorvastatin (LIPITOR) 40 MG tablet   Oral   Take 20 mg by mouth daily.          . benazepril-hydrochlorthiazide (LOTENSIN HCT) 20-25 MG per tablet   Oral   Take 1 tablet by mouth daily.         . betaxolol (BETOPTIC-S) 0.25 % ophthalmic suspension   Both Eyes   Place 1 drop into both eyes 2 (two) times daily.         Marland Kitchen CALCIUM PO   Oral   Take by mouth daily.         . dorzolamide (TRUSOPT) 2 % ophthalmic solution   Both Eyes   Place 1 drop into both eyes 2 (two) times daily.         Marland Kitchen HYDROcodone-acetaminophen (NORCO/VICODIN) 5-325 MG per tablet   Oral   Take 0.5 tablets by mouth 2 (two) times daily as needed for moderate pain.         Marland Kitchen LORazepam (ATIVAN) 2 MG tablet   Oral   Take 1 mg by mouth at bedtime.         . metFORMIN (GLUMETZA) 500 MG (MOD) 24 hr tablet   Oral   Take 500 mg by mouth at bedtime.          . methocarbamol (ROBAXIN) 500 MG tablet   Oral   Take 1 tablet (500 mg total) by mouth 3 (three) times daily.   90 tablet   1   . Multiple Vitamin (MULTIVITAMIN WITH MINERALS) TABS tablet   Oral   Take 1 tablet by mouth daily.         . ranitidine (ZANTAC) 300 MG capsule   Oral   Take 1 capsule (300 mg total) by mouth every evening.   90 capsule   3   .  travoprost, benzalkonium, (TRAVATAN) 0.004 % ophthalmic solution   Both Eyes   Place 1 drop into both eyes at bedtime.         Marland Kitchen VITAMIN E PO    Oral   Take by mouth daily.         Marland Kitchen oxyCODONE-acetaminophen (PERCOCET/ROXICET) 5-325 MG per tablet      Take 1-2 pills every 4-6 hours as needed for pain.   10 tablet   0    BP 132/66  Pulse 68  Temp(Src) 98.1 F (36.7 C) (Oral)  Resp 18  SpO2 98% Physical Exam  Nursing note and vitals reviewed. Constitutional: She appears well-developed and well-nourished. No distress.  Pt lying comfortably in exam bed, NAD.   HENT:  Head: Normocephalic and atraumatic.  Eyes: Conjunctivae are normal. No scleral icterus.  Neck: Normal range of motion.  Cardiovascular: Normal rate, regular rhythm and normal heart sounds.   Pulmonary/Chest: Effort normal and breath sounds normal. No respiratory distress. She has no wheezes. She has no rales. She exhibits no tenderness.  Abdominal: Soft. Bowel sounds are normal. She exhibits no distension and no mass. There is no tenderness. There is no rebound and no guarding.  Musculoskeletal: Normal range of motion.  Neurological: She is alert.  Skin: Skin is warm and dry. She is not diaphoretic.    ED Course  Procedures (including critical care time) Labs Review Labs Reviewed  CBC - Abnormal; Notable for the following:    WBC 3.9 (*)    All other components within normal limits  BASIC METABOLIC PANEL - Abnormal; Notable for the following:    Glucose, Bld 108 (*)    GFR calc non Af Amer 86 (*)    All other components within normal limits  TROPONIN I  POCT I-STAT TROPONIN I   Imaging Review Dg Chest 2 View (if Patient Has Fever And/or Copd)  04/26/2013   CLINICAL DATA:  Chest pain.  EXAM: CHEST  2 VIEW  COMPARISON:  PA and lateral chest 06/25/2011.  FINDINGS: Lungs are clear. Heart size is normal. No pneumothorax or pleural effusion. History of thoracolumbar scoliosis is again seen. The patient has undergone anterior cervical fusion since the prior exam.  IMPRESSION: No acute disease.   Electronically Signed   By: Drusilla Kanner M.D.   On:  04/26/2013 19:16    EKG Interpretation    Date/Time:  Monday April 26 2013 17:33:13 EST Ventricular Rate:  76 PR Interval:  164 QRS Duration: 70 QT Interval:  377 QTC Calculation: 424 R Axis:   51 Text Interpretation:  Sinus rhythm Left atrial enlargement non-spec ST abnomality, no significant change since last tracing Confirmed by HARRISON  MD, FORREST ([id number]) on 04/26/2013 8:47:22 PM            MDM   Final diagnoses:  1. Chest Pain    8:42 PM Pt declined pain medication at this time. States pain is only real bad if she moves and she is trying not to  Discussed pt with Dr. Romeo Apple, istat troponin was negative so regular troponin was ordered.    Troponin: negative.  EKG: unremarkable, consistent with previous CXR: unremarkable  CBC and BMP: unremarkable   Pt is low risk for marjor cardiac event based off HEART score. Will discharge home to f/u with PCP.  All labs/imaging/findings discussed with patient. All questions answered and concerns addressed. Will discharge pt home and have pt f/u with her PCP. Return precautions given. Pt verbalized understanding and agreement  with tx plan. Vitals: unremarkable. Discharged in stable condition.    Discussed pt with attending during ED encounter and agrees with plan.      Noland Fordyce, PA-C 04/27/13 [phone]

## 2013-04-28 NOTE — ED Provider Notes (Signed)
Medical screening examination/treatment/procedure(s) were conducted as a shared visit with non-physician practitioner(s) and myself.  I personally evaluated the patient during the encounter.  EKG Interpretation    Date/Time:  Monday April 26 2013 17:33:13 EST Ventricular Rate:  76 PR Interval:  164 QRS Duration: 70 QT Interval:  377 QTC Calculation: 424 R Axis:   51 Text Interpretation:  Sinus rhythm Left atrial enlargement non-spec ST abnomality, no significant change since last tracing Confirmed by Shawan Corella  MD, [place] ([id number]) on 04/26/2013 8:47:22 PM            I interviewed and examined the patient. Lungs are CTAB. Cardiac exam wnl. Abdomen soft.  Pain atypical for cardiac. Pt has multiple msk and spinal issues. Few RF's for heart dis. My suspicion is her sx are msk and related to her ongoing cervical vs shoulder issues. She appears well on exam. Delta trop neg. Doubt ACS/USA. Will rec close f/u w/ pcp. Return precautions provided.   Blanchard Kelch, MD 04/28/13 [phone]

## 2013-04-29 ENCOUNTER — Ambulatory Visit (INDEPENDENT_AMBULATORY_CARE_PROVIDER_SITE_OTHER): Payer: 59 | Admitting: Internal Medicine

## 2013-04-29 ENCOUNTER — Encounter: Payer: Self-pay | Admitting: Internal Medicine

## 2013-04-29 VITALS — BP 144/82 | HR 72 | Temp 97.7°F | Resp 16 | Wt 157.6 lb

## 2013-04-29 DIAGNOSIS — M940 Chondrocostal junction syndrome [Tietze]: Secondary | ICD-10-CM

## 2013-04-29 DIAGNOSIS — K219 Gastro-esophageal reflux disease without esophagitis: Secondary | ICD-10-CM

## 2013-04-29 DIAGNOSIS — E119 Type 2 diabetes mellitus without complications: Secondary | ICD-10-CM

## 2013-04-29 DIAGNOSIS — IMO0002 Reserved for concepts with insufficient information to code with codable children: Secondary | ICD-10-CM

## 2013-04-29 DIAGNOSIS — I10 Essential (primary) hypertension: Secondary | ICD-10-CM

## 2013-04-29 DIAGNOSIS — E7800 Pure hypercholesterolemia, unspecified: Secondary | ICD-10-CM

## 2013-04-29 MED ORDER — PREDNISONE 20 MG PO TABS: ORAL_TABLET | ORAL | Status: DC

## 2013-04-29 NOTE — Progress Notes (Signed)
Subjective:    Patient ID: Sara [name], female    DOB: 02/21/1946, 68 y.o.   MRN: [id number]  Chest Pain  This is a recurrent problem. The current episode started in the past 7 days. The onset quality is sudden. The problem occurs 2 to 4 times per day. The problem has been waxing and waning. Pain location: Bilat anterior chest exascerbated by positional changes. The pain is mild. The quality of the pain is described as sharp and stabbing. The pain does not radiate. Associated with: positional changes  She has tried NSAIDs (ice packs seem to help) for the symptoms. The treatment provided mild relief.  Her past medical history is significant for anxiety/panic attacks.    Patient was seen at [facility] ER 3 days ago and had negative CXR and cardiac w/u and was released .   Medication List       This list is accurate as of: 04/29/13  8:37 PM.  Always use your most recent med list.               alendronate 70 MG tablet  Commonly known as:  FOSAMAX  Take 70 mg by mouth once a week. Take with a full glass of water on an empty stomach.     aspirin EC 81 MG tablet  Take 81 mg by mouth daily.     atorvastatin 40 MG tablet  Commonly known as:  LIPITOR  Take 20 mg by mouth daily.     benazepril-hydrochlorthiazide 20-25 MG per tablet  Commonly known as:  LOTENSIN HCT  Take 1 tablet by mouth daily.     betaxolol 0.25 % ophthalmic suspension  Commonly known as:  BETOPTIC-S  Place 1 drop into both eyes 2 (two) times daily.     CALCIUM PO  Take by mouth daily.     dorzolamide 2 % ophthalmic solution  Commonly known as:  TRUSOPT  Place 1 drop into both eyes 2 (two) times daily.     HYDROcodone-acetaminophen 5-325 MG per tablet  Commonly known as:  NORCO/VICODIN  Take 0.5 tablets by mouth 2 (two) times daily as needed for moderate pain.     LORazepam 2 MG tablet  Commonly known as:  ATIVAN  Take 1 mg by mouth at bedtime.     metFORMIN 500 MG (MOD) 24 hr tablet  Commonly known as:   GLUMETZA  Take 500 mg by mouth at bedtime.     methocarbamol 500 MG tablet  Commonly known as:  ROBAXIN  Take 1 tablet (500 mg total) by mouth 3 (three) times daily.     multivitamin with minerals Tabs tablet  Take 1 tablet by mouth daily.     predniSONE 20 MG tablet  Commonly known as:  DELTASONE  Take 3 tablets immediately , then take 1 tab 3 x day for 3 days, then 1 tab 2 x day for 3 days, then 1 tab 1 x day for 5 days     ranitidine 300 MG capsule  Commonly known as:  ZANTAC  Take 1 capsule (300 mg total) by mouth every evening.     travoprost (benzalkonium) 0.004 % ophthalmic solution  Commonly known as:  TRAVATAN  Place 1 drop into both eyes at bedtime.     TYLENOL ARTHRITIS PAIN 650 MG CR tablet  Generic drug:  acetaminophen  Take 1,300 mg by mouth every 8 (eight) hours as needed. For pain     VITAMIN C PO  Take 1 tablet  by mouth daily.     VITAMIN E PO  Take by mouth daily.       Allergies  Allergen Reactions  . Penicillins Shortness Of Breath    And hives  . Aspirin Other (See Comments)    Stomach upset, but low dose is ok  . Sulfa Antibiotics Other (See Comments)    unknown  . Codeine Rash   Past Medical History  Diagnosis Date  . Sarcoidosis >28yrs ago    no problems since  . DeQuervain's disease (tenosynovitis)   . Diverticulitis   . Bronchitis     hx of;>70yrs ago  . Headache(784.0)     related neck issues  . Dizziness     related to neck issues  . Chronic neck pain     arthritis  . History of shingles 2012  . Insomnia     d/t pain and takes Ativan nightly  . GERD (gastroesophageal reflux disease)     takes Ranitidine nightly  . Glaucoma   . High cholesterol     takes Lipitor daily  . Hypertension     takes Lotensin HCT daily  . COPD (chronic obstructive pulmonary disease)   . Asthma   . Degenerative disc disease     neck  . Frozen shoulder September 2012  . Hay fever   . Type II or unspecified type diabetes mellitus without  mention of complication, not stated as uncontrolled     takes Metformin nightly  . Osteoporosis    Review of Systems  Cardiovascular: Positive for chest pain.  otherwise 12 pt review is negative   Objective:   Physical Exam  Constitutional: She is oriented to person, place, and time. She appears well-developed and well-nourished.  HENT:  Head: Normocephalic and atraumatic.  Eyes: Conjunctivae and EOM are normal. Pupils are equal, round, and reactive to light.  Neck: Normal range of motion. Neck supple. No JVD present. No thyromegaly present.  Cardiovascular: Normal rate, regular rhythm and normal heart sounds.  Exam reveals no gallop.   No murmur heard. Pulmonary/Chest: Effort normal. She has no wheezes. She has no rales. She exhibits tenderness.  Exquisite tenderness of Lt . Rt CC jt's emulating her CP.  Abdominal: Soft. Bowel sounds are normal. There is no tenderness.  Lymphadenopathy:    She has no cervical adenopathy.  Neurological: She is alert and oriented to person, place, and time. She has normal reflexes. No cranial nerve deficit.  Skin: Skin is warm and dry. No rash noted. No erythema. No pallor.   Assessment & Plan:   1. Tietze's disease  Rx Prednisone pulse/taper  Reassurance

## 2013-04-29 NOTE — Patient Instructions (Signed)

## 2013-05-03 ENCOUNTER — Ambulatory Visit: Payer: PRIVATE HEALTH INSURANCE | Admitting: Physical Therapy

## 2013-05-04 ENCOUNTER — Ambulatory Visit: Payer: Self-pay | Admitting: Internal Medicine

## 2013-05-14 ENCOUNTER — Ambulatory Visit: Payer: Self-pay | Admitting: Internal Medicine

## 2013-05-21 ENCOUNTER — Ambulatory Visit: Payer: Self-pay | Admitting: Internal Medicine

## 2013-06-04 ENCOUNTER — Encounter: Payer: Self-pay | Admitting: Internal Medicine

## 2013-06-04 ENCOUNTER — Ambulatory Visit (INDEPENDENT_AMBULATORY_CARE_PROVIDER_SITE_OTHER): Payer: 59 | Admitting: Emergency Medicine

## 2013-06-04 VITALS — BP 106/66 | HR 68 | Temp 97.5°F | Resp 16 | Ht 66.0 in | Wt 155.2 lb

## 2013-06-04 DIAGNOSIS — I10 Essential (primary) hypertension: Secondary | ICD-10-CM

## 2013-06-04 DIAGNOSIS — E559 Vitamin D deficiency, unspecified: Secondary | ICD-10-CM | POA: Insufficient documentation

## 2013-06-04 DIAGNOSIS — E7800 Pure hypercholesterolemia, unspecified: Secondary | ICD-10-CM

## 2013-06-04 DIAGNOSIS — E119 Type 2 diabetes mellitus without complications: Secondary | ICD-10-CM

## 2013-06-04 DIAGNOSIS — Z79899 Other long term (current) drug therapy: Secondary | ICD-10-CM | POA: Insufficient documentation

## 2013-06-04 LAB — CBC WITH DIFFERENTIAL/PLATELET
BASOS ABS: 0 10*3/uL (ref 0.0–0.1)
BASOS PCT: 0 % (ref 0–1)
EOS ABS: 0 10*3/uL (ref 0.0–0.7)
Eosinophils Relative: 1 % (ref 0–5)
HCT: 38.4 % (ref 36.0–46.0)
HEMOGLOBIN: 12.7 g/dL (ref 12.0–15.0)
Lymphocytes Relative: 35 % (ref 12–46)
Lymphs Abs: 1.4 10*3/uL (ref 0.7–4.0)
MCH: 29.2 pg (ref 26.0–34.0)
MCHC: 33.1 g/dL (ref 30.0–36.0)
MCV: 88.3 fL (ref 78.0–100.0)
MONO ABS: 0.3 10*3/uL (ref 0.1–1.0)
MONOS PCT: 8 % (ref 3–12)
Neutro Abs: 2.2 10*3/uL (ref 1.7–7.7)
Neutrophils Relative %: 56 % (ref 43–77)
Platelets: 313 10*3/uL (ref 150–400)
RBC: 4.35 MIL/uL (ref 3.87–5.11)
RDW: 13.9 % (ref 11.5–15.5)
WBC: 3.9 10*3/uL — ABNORMAL LOW (ref 4.0–10.5)

## 2013-06-04 LAB — HEMOGLOBIN A1C
HEMOGLOBIN A1C: 6.2 % — AB (ref <5.7)
Mean Plasma Glucose: 131 mg/dL — ABNORMAL HIGH (ref <117)

## 2013-06-04 MED ORDER — MELOXICAM 15 MG PO TABS: 15.0000 mg | ORAL_TABLET | Freq: Every day | ORAL | Status: DC

## 2013-06-04 NOTE — Progress Notes (Signed)
Patient ID: Sara [name], female   DOB: [birth year], 68 y.o.   MRN: [id number] She has been recently treated for costochondritis with improvement but still occasionally gets sore and feels swollen around left chest/ breast area. She notes mammogram up to date and WNL   This very nice 68 y.o. SBF presents for 3 month follow up with Hypertension, Hyperlipidemia, Pre-Diabetes and Vitamin D Deficiency.    HTN predates since   . BP has been controlled at home. Today's BP: 106/66 mmHg . Patient denies any cardiac type chest pain, palpitations, dyspnea/orthopnea/PND, dizziness, claudication, or dependent edema.   Hyperlipidemia is controlled with diet & meds. Last Cholesterol was 150 , Triglycerides were 55, HDL 71 and LDL 68   . Patient denies myalgias or other med SE's.    Also, the patient has history of T2 NIDDM with last A1c of  6.4   . Patient denies any symptoms of reactive hypoglycemia, diabetic polys, paresthesias or visual blurring.   Further, Patient has history of Vitamin D Deficiency with last vitamin D of 59. Patient supplements vitamin D without any suspected side-effects.  Current Outpatient Prescriptions on File Prior to Visit  Medication Sig Dispense Refill  . acetaminophen (TYLENOL ARTHRITIS PAIN) 650 MG CR tablet Take 1,300 mg by mouth every 8 (eight) hours as needed. For pain      . alendronate (FOSAMAX) 70 MG tablet Take 70 mg by mouth once a week. Take with a full glass of water on an empty stomach.      . Ascorbic Acid (VITAMIN C PO) Take 1 tablet by mouth daily.      Marland Kitchen aspirin EC 81 MG tablet Take 81 mg by mouth daily.      Marland Kitchen atorvastatin (LIPITOR) 40 MG tablet Take 20 mg by mouth daily.       . benazepril-hydrochlorthiazide (LOTENSIN HCT) 20-25 MG per tablet Take 1 tablet by mouth daily.      . betaxolol (BETOPTIC-S) 0.25 % ophthalmic suspension Place 1 drop into both eyes 2 (two) times daily.      Marland Kitchen CALCIUM PO Take 500 mg by mouth daily.       . dorzolamide (TRUSOPT) 2 %  ophthalmic solution Place 1 drop into both eyes 2 (two) times daily.      Marland Kitchen HYDROcodone-acetaminophen (NORCO/VICODIN) 5-325 MG per tablet Take 0.5 tablets by mouth 2 (two) times daily as needed for moderate pain.      Marland Kitchen LORazepam (ATIVAN) 2 MG tablet Take 1 mg by mouth at bedtime.      . metFORMIN (GLUMETZA) 500 MG (MOD) 24 hr tablet Take 500 mg by mouth at bedtime.       . methocarbamol (ROBAXIN) 500 MG tablet Take 1 tablet (500 mg total) by mouth 3 (three) times daily.  90 tablet  1  . Multiple Vitamin (MULTIVITAMIN WITH MINERALS) TABS tablet Take 1 tablet by mouth daily.      . ranitidine (ZANTAC) 300 MG capsule Take 1 capsule (300 mg total) by mouth every evening.  90 capsule  3  . travoprost, benzalkonium, (TRAVATAN) 0.004 % ophthalmic solution Place 1 drop into both eyes at bedtime.      Marland Kitchen VITAMIN E PO Take by mouth daily.       No current facility-administered medications on file prior to visit.     Allergies  Allergen Reactions  . Penicillins Shortness Of Breath    And hives  . Aspirin Other (See Comments)    Stomach upset, but  low dose is ok  . Sulfa Antibiotics Other (See Comments)    unknown  . Codeine Rash    PMHx:   Past Medical History  Diagnosis Date  . Sarcoidosis >46yrs ago    no problems since  . DeQuervain's disease (tenosynovitis)   . Diverticulitis   . Bronchitis     hx of;>84yrs ago  . Headache(784.0)     related neck issues  . Dizziness     related to neck issues  . Chronic neck pain     arthritis  . History of shingles 2012  . Insomnia     d/t pain and takes Ativan nightly  . GERD (gastroesophageal reflux disease)     takes Ranitidine nightly  . Glaucoma   . High cholesterol     takes Lipitor daily  . Hypertension     takes Lotensin HCT daily  . COPD (chronic obstructive pulmonary disease)   . Asthma   . Degenerative disc disease     neck  . Frozen shoulder September 2012  . Hay fever   . Type II or unspecified type diabetes mellitus  without mention of complication, not stated as uncontrolled     takes Metformin nightly  . Osteoporosis     FHx:    Reviewed / unchanged  SHx:    Reviewed / unchanged   Systems Review: Constitutional: Denies fever, chills, wt changes, headaches, insomnia, fatigue, night sweats, change in appetite. Eyes: Denies redness, blurred vision, diplopia, discharge, itchy, watery eyes.  ENT: Denies discharge, congestion, post nasal drip, epistaxis, sore throat, earache, hearing loss, dental pain, tinnitus, vertigo, sinus pain, snoring.  CV: Denies chest pain, palpitations, irregular heartbeat, syncope, dyspnea, diaphoresis, orthopnea, PND, claudication, edema. Respiratory: denies cough, dyspnea, DOE, pleurisy, hoarseness, laryngitis, wheezing. +LUMP Chest wall Gastrointestinal: Denies dysphagia, odynophagia, heartburn, reflux, water brash, abdominal pain or cramps, nausea, vomiting, bloating, diarrhea, constipation, hematemesis, melena, hematochezia,  or hemorrhoids. Genitourinary: Denies dysuria, frequency, urgency, nocturia, hesitancy, discharge, hematuria, flank pain. Musculoskeletal: Denies arthralgias, myalgias, stiffness, jt. swelling, pain, limp, strain/sprain.  Skin: Denies pruritus, rash, hives, warts, acne, eczema, change in skin lesion(s). Neuro: No weakness, tremor, incoordination, spasms, paresthesia, or pain. Psychiatric: Denies confusion, memory loss, or sensory loss. Endo: Denies change in weight, skin, hair change.  Heme/Lymph: No excessive bleeding, bruising, orenlarged lymph nodes.   Exam:  BP 106/66  Pulse 68  Temp(Src) 97.5 F (36.4 C) (Temporal)  Resp 16  Ht 5\' 6"  (1.676 m)  Wt 155 lb 3.2 oz (70.398 kg)  BMI 25.06 kg/m2  Appears well nourished - in no distress. Eyes: PERRLA, EOMs, conjunctiva no swelling or erythema. Sinuses: No frontal/maxillary tenderness ENT/Mouth: EAC's clear, TM's nl w/o erythema, bulging. Nares clear w/o erythema, swelling, exudates. Oropharynx  clear without erythema or exudates. Oral hygiene is good. Tongue normal, non obstructing. Hearing intact.  Neck: Supple. Thyroid nl. Car 2+/2+ without bruits, nodes or JVD. Chest: Respirations nl with BS clear & equal w/o rales, rhonchi, wheezing or stridor.  OBVIOUS FULLNESS LEFT MID CHEST/ MEDIASTINAL AREA Cor: Heart sounds normal w/ regular rate and rhythm without sig. murmurs, gallops, clicks, or rubs. Peripheral pulses normal and equal  without edema.  Abdomen: Soft & bowel sounds normal. Non-tender w/o guarding, rebound, hernias, masses, or organomegaly.  Lymphatics: Unremarkable.  Musculoskeletal: Full ROM all peripheral extremities, joint stability, 5/5 strength, and normal gait.  Skin: Warm, dry without exposed rashes, lesions, ecchymosis apparent. DRY heels Neuro: Cranial nerves intact, reflexes equal bilaterally. Sensory-motor testing grossly intact. Tendon  reflexes grossly intact.  Pysch: Alert & oriented x 3. Insight and judgement nl & appropriate. No ideations.  Assessment and Plan:  1. Hypertension - Continue monitor blood pressure at home. Continue diet/meds same.  2. Hyperlipidemia - Continue diet/meds, exercise,& lifestyle modifications. Continue monitor periodic cholesterol/liver & renal functions   3. T2 NIDDM - continue recommend prudent low glycemic diet, weight control, regular exercise, diabetic monitoring and periodic eye exams. Callus/ dry skin DM- Advised needs podiatry evaluation with  DM Hx if no change, epsom salt soaks, vaseline treatment QD, monitor QD and call with any concerns/ adverse changes   4. Vitamin D Deficiency - Continue supplementation.  5. Costochondritis- Trial of Meloxicam, if no change with pain/ swelling need ct chest to evaluate, Patient will call with results  Recommended regular exercise, BP monitoring, weight control, and discussed med and SE's. Recommended labs to assess and monitor clinical status. Further disposition pending results of  labs.

## 2013-06-04 NOTE — Patient Instructions (Signed)

## 2013-06-05 LAB — BASIC METABOLIC PANEL WITH GFR
BUN: 16 mg/dL (ref 6–23)
CALCIUM: 10.1 mg/dL (ref 8.4–10.5)
CO2: 31 mEq/L (ref 19–32)
CREATININE: 0.91 mg/dL (ref 0.50–1.10)
Chloride: 100 mEq/L (ref 96–112)
GFR, EST AFRICAN AMERICAN: 77 mL/min
GFR, Est Non African American: 66 mL/min
GLUCOSE: 99 mg/dL (ref 70–99)
Potassium: 4.5 mEq/L (ref 3.5–5.3)
Sodium: 138 mEq/L (ref 135–145)

## 2013-06-05 LAB — VITAMIN D 25 HYDROXY (VIT D DEFICIENCY, FRACTURES): VIT D 25 HYDROXY: 67 ng/mL (ref 30–89)

## 2013-06-05 LAB — HEPATIC FUNCTION PANEL
ALBUMIN: 4.4 g/dL (ref 3.5–5.2)
ALT: 13 U/L (ref 0–35)
AST: 17 U/L (ref 0–37)
Alkaline Phosphatase: 35 U/L — ABNORMAL LOW (ref 39–117)
Bilirubin, Direct: 0.1 mg/dL (ref 0.0–0.3)
Indirect Bilirubin: 0.3 mg/dL (ref 0.2–1.2)
Total Bilirubin: 0.4 mg/dL (ref 0.2–1.2)
Total Protein: 6.6 g/dL (ref 6.0–8.3)

## 2013-06-05 LAB — INSULIN, FASTING: Insulin fasting, serum: 13 u[IU]/mL (ref 3–28)

## 2013-06-05 LAB — LIPID PANEL
CHOLESTEROL: 151 mg/dL (ref 0–200)
HDL: 61 mg/dL (ref >39)
LDL Cholesterol: 77 mg/dL (ref 0–99)
Total CHOL/HDL Ratio: 2.5 Ratio
Triglycerides: 63 mg/dL (ref <150)
VLDL: 13 mg/dL (ref 0–40)

## 2013-06-05 LAB — MAGNESIUM: MAGNESIUM: 2.1 mg/dL (ref 1.5–2.5)

## 2013-06-05 LAB — TSH: TSH: 0.886 u[IU]/mL (ref 0.350–4.500)

## 2013-06-23 ENCOUNTER — Other Ambulatory Visit: Payer: Self-pay | Admitting: Internal Medicine

## 2013-06-24 ENCOUNTER — Other Ambulatory Visit: Payer: Self-pay

## 2013-06-30 ENCOUNTER — Telehealth: Payer: Self-pay | Admitting: *Deleted

## 2013-06-30 NOTE — Telephone Encounter (Signed)
Patient aware of labs.  

## 2013-07-01 ENCOUNTER — Other Ambulatory Visit: Payer: Self-pay | Admitting: Emergency Medicine

## 2013-07-05 DIAGNOSIS — H401133 Primary open-angle glaucoma, bilateral, severe stage: Secondary | ICD-10-CM | POA: Insufficient documentation

## 2013-07-06 DIAGNOSIS — IMO0002 Reserved for concepts with insufficient information to code with codable children: Secondary | ICD-10-CM | POA: Insufficient documentation

## 2013-08-02 ENCOUNTER — Other Ambulatory Visit: Payer: Self-pay | Admitting: Internal Medicine

## 2013-09-16 ENCOUNTER — Ambulatory Visit: Payer: Self-pay | Admitting: Emergency Medicine

## 2013-09-24 ENCOUNTER — Ambulatory Visit: Payer: Self-pay | Admitting: Emergency Medicine

## 2013-10-18 ENCOUNTER — Other Ambulatory Visit: Payer: Self-pay | Admitting: *Deleted

## 2013-10-18 MED ORDER — LORAZEPAM 2 MG PO TABS: ORAL_TABLET | ORAL | Status: DC

## 2013-10-19 ENCOUNTER — Other Ambulatory Visit: Payer: Self-pay | Admitting: *Deleted

## 2013-10-19 ENCOUNTER — Other Ambulatory Visit: Payer: Self-pay | Admitting: Internal Medicine

## 2013-10-19 MED ORDER — ATORVASTATIN CALCIUM 40 MG PO TABS: 40.0000 mg | ORAL_TABLET | Freq: Every day | ORAL | Status: DC

## 2013-10-19 MED ORDER — METFORMIN HCL ER (MOD) 500 MG PO TB24: 500.0000 mg | ORAL_TABLET | Freq: Every day | ORAL | Status: DC

## 2013-10-19 MED ORDER — ALENDRONATE SODIUM 70 MG PO TABS: 70.0000 mg | ORAL_TABLET | ORAL | Status: DC

## 2013-10-19 MED ORDER — BENAZEPRIL-HYDROCHLOROTHIAZIDE 20-25 MG PO TABS: 1.0000 | ORAL_TABLET | Freq: Every day | ORAL | Status: DC

## 2013-10-19 MED ORDER — RANITIDINE HCL 300 MG PO CAPS: 300.0000 mg | ORAL_CAPSULE | Freq: Every evening | ORAL | Status: DC

## 2013-10-25 ENCOUNTER — Other Ambulatory Visit: Payer: Self-pay | Admitting: *Deleted

## 2013-10-25 MED ORDER — METFORMIN HCL ER 500 MG PO TB24: 500.0000 mg | ORAL_TABLET | Freq: Every day | ORAL | Status: DC

## 2013-11-16 ENCOUNTER — Encounter (INDEPENDENT_AMBULATORY_CARE_PROVIDER_SITE_OTHER): Payer: Self-pay

## 2013-11-16 ENCOUNTER — Ambulatory Visit (INDEPENDENT_AMBULATORY_CARE_PROVIDER_SITE_OTHER): Payer: Commercial Managed Care - HMO | Admitting: Physician Assistant

## 2013-11-16 ENCOUNTER — Encounter: Payer: Self-pay | Admitting: Physician Assistant

## 2013-11-16 VITALS — BP 110/68 | HR 72 | Temp 97.7°F | Resp 16 | Ht 65.0 in | Wt 148.0 lb

## 2013-11-16 DIAGNOSIS — Z1331 Encounter for screening for depression: Secondary | ICD-10-CM

## 2013-11-16 DIAGNOSIS — E1122 Type 2 diabetes mellitus with diabetic chronic kidney disease: Secondary | ICD-10-CM | POA: Insufficient documentation

## 2013-11-16 DIAGNOSIS — Z23 Encounter for immunization: Secondary | ICD-10-CM

## 2013-11-16 DIAGNOSIS — K2100 Gastro-esophageal reflux disease with esophagitis, without bleeding: Secondary | ICD-10-CM

## 2013-11-16 DIAGNOSIS — E1129 Type 2 diabetes mellitus with other diabetic kidney complication: Secondary | ICD-10-CM

## 2013-11-16 DIAGNOSIS — E7800 Pure hypercholesterolemia, unspecified: Secondary | ICD-10-CM

## 2013-11-16 DIAGNOSIS — J45909 Unspecified asthma, uncomplicated: Secondary | ICD-10-CM

## 2013-11-16 DIAGNOSIS — J449 Chronic obstructive pulmonary disease, unspecified: Secondary | ICD-10-CM

## 2013-11-16 DIAGNOSIS — Z79899 Other long term (current) drug therapy: Secondary | ICD-10-CM

## 2013-11-16 DIAGNOSIS — E559 Vitamin D deficiency, unspecified: Secondary | ICD-10-CM

## 2013-11-16 DIAGNOSIS — N182 Chronic kidney disease, stage 2 (mild): Secondary | ICD-10-CM

## 2013-11-16 DIAGNOSIS — H409 Unspecified glaucoma: Secondary | ICD-10-CM

## 2013-11-16 DIAGNOSIS — I10 Essential (primary) hypertension: Secondary | ICD-10-CM

## 2013-11-16 DIAGNOSIS — Z789 Other specified health status: Secondary | ICD-10-CM

## 2013-11-16 DIAGNOSIS — M5137 Other intervertebral disc degeneration, lumbosacral region: Secondary | ICD-10-CM

## 2013-11-16 DIAGNOSIS — Z0000 Encounter for general adult medical examination without abnormal findings: Secondary | ICD-10-CM

## 2013-11-16 LAB — CBC WITH DIFFERENTIAL/PLATELET
Basophils Absolute: 0 10*3/uL (ref 0.0–0.1)
Basophils Relative: 0 % (ref 0–1)
Eosinophils Absolute: 0.1 10*3/uL (ref 0.0–0.7)
Eosinophils Relative: 2 % (ref 0–5)
HEMATOCRIT: 38.3 % (ref 36.0–46.0)
HEMOGLOBIN: 12.9 g/dL (ref 12.0–15.0)
LYMPHS ABS: 2.2 10*3/uL (ref 0.7–4.0)
Lymphocytes Relative: 43 % (ref 12–46)
MCH: 29 pg (ref 26.0–34.0)
MCHC: 33.7 g/dL (ref 30.0–36.0)
MCV: 86.1 fL (ref 78.0–100.0)
MONO ABS: 0.4 10*3/uL (ref 0.1–1.0)
MONOS PCT: 8 % (ref 3–12)
NEUTROS ABS: 2.4 10*3/uL (ref 1.7–7.7)
NEUTROS PCT: 47 % (ref 43–77)
Platelets: 276 10*3/uL (ref 150–400)
RBC: 4.45 MIL/uL (ref 3.87–5.11)
RDW: 14.2 % (ref 11.5–15.5)
WBC: 5 10*3/uL (ref 4.0–10.5)

## 2013-11-16 LAB — LIPID PANEL
Cholesterol: 151 mg/dL (ref 0–200)
HDL: 55 mg/dL (ref >39)
LDL CALC: 82 mg/dL (ref 0–99)
TRIGLYCERIDES: 69 mg/dL (ref <150)
Total CHOL/HDL Ratio: 2.7 Ratio
VLDL: 14 mg/dL (ref 0–40)

## 2013-11-16 LAB — BASIC METABOLIC PANEL WITH GFR
BUN: 13 mg/dL (ref 6–23)
CO2: 26 mEq/L (ref 19–32)
Calcium: 9 mg/dL (ref 8.4–10.5)
Chloride: 106 mEq/L (ref 96–112)
Creat: 0.87 mg/dL (ref 0.50–1.10)
GFR, EST AFRICAN AMERICAN: 81 mL/min
GFR, Est Non African American: 70 mL/min
GLUCOSE: 107 mg/dL — AB (ref 70–99)
POTASSIUM: 4.5 meq/L (ref 3.5–5.3)
Sodium: 137 mEq/L (ref 135–145)

## 2013-11-16 LAB — HEPATIC FUNCTION PANEL
ALK PHOS: 35 U/L — AB (ref 39–117)
ALT: 14 U/L (ref 0–35)
AST: 15 U/L (ref 0–37)
Albumin: 4.1 g/dL (ref 3.5–5.2)
BILIRUBIN DIRECT: 0.1 mg/dL (ref 0.0–0.3)
Indirect Bilirubin: 0.4 mg/dL (ref 0.2–1.2)
TOTAL PROTEIN: 6.3 g/dL (ref 6.0–8.3)
Total Bilirubin: 0.5 mg/dL (ref 0.2–1.2)

## 2013-11-16 LAB — MAGNESIUM: MAGNESIUM: 2.4 mg/dL (ref 1.5–2.5)

## 2013-11-16 LAB — TSH: TSH: 1.175 u[IU]/mL (ref 0.350–4.500)

## 2013-11-16 LAB — HEMOGLOBIN A1C
Hgb A1c MFr Bld: 6.6 % — ABNORMAL HIGH (ref <5.7)
Mean Plasma Glucose: 143 mg/dL — ABNORMAL HIGH (ref <117)

## 2013-11-16 NOTE — Progress Notes (Signed)
MEDICARE ANNUAL WELLNESS VISIT AND CPE  Assessment:   1. Essential hypertension - CBC with Differential - BASIC METABOLIC PANEL WITH GFR - Hepatic function panel - TSH  2. Unspecified asthma(493.90) controlled  3. Gastroesophageal reflux disease with esophagitis Controlled, off NSAIDS  4. Degeneration of intervertebral disc of lumbosacral region Continue follow up with Dr. Mina Marble  5. Encounter for long-term (current) use of other medications - Magnesium  6. Glaucoma Continue follow ups  7. Hyperlipidemia - Lipid panel  8. Vitamin D Deficiency - Vit D  25 hydroxy (rtn osteoporosis monitoring)  9. Chronic obstructive pulmonary disease, unspecified COPD, unspecified chronic bronchitis type Check CXR  10. CKD (chronic kidney disease) stage 2, GFR 60-89 ml/min - BASIC METABOLIC PANEL WITH GFR  11. Type II or unspecified type diabetes mellitus with renal manifestations, not stated as uncontrolled Discussed general issues about diabetes pathophysiology and management., Educational material distributed., Suggested low cholesterol diet., Encouraged aerobic exercise., Discussed foot care., Reminded to get yearly retinal exam. - Hemoglobin A1c - HM DIABETES FOOT EXAM   Plan:   During the course of the visit the patient was educated and counseled about appropriate screening and preventive services including:    Pneumococcal vaccine   Influenza vaccine  Td vaccine  Screening electrocardiogram  Screening mammography  Bone densitometry screening  Colorectal cancer screening  Diabetes screening  Glaucoma screening  Nutrition counseling   Advanced directives: given information/requested  Screening recommendations, referrals:  Vaccinations: Tdap vaccine not indicated Influenza vaccine declined Pneumococcal vaccine prevnar first and then penumovax in 1 year Shingles vaccine not indicated Hep B vaccine not indicated  Nutrition assessed and recommended   Colonoscopy 2019 Mammogram ordered Pap smear not indicated Pelvic exam not indicated Recommended yearly ophthalmology/optometry visit for glaucoma screening and checkup Recommended yearly dental visit for hygiene and checkup Advanced directives - requested  Conditions/risks identified: BMI: Discussed weight loss, diet, and increase physical activity.  Increase physical activity: AHA recommends 150 minutes of physical activity a week.  Medications reviewed DEXA- due 07/2014 Diabetes at goal, ACE/ARB therapy Yes. Urinary Incontinence is not an issue: discussed non pharmacology and pharmacology options.  Fall risk: low- discussed PT, home fall assessment, medications.   Subjective:   Sara [name] is a 68 y.o. female who presents for Welcome to Medicare visit.   Her blood pressure has been controlled at home, today their BP is BP: 110/68 mmHg She does workout. She denies chest pain, shortness of breath, dizziness.  She is on cholesterol medication and denies myalgias. Her cholesterol is at goal. The cholesterol last visit was:   Lab Results  Component Value Date   CHOL 151 06/04/2013   HDL 61 06/04/2013   LDLCALC 77 06/04/2013   TRIG 63 06/04/2013   CHOLHDL 2.5 06/04/2013    She has been working on diet and exercise for diabetes, she has CKD stage II due to DM, and denies paresthesia of the feet, polydipsia and polyuria. Last A1C in the office was:  Lab Results  Component Value Date   HGBA1C 6.2* 06/04/2013   Patient is on Vitamin D supplement.   Lab Results  Component Value Date   VD25OH 67 06/04/2013     See Dr. Mina Marble for worker's compensation for lower back pain, walks with cane, had ESI injections last Thursday at [facility].  She has osteoporosis, t -2.9, and has been on fosamax since 07/2012, Vitamin D and calcium.   Names of Other Physician/Practitioners you currently use: 1. [facility]- here for primary care 2. [facility], eye  doctor, last visit 02/2013 3. Dr. Dionne Ano at [facility] for [facility]- has next appointment on 09/15, has been having very close follow ups q 2-3 months 3. Unknown with new insurance, was Dr. Georgie Chard, dentist, last visit 01/2013 Patient Care Team: Unk Pinto, MD as PCP - General (Internal Medicine) Normajean Glasgow, MD as Attending Physician (Physical Medicine and Rehabilitation) Sinclair Ship, MD as Consulting Physician (Orthopedic Surgery) Juanita Craver, MD as Consulting Physician (Gastroenterology)   Medication Review Current Outpatient Prescriptions on File Prior to Visit  Medication Sig Dispense Refill  . acetaminophen (TYLENOL ARTHRITIS PAIN) 650 MG CR tablet Take 1,300 mg by mouth every 8 (eight) hours as needed. For pain      . alendronate (FOSAMAX) 70 MG tablet Take 1 tablet (70 mg total) by mouth once a week. Take with a full glass of water on an empty stomach.  12 tablet  1  . Ascorbic Acid (VITAMIN C PO) Take 1 tablet by mouth daily.      Marland Kitchen aspirin EC 81 MG tablet Take 81 mg by mouth daily.      Marland Kitchen atorvastatin (LIPITOR) 40 MG tablet Take 1 tablet (40 mg total) by mouth daily.  90 tablet  1  . benazepril-hydrochlorthiazide (LOTENSIN HCT) 20-25 MG per tablet Take 1 tablet by mouth daily.  90 tablet  1  . betaxolol (BETOPTIC-S) 0.25 % ophthalmic suspension Place 1 drop into both eyes 2 (two) times daily.      . Brinzolamide-Brimonidine (SIMBRINZA) 1-0.2 % SUSP Apply to eye. 2 drops each daily      . CALCIUM PO Take 500 mg by mouth daily.       . dorzolamide (TRUSOPT) 2 % ophthalmic solution Place 1 drop into both eyes 2 (two) times daily.      Marland Kitchen gabapentin (NEURONTIN) 100 MG capsule Take 100 mg by mouth 3 (three) times daily.      Marland Kitchen HYDROcodone-acetaminophen (NORCO/VICODIN) 5-325 MG per tablet Take 0.5 tablets by mouth 2 (two) times daily as needed for moderate pain.      Marland Kitchen LORazepam (ATIVAN) 2 MG tablet TAKE 1/2 TO 1 TABLET BY MOUTH 3 TIMES A DAY AS NEEDED FOR ANXIETY  90 tablet  1   . metFORMIN (GLUCOPHAGE-XR) 500 MG 24 hr tablet Take 1 tablet (500 mg total) by mouth at bedtime.  90 tablet  3  . methocarbamol (ROBAXIN) 500 MG tablet Take 1 tablet (500 mg total) by mouth 3 (three) times daily.  90 tablet  1  . Multiple Vitamin (MULTIVITAMIN WITH MINERALS) TABS tablet Take 1 tablet by mouth daily.      . ranitidine (ZANTAC) 300 MG capsule Take 1 capsule (300 mg total) by mouth every evening.  90 capsule  1  . travoprost, benzalkonium, (TRAVATAN) 0.004 % ophthalmic solution Place 1 drop into both eyes at bedtime.      . TRUETEST TEST test strip TEST DAILY  100 each  6  . VITAMIN E PO Take by mouth daily.       No current facility-administered medications on file prior to visit.    Current Problems (verified) Patient Active Problem List   Diagnosis Date Noted  . CKD (chronic kidney disease) stage 2, GFR 60-89 ml/min 11/16/2013  . Vitamin D Deficiency 06/04/2013  . Encounter for long-term (current) use of other medications 06/04/2013  . Type II or unspecified type diabetes mellitus with renal manifestations, not stated as uncontrolled   .  GERD (gastroesophageal reflux disease)   . Glaucoma   . Hyperlipidemia   . Hypertension   . COPD (chronic obstructive pulmonary disease)   . Unspecified asthma(493.90)   . Degenerative disc disease   . Hay fever   . Lumbar spondylosis 03/27/2012  . Disorder of sacroiliac joint 05/31/2011  . Myofascial pain syndrome 05/31/2011    Screening Tests Health Maintenance  Topic Date Due  . Ophthalmology Exam  [birth year]27/1956  . Urine Microalbumin  01[birth year]27/1958  . Influenza Vaccine  10/16/2013  . Pneumococcal Polysaccharide Vaccine Age 67 And Over  01/07/2014 (Originally 01/27/2013)  . Hemoglobin A1c  12/05/2013  . Foot Exam  06/08/2014  . Mammogram  08/01/2014  . Colonoscopy  06/16/2017  . Tetanus/tdap  03/19/2019  . Zostavax  Completed    Immunization History  Administered Date(s) Administered  . Pneumococcal-Unspecified  03/18/2005  . Tdap 03/18/2009  . Zoster 11/13/2012   Preventative care: Last colonoscopy:  2009 repeat 2019 Last mammogram: 07/2012 DUE Last pap smear/pelvic exam: remote  DEXA:07/2012 osteoporosis t -2.9 has been on fosamax since May 2014 CXR 04/2013 CT AB 2011  Prior vaccinations: TD or Tdap: 2011  Influenza: declines  Pneumococcal: 2007 needs repeat AFTER prevnar 13 Prevnar 13: DUE Shingles/Zostavax: 2014  History reviewed: allergies, current medications, past family history, past medical history, past social history, past surgical history and problem list  Risk Factors: Osteoporosis: postmenopausal estrogen deficiency and dietary calcium and/or vitamin D deficiency History of fracture in the past year: no  Tobacco History  Substance Use Topics  . Smoking status: Never Smoker   . Smokeless tobacco: Never Used  . Alcohol Use: No   She does not smoke.  Patient is not a former smoker. Are there smokers in your home (other than you)?  No  Alcohol Current alcohol use: none  Caffeine Current caffeine use: denies use  Exercise  Current exercise: walking 45 mins every day   Nutrition/Diet Current diet: in general, a "healthy" diet    Cardiac risk factors: advanced age (older than 1 for men, 66 for women), diabetes mellitus, dyslipidemia, hypertension and sedentary lifestyle.  Depression Screen (Note: if answer to either of the following is "Yes", a more complete depression screening is indicated)   Q1: Over the past two weeks, have you felt down, depressed or hopeless? No  Q2: Over the past two weeks, have you felt little interest or pleasure in doing things? No  Have you lost interest or pleasure in daily life? No  Do you often feel hopeless? No  Do you cry easily over simple problems? No  Activities of Daily Living In your present state of health, do you have any difficulty performing the following activities?:  Driving? No Managing money?  No Feeding  yourself? No Getting from bed to chair? No Climbing a flight of stairs? No Preparing food and eating?: No Bathing or showering? No Getting dressed: No Getting to the toilet? No Using the toilet:No Moving around from place to place: No In the past year have you fallen or had a near fall?:No   Are you sexually active?  No  Do you have more than one partner?  No  Vision Difficulties: Yes  Hearing Difficulties: No Do you often ask people to speak up or repeat themselves? No Do you experience ringing or noises in your ears? No Do you have difficulty understanding soft or whispered voices? No  Cognition  Do you feel that you have a problem with memory?No  Do you often  misplace items? No  Do you feel safe at home?  Yes  Advanced directives Does patient have a [place]? Yes Does patient have a Living Will? Yes   Objective:     Blood pressure 110/68, pulse 72, temperature 97.7 F (36.5 C), resp. rate 16, height 5\' 5"  (1.651 m), weight 148 lb (67.132 kg). Body mass index is 24.63 kg/(m^2).  General appearance: alert, no distress, WD/WN,  female Cognitive Testing  Alert? Yes  Normal Appearance?Yes  Oriented to person? Yes  Place? Yes   Time? Yes  Recall of three objects?  Yes  Can perform simple calculations? Yes  Displays appropriate judgment?Yes  Can read the correct time from a watch face?Yes  HEENT: normocephalic, sclerae anicteric, TMs pearly, nares patent, no discharge or erythema, pharynx normal Oral cavity: MMM, no lesions Neck: supple, no lymphadenopathy, no thyromegaly, no masses Heart: RRR, normal S1, S2, no murmurs Lungs: CTA bilaterally, no wheezes, rhonchi, or rales Abdomen: +bs, soft, non tender, non distended, no masses, no hepatomegaly, no splenomegaly Musculoskeletal: nontender, no swelling, no obvious deformity Extremities: no edema, no cyanosis, no clubbing Pulses: 2+ symmetric, upper and lower extremities, normal cap  refill Neurological: alert, oriented x 3, CN2-12 intact, strength normal upper extremities and lower extremities, sensation normal throughout, DTRs 2+ throughout, no cerebellar signs, gait antalgic walks with cane Psychiatric: normal affect, behavior normal, pleasant  Breast: nontender, no masses or lumps, no skin changes, no nipple discharge or inversion, no axillary lymphadenopathy Gyn: defer  Rectal: defer  Medicare Attestation I have personally reviewed: The patient's medical and social history Their use of alcohol, tobacco or illicit drugs Their current medications and supplements The patient's functional ability including ADLs,fall risks, home safety risks, cognitive, and hearing and visual impairment Diet and physical activities Evidence for depression or mood disorders  The patient's weight, height, BMI, and visual acuity have been recorded in the chart.  I have made referrals, counseling, and provided education to the patient based on review of the above and I have provided the patient with a written personalized care plan for preventive services.     Vicie Mutters, PA-C   11/16/2013

## 2013-11-16 NOTE — Patient Instructions (Signed)

## 2013-11-17 LAB — VITAMIN D 25 HYDROXY (VIT D DEFICIENCY, FRACTURES): VIT D 25 HYDROXY: 62 ng/mL (ref 30–89)

## 2013-11-26 ENCOUNTER — Encounter: Payer: Self-pay | Admitting: Internal Medicine

## 2013-12-15 ENCOUNTER — Telehealth: Payer: Self-pay | Admitting: *Deleted

## 2013-12-15 NOTE — Telephone Encounter (Signed)
Patient called and states she is having severe pain in her back, chest and right arm.  Per Dr Melford Aase, patient needs to go to the ER to be evaluated.  Spoke to patient's sister to relay message because patient was hurting too badly to come to the phone.

## 2013-12-21 ENCOUNTER — Encounter (INDEPENDENT_AMBULATORY_CARE_PROVIDER_SITE_OTHER): Payer: Self-pay

## 2014-01-05 ENCOUNTER — Other Ambulatory Visit: Payer: Self-pay | Admitting: *Deleted

## 2014-01-05 MED ORDER — RANITIDINE HCL 300 MG PO TABS: 300.0000 mg | ORAL_TABLET | Freq: Every day | ORAL | Status: DC

## 2014-01-14 ENCOUNTER — Encounter: Payer: Self-pay | Admitting: Internal Medicine

## 2014-01-18 ENCOUNTER — Ambulatory Visit: Payer: Self-pay | Admitting: Internal Medicine

## 2014-01-21 ENCOUNTER — Encounter: Payer: Self-pay | Admitting: Internal Medicine

## 2014-02-17 ENCOUNTER — Ambulatory Visit: Payer: Self-pay | Admitting: Internal Medicine

## 2014-02-19 ENCOUNTER — Ambulatory Visit (HOSPITAL_COMMUNITY): Payer: Commercial Managed Care - HMO | Attending: Physician Assistant

## 2014-02-19 ENCOUNTER — Encounter (HOSPITAL_COMMUNITY): Payer: Self-pay | Admitting: Emergency Medicine

## 2014-02-19 ENCOUNTER — Emergency Department (HOSPITAL_COMMUNITY)
Admission: EM | Admit: 2014-02-19 | Discharge: 2014-02-19 | Disposition: A | Discharge status: Home / Self Care | Payer: Commercial Managed Care - HMO | Source: Home / Self Care | Attending: Emergency Medicine | Admitting: Emergency Medicine

## 2014-02-19 DIAGNOSIS — R05 Cough: Secondary | ICD-10-CM | POA: Insufficient documentation

## 2014-02-19 DIAGNOSIS — R059 Cough, unspecified: Secondary | ICD-10-CM

## 2014-02-19 DIAGNOSIS — J189 Pneumonia, unspecified organism: Secondary | ICD-10-CM

## 2014-02-19 MED ORDER — IPRATROPIUM-ALBUTEROL 0.5-2.5 (3) MG/3ML IN SOLN
3.0000 mL | Freq: Once | RESPIRATORY_TRACT | Status: AC
Start: 2014-02-19 — End: 2014-02-19
  Administered 2014-02-19: 3 mL via RESPIRATORY_TRACT

## 2014-02-19 MED ORDER — ALBUTEROL SULFATE HFA 108 (90 BASE) MCG/ACT IN AERS: 1.0000 | INHALATION_SPRAY | Freq: Four times a day (QID) | RESPIRATORY_TRACT | Status: DC | PRN

## 2014-02-19 MED ORDER — AZITHROMYCIN 250 MG PO TABS: 250.0000 mg | ORAL_TABLET | Freq: Every day | ORAL | Status: DC

## 2014-02-19 MED ORDER — BENZONATATE 100 MG PO CAPS: 100.0000 mg | ORAL_CAPSULE | Freq: Three times a day (TID) | ORAL | Status: DC | PRN

## 2014-02-19 NOTE — Discharge Instructions (Signed)
Cough, Adult ° A cough is a reflex that helps clear your throat and airways. It can help heal the body or may be a reaction to an irritated airway. A cough may only last 2 or 3 weeks (acute) or may last more than 8 weeks (chronic).  °CAUSES °Acute cough: °· Viral or bacterial infections. °Chronic cough: °· Infections. °· Allergies. °· Asthma. °· Post-nasal drip. °· Smoking. °· Heartburn or acid reflux. °· Some medicines. °· Chronic lung problems (COPD). °· Cancer. °SYMPTOMS  °· Cough. °· Fever. °· Chest pain. °· Increased breathing rate. °· High-pitched whistling sound when breathing (wheezing). °· Colored mucus that you cough up (sputum). °TREATMENT  °· A bacterial cough may be treated with antibiotic medicine. °· A viral cough must run its course and will not respond to antibiotics. °· Your caregiver may recommend other treatments if you have a chronic cough. °HOME CARE INSTRUCTIONS  °· Only take over-the-counter or prescription medicines for pain, discomfort, or fever as directed by your caregiver. Use cough suppressants only as directed by your caregiver. °· Use a cold steam vaporizer or humidifier in your bedroom or home to help loosen secretions. °· Sleep in a semi-upright position if your cough is worse at night. °· Rest as needed. °· Stop smoking if you smoke. °SEEK IMMEDIATE MEDICAL CARE IF:  °· You have pus in your sputum. °· Your cough starts to worsen. °· You cannot control your cough with suppressants and are losing sleep. °· You begin coughing up blood. °· You have difficulty breathing. °· You develop pain which is getting worse or is uncontrolled with medicine. °· You have a fever. °MAKE SURE YOU:  °· Understand these instructions. °· Will watch your condition. °· Will get help right away if you are not doing well or get worse. °Document Released: 08/31/2010 Document Revised: 05/27/2011 Document Reviewed: 08/31/2010 °ExitCare® Patient Information ©2015 [employer]. This information is not intended  to replace advice given to you by your health care provider. Make sure you discuss any questions you have with your health care provider. °Pneumonia °Pneumonia is an infection of the lungs.  °CAUSES °Pneumonia may be caused by bacteria or a virus. Usually, these infections are caused by breathing infectious particles into the lungs (respiratory tract). °SIGNS AND SYMPTOMS  °· Cough. °· Fever. °· Chest pain. °· Increased rate of breathing. °· Wheezing. °· Mucus production. °DIAGNOSIS  °If you have the common symptoms of pneumonia, your health care provider will typically confirm the diagnosis with a chest X-ray. The X-ray will show an abnormality in the lung (pulmonary infiltrate) if you have pneumonia. Other tests of your blood, urine, or sputum may be done to find the specific cause of your pneumonia. Your health care provider may also do tests (blood gases or pulse oximetry) to see how well your lungs are working. °TREATMENT  °Some forms of pneumonia may be spread to other people when you cough or sneeze. You may be asked to wear a mask before and during your exam. Pneumonia that is caused by bacteria is treated with antibiotic medicine. Pneumonia that is caused by the influenza virus may be treated with an antiviral medicine. Most other viral infections must run their course. These infections will not respond to antibiotics.  °HOME CARE INSTRUCTIONS  °· Cough suppressants may be used if you are losing too much rest. However, coughing protects you by clearing your lungs. You should avoid using cough suppressants if you can. °· Your health care provider may have   prescribed medicine if he or she thinks your pneumonia is caused by bacteria or influenza. Finish your medicine even if you start to feel better. °· Your health care provider may also prescribe an expectorant. This loosens the mucus to be coughed up. °· Take medicines only as directed by your health care provider. °· Do not smoke. Smoking is a common cause  of bronchitis and can contribute to pneumonia. If you are a smoker and continue to smoke, your cough may last several weeks after your pneumonia has cleared. °· A cold steam vaporizer or humidifier in your room or home may help loosen mucus. °· Coughing is often worse at night. Sleeping in a semi-upright position in a recliner or using a couple pillows under your head will help with this. °· Get rest as you feel it is needed. Your body will usually let you know when you need to rest. °PREVENTION °A pneumococcal shot (vaccine) is available to prevent a common bacterial cause of pneumonia. This is usually suggested for: °· People over 65 years old. °· Patients on chemotherapy. °· People with chronic lung problems, such as bronchitis or emphysema. °· People with immune system problems. °If you are over 65 or have a high risk condition, you may receive the pneumococcal vaccine if you have not received it before. In some countries, a routine influenza vaccine is also recommended. This vaccine can help prevent some cases of pneumonia. You may be offered the influenza vaccine as part of your care. °If you smoke, it is time to quit. You may receive instructions on how to stop smoking. Your health care provider can provide medicines and counseling to help you quit. °SEEK MEDICAL CARE IF: °You have a fever. °SEEK IMMEDIATE MEDICAL CARE IF:  °· Your illness becomes worse. This is especially true if you are elderly or weakened from any other disease. °· You cannot control your cough with suppressants and are losing sleep. °· You begin coughing up blood. °· You develop pain which is getting worse or is uncontrolled with medicines. °· Any of the symptoms which initially brought you in for treatment are getting worse rather than better. °· You develop shortness of breath or chest pain. °MAKE SURE YOU:  °· Understand these instructions. °· Will watch your condition. °· Will get help right away if you are not doing well or get  worse. °Document Released: 03/04/2005 Document Revised: 07/19/2013 Document Reviewed: 05/24/2010 °ExitCare® Patient Information ©2015 [employer]. This information is not intended to replace advice given to you by your health care provider. Make sure you discuss any questions you have with your health care provider. ° °

## 2014-02-19 NOTE — ED Notes (Signed)
Patient in waiting room until transport arrives to take her to Radiology for her CXR

## 2014-02-19 NOTE — ED Notes (Signed)
Cough, dry, non-productive cough for 2 weeks.

## 2014-02-19 NOTE — ED Notes (Signed)
Patient sent to [facility]ology prior to receiving breathing treatment

## 2014-02-19 NOTE — ED Provider Notes (Addendum)
CSN: [id number]     Arrival date & time 02/19/14  1042 History   First MD Initiated Contact with Patient 02/19/14 1125     Chief Complaint  Patient presents with  . Cough   (Consider location/radiation/quality/duration/timing/severity/associated sxs/prior Treatment) HPI Comments: Reports two weeks of dry cough that has become progressively worse and endorses development of fever, malaise and tachycardia over the past 24 hours. Non-smoker Little relief at home with Delsym, Robitussin, tylenol, NyQuil, Mucinex.  Patient is a 68 y.o. female presenting with cough. The history is provided by the patient.  Cough Cough characteristics:  Non-productive Severity:  Moderate Onset quality:  Gradual Duration:  2 weeks Timing:  Constant Progression:  Worsening Chronicity:  New Smoker: no   Associated symptoms: fever and wheezing   Associated symptoms: no chest pain, no rhinorrhea, no shortness of breath and no sore throat     Past Medical History  Diagnosis Date  . Sarcoidosis >65yrs ago    no problems since  . DeQuervain's disease (tenosynovitis)   . Diverticulitis   . Bronchitis     hx of;>30yrs ago  . Headache(784.0)     related neck issues  . Dizziness     related to neck issues  . Chronic neck pain     arthritis  . History of shingles 2012  . Insomnia     d/t pain and takes Ativan nightly  . GERD (gastroesophageal reflux disease)     takes Ranitidine nightly  . Glaucoma   . High cholesterol     takes Lipitor daily  . Hypertension     takes Lotensin HCT daily  . COPD (chronic obstructive pulmonary disease)   . Asthma   . Degenerative disc disease     neck  . Frozen shoulder September 2012  . Hay fever   . Type II or unspecified type diabetes mellitus without mention of complication, not stated as uncontrolled     takes Metformin nightly  . Osteoporosis    Past Surgical History  Procedure Laterality Date  . Carpal tunnel release  2012  . Wrist surgery      ORIF  left wrist and then removed d/t protruding screw in 2012  . Breast surgery  20+yrs ago    left breast;benign  . Tonsillectomy  30+yrs ago  . Abdominal hysterectomy  1975  . Refractive surgery  2011    bilateral  . Colonoscopy    . Anterior cervical decomp/discectomy fusion  07/31/2011    Procedure: ANTERIOR CERVICAL DECOMPRESSION/DISCECTOMY FUSION 3 LEVELS;  Surgeon: Hosie Spangle, MD;  Location: [place] [facility];  Service: Neurosurgery;  Laterality: N/A;  Cervical four-five,Cervical five-six,Cervical six-seven anterior cervical decompression with fusion plating and bonegraft   Family History  Problem Relation Age of Onset  . Cancer Mother 22    breast cancer  . Pneumonia Father   . Leukemia Father   . Glaucoma Father   . Anesthesia problems Neg Hx   . Hypotension Neg Hx   . Malignant hyperthermia Neg Hx   . Pseudochol deficiency Neg Hx   . Mitral valve prolapse Sister   . Hypertension Sister   . HIV Son    History  Substance Use Topics  . Smoking status: Never Smoker   . Smokeless tobacco: Never Used  . Alcohol Use: No   OB History    No data available     Review of Systems  Constitutional: Positive for fever.  HENT: Negative for rhinorrhea and sore throat.  Respiratory: Positive for cough and wheezing. Negative for chest tightness and shortness of breath.   Cardiovascular: Negative for chest pain.  Gastrointestinal: Negative.     Allergies  Penicillins; Aspirin; Sulfa antibiotics; and Codeine  Home Medications   Prior to Admission medications   Medication Sig Start Date End Date Taking? Authorizing Provider  acetaminophen (TYLENOL ARTHRITIS PAIN) 650 MG CR tablet Take 1,300 mg by mouth every 8 (eight) hours as needed. For pain    Historical Provider, MD  alendronate (FOSAMAX) 70 MG tablet Take 1 tablet (70 mg total) by mouth once a week. Take with a full glass of water on an empty stomach. 10/19/13   Unk Pinto, MD  Ascorbic Acid (VITAMIN C PO) Take 1 tablet  by mouth daily.    Historical Provider, MD  aspirin EC 81 MG tablet Take 81 mg by mouth daily.    Historical Provider, MD  atorvastatin (LIPITOR) 40 MG tablet Take 1 tablet (40 mg total) by mouth daily. 10/19/13   Unk Pinto, MD  benazepril-hydrochlorthiazide (LOTENSIN HCT) 20-25 MG per tablet Take 1 tablet by mouth daily. 10/19/13   Unk Pinto, MD  betaxolol (BETOPTIC-S) 0.25 % ophthalmic suspension Place 1 drop into both eyes 2 (two) times daily.    Historical Provider, MD  Brinzolamide-Brimonidine [facility]) 1-0.2 % SUSP Apply to eye. 2 drops each daily    Historical Provider, MD  CALCIUM PO Take 500 mg by mouth daily.     Historical Provider, MD  dorzolamide (TRUSOPT) 2 % ophthalmic solution Place 1 drop into both eyes 2 (two) times daily.    Historical Provider, MD  gabapentin (NEURONTIN) 100 MG capsule Take 100 mg by mouth 3 (three) times daily.    Historical Provider, MD  HYDROcodone-acetaminophen (NORCO/VICODIN) 5-325 MG per tablet Take 0.5 tablets by mouth 2 (two) times daily as needed for moderate pain.    Historical Provider, MD  LORazepam (ATIVAN) 2 MG tablet TAKE 1/2 TO 1 TABLET BY MOUTH 3 TIMES A DAY AS NEEDED FOR ANXIETY 10/18/13   Unk Pinto, MD  metFORMIN (GLUCOPHAGE-XR) 500 MG 24 hr tablet Take 1 tablet (500 mg total) by mouth at bedtime. 10/25/13   Unk Pinto, MD  methocarbamol (ROBAXIN) 500 MG tablet Take 1 tablet (500 mg total) by mouth 3 (three) times daily. 08/07/12   Charlett Blake, MD  Multiple Vitamin (MULTIVITAMIN WITH MINERALS) TABS tablet Take 1 tablet by mouth daily.    Historical Provider, MD  ranitidine (ZANTAC) 300 MG tablet Take 1 tablet (300 mg total) by mouth at bedtime. 01/05/14   Unk Pinto, MD  traMADol (ULTRAM) 50 MG tablet Take 50 mg by mouth 2 (two) times daily.    Historical Provider, MD  travoprost, benzalkonium, (TRAVATAN) 0.004 % ophthalmic solution Place 1 drop into both eyes at bedtime.    Historical Provider, MD  TRUETEST TEST  test strip TEST DAILY 06/23/13   Ardis Hughs, PA-C  VITAMIN E PO Take by mouth daily.    Historical Provider, MD   BP 121/78 mmHg  Pulse 109  Temp(Src) 99.8 F (37.7 C) (Oral)  Resp 12  SpO2 95% Physical Exam  Constitutional: She is oriented to person, place, and time. She appears well-developed and well-nourished. No distress.  HENT:  Head: Normocephalic and atraumatic.  Eyes: Conjunctivae are normal. No scleral icterus.  Neck: Normal range of motion. Neck supple.  Cardiovascular: Regular rhythm and normal heart sounds.   +mild tachycardia  Pulmonary/Chest: Effort normal. No respiratory distress. She has decreased  breath sounds in the right lower field. She has wheezes. She has rhonchi in the right lower field.  Musculoskeletal: Normal range of motion.  Neurological: She is alert and oriented to person, place, and time.  Skin: Skin is warm and dry.  Psychiatric: She has a normal mood and affect. Her behavior is normal.  Nursing note and vitals reviewed.   ED Course  Procedures (including critical care time) Labs Review Labs Reviewed - No data to display  Imaging Review Dg Chest 2 View  02/19/2014   CLINICAL DATA:  Cough 2 weeks  EXAM: CHEST  2 VIEW  COMPARISON:  04/26/2013  FINDINGS: Upper normal heart size. Linear opacity at the left lateral apex is stable. Nodular opacities in the right upper lobe and right apex are stable. No new pulmonary opacities. No pleural effusion and no pneumothorax. Hyperaeration. Bronchitic changes. Scoliosis.  IMPRESSION: Scattered scarring and hyperaeration.  No acute disease.   Electronically Signed   By: Maryclare Bean M.D.   On: 02/19/2014 13:08     MDM   1. CAP (community acquired pneumonia)   2. Cough   While no organized infiltrate on CXR, my exam concerns me for developing infection at right lower lung field. Will provide treatment for early CAP and treat with 5 day course of Azithromycin and Albuterol MDI. Will advise close follow up with  PCP if symptoms do not begin to improve on above therapy.     Lutricia Feil, [place] 02/19/14 1320  02/23/2014 Addendum: On DOS patient receive DuoNeb while at [facility] for mild bronchospasm without associated hypoxia.   Lutricia Feil, PA 02/23/14 [address] [place] 02/23/14 [phone]

## 2014-02-22 ENCOUNTER — Ambulatory Visit (INDEPENDENT_AMBULATORY_CARE_PROVIDER_SITE_OTHER): Payer: Commercial Managed Care - HMO | Admitting: Internal Medicine

## 2014-02-22 ENCOUNTER — Encounter: Payer: Self-pay | Admitting: Internal Medicine

## 2014-02-22 VITALS — BP 126/74 | HR 88 | Temp 99.7°F | Resp 16 | Ht 65.0 in | Wt 150.6 lb

## 2014-02-22 DIAGNOSIS — I10 Essential (primary) hypertension: Secondary | ICD-10-CM

## 2014-02-22 DIAGNOSIS — E559 Vitamin D deficiency, unspecified: Secondary | ICD-10-CM

## 2014-02-22 DIAGNOSIS — N182 Chronic kidney disease, stage 2 (mild): Secondary | ICD-10-CM

## 2014-02-22 DIAGNOSIS — Z79899 Other long term (current) drug therapy: Secondary | ICD-10-CM

## 2014-02-22 DIAGNOSIS — E7800 Pure hypercholesterolemia, unspecified: Secondary | ICD-10-CM

## 2014-02-22 DIAGNOSIS — E1122 Type 2 diabetes mellitus with diabetic chronic kidney disease: Secondary | ICD-10-CM

## 2014-02-22 LAB — CBC WITH DIFFERENTIAL/PLATELET
BASOS ABS: 0 10*3/uL (ref 0.0–0.1)
Basophils Relative: 1 % (ref 0–1)
EOS ABS: 0.1 10*3/uL (ref 0.0–0.7)
EOS PCT: 3 % (ref 0–5)
HCT: 34.6 % — ABNORMAL LOW (ref 36.0–46.0)
Hemoglobin: 11.9 g/dL — ABNORMAL LOW (ref 12.0–15.0)
Lymphocytes Relative: 38 % (ref 12–46)
Lymphs Abs: 1.5 10*3/uL (ref 0.7–4.0)
MCH: 29.8 pg (ref 26.0–34.0)
MCHC: 34.4 g/dL (ref 30.0–36.0)
MCV: 86.7 fL (ref 78.0–100.0)
MONO ABS: 0.4 10*3/uL (ref 0.1–1.0)
MPV: 9.1 fL — AB (ref 9.4–12.4)
Monocytes Relative: 10 % (ref 3–12)
Neutro Abs: 1.9 10*3/uL (ref 1.7–7.7)
Neutrophils Relative %: 48 % (ref 43–77)
PLATELETS: 288 10*3/uL (ref 150–400)
RBC: 3.99 MIL/uL (ref 3.87–5.11)
RDW: 13.4 % (ref 11.5–15.5)
WBC: 3.9 10*3/uL — ABNORMAL LOW (ref 4.0–10.5)

## 2014-02-22 LAB — HEMOGLOBIN A1C
Hgb A1c MFr Bld: 6.5 % — ABNORMAL HIGH (ref <5.7)
MEAN PLASMA GLUCOSE: 140 mg/dL — AB (ref <117)

## 2014-02-22 NOTE — Patient Instructions (Signed)

## 2014-02-22 NOTE — Progress Notes (Signed)
Patient ID: Sara [name], female   DOB: 1946-03-04, 68 y.o.   MRN: [id number]   This very nice 68 y.o.female presents for 3 month follow up with Hypertension, Hyperlipidemia, Pre-Diabetes and Vitamin D Deficiency. Patient was treated 4 days ago in [facility] and was Rx'd a Z Pak (day 4/5) for a bronchitis / ? possible small bronchopneumonia and is admittedly much improved.    Patient is treated for HTN (2003) & BP has been controlled at home. Today's BP: 126/74 mmHg. Patient has had no complaints of any cardiac type chest pain, palpitations, dyspnea/orthopnea/PND, dizziness, claudication, or dependent edema.   Hyperlipidemia is controlled with diet & meds. Patient denies myalgias or other med SE's. Last Lipids were at goal - Total Chol 151; HDL 55; LDL 82; & Trig 69 on 11/16/2013.   Also, the patient has history of diet managed T2_NIDDM w/CKD2  (A1c was 7.4% in 2011) and has had no symptoms of reactive hypoglycemia, diabetic polys, paresthesias or visual blurring.  Last A1c was 6.6% on 11/16/2013.    Further, the patient also has history of Vitamin D Deficiency of 42 in 2012 and supplements vitamin D without any suspected side-effects. Last vitamin D was 62 on  11/16/2013.   Medication List   alendronate 70 MG tablet  Commonly known as:  FOSAMAX  Take 1 tablet (70 mg total) by mouth once a week. Take with a full glass of water on an empty stomach.     aspirin EC 81 MG tablet  Take 81 mg by mouth daily.     atorvastatin 40 MG tablet  Commonly known as:  LIPITOR  Take 1 tablet (40 mg total) by mouth daily.     benazepril-hydrochlorthiazide 20-25 MG per tablet  Commonly known as:  LOTENSIN HCT  Take 1 tablet by mouth daily.     betaxolol 0.25 % ophthalmic suspension  Commonly known as:  BETOPTIC-S  Place 1 drop into both eyes 2 (two) times daily.     CALCIUM PO  Take 500 mg by mouth daily.     dorzolamide 2 % ophthalmic solution  Commonly known as:  TRUSOPT  Place 1 drop into both  eyes 2 (two) times daily.     gabapentin 100 MG capsule  Commonly known as:  NEURONTIN  Take 100 mg by mouth 3 (three) times daily.     HYDROcodone-acetaminophen 5-325 MG per tablet  Commonly known as:  NORCO/VICODIN  Take 0.5 tablets by mouth 2 (two) times daily as needed for moderate pain.     LORazepam 2 MG tablet  Commonly known as:  ATIVAN  TAKE 1/2 TO 1 TABLET BY MOUTH 3 TIMES A DAY AS NEEDED FOR ANXIETY     metFORMIN 500 MG 24 hr tablet  Commonly known as:  GLUCOPHAGE-XR  Take 1 tablet (500 mg total) by mouth at bedtime.     multivitamin with minerals Tabs tablet  Take 1 tablet by mouth daily.     ranitidine 300 MG tablet  Commonly known as:  ZANTAC  Take 1 tablet (300 mg total) by mouth at bedtime.     SIMBRINZA 1-0.2 % Susp  Generic drug:  Brinzolamide-Brimonidine  Apply to eye. 2 drops each daily     traMADol 50 MG tablet  Commonly known as:  ULTRAM  Take 50 mg by mouth 2 (two) times daily.     TRUETEST TEST test strip  Generic drug:  glucose blood  TEST DAILY     TYLENOL ARTHRITIS PAIN  650 MG CR tablet  Generic drug:  acetaminophen  Take 1,300 mg by mouth every 8 (eight) hours as needed. For pain     VITAMIN C PO  Take 1 tablet by mouth daily.     VITAMIN E PO  Take by mouth daily.     Allergies  Allergen Reactions  . Penicillins Shortness Of Breath    And hives  . Aspirin Other (See Comments)    Stomach upset, but low dose is ok  . Sulfa Antibiotics Other (See Comments)    unknown  . Codeine Rash   PMHx:   Past Medical History  Diagnosis Date  . Sarcoidosis >5yrs ago    no problems since  . DeQuervain's disease (tenosynovitis)   . Diverticulitis   . Bronchitis     hx of;>51yrs ago  . Headache(784.0)     related neck issues  . Dizziness     related to neck issues  . Chronic neck pain     arthritis  . History of shingles 2012  . Insomnia     d/t pain and takes Ativan nightly  . GERD (gastroesophageal reflux disease)     takes  Ranitidine nightly  . Glaucoma   . High cholesterol     takes Lipitor daily  . Hypertension     takes Lotensin HCT daily  . COPD (chronic obstructive pulmonary disease)   . Asthma   . Degenerative disc disease     neck  . Frozen shoulder September 2012  . Hay fever   . Type II or unspecified type diabetes mellitus without mention of complication, not stated as uncontrolled     takes Metformin nightly  . Osteoporosis    Immunization History  Administered Date(s) Administered  . Pneumococcal Conjugate-13 11/16/2013  . Pneumococcal-Unspecified 03/18/2005  . Tdap 03/18/2009  . Zoster 11/13/2012   Past Surgical History  Procedure Laterality Date  . Carpal tunnel release  2012  . Wrist surgery      ORIF left wrist and then removed d/t protruding screw in 2012  . Breast surgery  20+yrs ago    left breast;benign  . Tonsillectomy  30+yrs ago  . Abdominal hysterectomy  1975  . Refractive surgery  2011    bilateral  . Colonoscopy    . Anterior cervical decomp/discectomy fusion  07/31/2011    Procedure: ANTERIOR CERVICAL DECOMPRESSION/DISCECTOMY FUSION 3 LEVELS;  Surgeon: Hosie Spangle, MD;  Location: [place] [facility];  Service: Neurosurgery;  Laterality: N/A;  Cervical four-five,Cervical five-six,Cervical six-seven anterior cervical decompression with fusion plating and bonegraft   FHx:    Reviewed / unchanged  SHx:    Reviewed / unchanged  Systems Review:  Constitutional: Denies fever, chills, wt changes, headaches, insomnia, fatigue, night sweats, change in appetite. Eyes: Denies redness, blurred vision, diplopia, discharge, itchy, watery eyes.  ENT: Denies discharge, congestion, post nasal drip, epistaxis, sore throat, earache, hearing loss, dental pain, tinnitus, vertigo, sinus pain, snoring.  CV: Denies chest pain, palpitations, irregular heartbeat, syncope, dyspnea, diaphoresis, orthopnea, PND, claudication or edema. Respiratory: denies cough, dyspnea, DOE, pleurisy,  hoarseness, laryngitis, wheezing.  Gastrointestinal: Denies dysphagia, odynophagia, heartburn, reflux, water brash, abdominal pain or cramps, nausea, vomiting, bloating, diarrhea, constipation, hematemesis, melena, hematochezia  or hemorrhoids. Genitourinary: Denies dysuria, frequency, urgency, nocturia, hesitancy, discharge, hematuria or flank pain. Musculoskeletal: Denies arthralgias, myalgias, stiffness, jt. swelling, pain, limping or strain/sprain.  Skin: Denies pruritus, rash, hives, warts, acne, eczema or change in skin lesion(s). Neuro: No weakness, tremor, incoordination, spasms, paresthesia  or pain. Psychiatric: Denies confusion, memory loss or sensory loss. Endo: Denies change in weight, skin or hair change.  Heme/Lymph: No excessive bleeding, bruising or enlarged lymph nodes.  Physical Exam  BP 126/74   Pulse 88  Temp 99.7 F  Resp 16  Ht 5\' 5"    Wt 150 lb 9.6 oz   BMI 25.06   Appears well nourished and in no distress. Eyes: PERRLA, EOMs, conjunctiva no swelling or erythema. Sinuses: No frontal/maxillary tenderness ENT/Mouth: EAC's clear, TM's nl w/o erythema, bulging. Nares clear w/o erythema, swelling, exudates. Oropharynx clear without erythema or exudates. Oral hygiene is good. Tongue normal, non obstructing. Hearing intact.  Neck: Supple. Thyroid nl. Car 2+/2+ without bruits, nodes or JVD. Chest: Respirations nl with BS clear & equal w/o rales, rhonchi, wheezing or stridor.  Cor: Heart sounds normal w/ regular rate and rhythm without sig. murmurs, gallops, clicks, or rubs. Peripheral pulses normal and equal  without edema.  Abdomen: Soft & bowel sounds normal. Non-tender w/o guarding, rebound, hernias, masses, or organomegaly.  Lymphatics: Unremarkable.  Musculoskeletal: Full ROM all peripheral extremities, joint stability, 5/5 strength, and normal gait.  Skin: Warm, dry without exposed rashes, lesions or ecchymosis apparent.  Neuro: Cranial nerves intact, reflexes  equal bilaterally. Sensory-motor testing grossly intact. Tendon reflexes grossly intact.  Pysch: Alert & oriented x 3.  Insight and judgement nl & appropriate. No ideations.  Assessment and Plan:  1. Hypertension - Continue monitor blood pressure at home. Continue diet/meds same.  2. Hyperlipidemia - Continue diet/meds, exercise,& lifestyle modifications. Continue monitor periodic cholesterol/liver & renal functions   3. T2_NIDDM w/CKD2 - Continue diet, exercise, lifestyle modifications. Monitor appropriate labs.  4. Vitamin D Deficiency - Continue supplementation.   Recommended regular exercise, BP monitoring, weight control, and discussed med and SE's. Recommended labs to assess and monitor clinical status. Further disposition pending results of labs.

## 2014-02-23 LAB — BASIC METABOLIC PANEL WITH GFR
BUN: 8 mg/dL (ref 6–23)
CALCIUM: 9.4 mg/dL (ref 8.4–10.5)
CO2: 28 mEq/L (ref 19–32)
CREATININE: 0.72 mg/dL (ref 0.50–1.10)
Chloride: 102 mEq/L (ref 96–112)
GFR, Est African American: >89 mL/min
GFR, Est Non African American: 88 mL/min
Glucose, Bld: 122 mg/dL — ABNORMAL HIGH (ref 70–99)
Potassium: 4.4 mEq/L (ref 3.5–5.3)
Sodium: 137 mEq/L (ref 135–145)

## 2014-02-23 LAB — LIPID PANEL
Cholesterol: 137 mg/dL (ref 0–200)
HDL: 45 mg/dL (ref >39)
LDL CALC: 78 mg/dL (ref 0–99)
Total CHOL/HDL Ratio: 3 Ratio
Triglycerides: 71 mg/dL (ref <150)
VLDL: 14 mg/dL (ref 0–40)

## 2014-02-23 LAB — HEPATIC FUNCTION PANEL
ALBUMIN: 3.8 g/dL (ref 3.5–5.2)
ALK PHOS: 52 U/L (ref 39–117)
ALT: 14 U/L (ref 0–35)
AST: 17 U/L (ref 0–37)
BILIRUBIN TOTAL: 0.3 mg/dL (ref 0.2–1.2)
Bilirubin, Direct: 0.1 mg/dL (ref 0.0–0.3)
Indirect Bilirubin: 0.2 mg/dL (ref 0.2–1.2)
TOTAL PROTEIN: 6.2 g/dL (ref 6.0–8.3)

## 2014-02-23 LAB — MAGNESIUM: MAGNESIUM: 2.2 mg/dL (ref 1.5–2.5)

## 2014-02-23 LAB — INSULIN, FASTING: INSULIN FASTING, SERUM: 13.4 u[IU]/mL (ref 2.0–19.6)

## 2014-02-23 LAB — VITAMIN D 25 HYDROXY (VIT D DEFICIENCY, FRACTURES): VIT D 25 HYDROXY: 49 ng/mL (ref 30–100)

## 2014-02-23 LAB — TSH: TSH: 1.289 u[IU]/mL (ref 0.350–4.500)

## 2014-03-08 ENCOUNTER — Other Ambulatory Visit: Payer: Self-pay | Admitting: *Deleted

## 2014-03-08 MED ORDER — ALENDRONATE SODIUM 70 MG PO TABS: 70.0000 mg | ORAL_TABLET | ORAL | Status: DC

## 2014-04-11 ENCOUNTER — Other Ambulatory Visit: Payer: Self-pay | Admitting: Emergency Medicine

## 2014-04-11 MED ORDER — GABAPENTIN 100 MG PO CAPS: 100.0000 mg | ORAL_CAPSULE | Freq: Three times a day (TID) | ORAL | Status: DC

## 2014-04-11 MED ORDER — ACCU-CHEK AVIVA PLUS W/DEVICE KIT: PACK | Status: DC

## 2014-04-11 MED ORDER — GLUCOSE BLOOD VI STRP: ORAL_STRIP | Status: DC

## 2014-04-19 ENCOUNTER — Other Ambulatory Visit: Payer: Self-pay | Admitting: *Deleted

## 2014-04-25 ENCOUNTER — Other Ambulatory Visit: Payer: Self-pay | Admitting: *Deleted

## 2014-04-25 MED ORDER — ACCU-CHEK AVIVA PLUS W/DEVICE KIT: PACK | Status: DC

## 2014-04-25 MED ORDER — GLUCOSE BLOOD VI STRP: ORAL_STRIP | Status: DC

## 2014-04-25 MED ORDER — GABAPENTIN 100 MG PO CAPS: 100.0000 mg | ORAL_CAPSULE | Freq: Three times a day (TID) | ORAL | Status: DC

## 2014-05-03 ENCOUNTER — Other Ambulatory Visit: Payer: Self-pay | Admitting: Internal Medicine

## 2014-05-03 ENCOUNTER — Other Ambulatory Visit: Payer: Self-pay | Admitting: Physician Assistant

## 2014-05-03 ENCOUNTER — Other Ambulatory Visit: Payer: Self-pay

## 2014-05-03 MED ORDER — LORAZEPAM 2 MG PO TABS: ORAL_TABLET | ORAL | Status: DC

## 2014-05-24 ENCOUNTER — Ambulatory Visit: Payer: Self-pay | Admitting: Internal Medicine

## 2014-05-30 ENCOUNTER — Other Ambulatory Visit: Payer: Self-pay | Admitting: *Deleted

## 2014-05-30 DIAGNOSIS — H2513 Age-related nuclear cataract, bilateral: Secondary | ICD-10-CM | POA: Diagnosis not present

## 2014-05-30 DIAGNOSIS — H4011X3 Primary open-angle glaucoma, severe stage: Secondary | ICD-10-CM | POA: Diagnosis not present

## 2014-05-30 MED ORDER — RANITIDINE HCL 300 MG PO TABS: 300.0000 mg | ORAL_TABLET | Freq: Every day | ORAL | Status: DC

## 2014-05-31 ENCOUNTER — Other Ambulatory Visit: Payer: Self-pay | Admitting: *Deleted

## 2014-05-31 MED ORDER — RANITIDINE HCL 300 MG PO TABS: 300.0000 mg | ORAL_TABLET | Freq: Every day | ORAL | Status: DC

## 2014-06-01 ENCOUNTER — Ambulatory Visit (INDEPENDENT_AMBULATORY_CARE_PROVIDER_SITE_OTHER): Payer: Commercial Managed Care - HMO | Admitting: Internal Medicine

## 2014-06-01 ENCOUNTER — Encounter: Payer: Self-pay | Admitting: Internal Medicine

## 2014-06-01 VITALS — BP 98/60 | HR 80 | Temp 98.2°F | Resp 16 | Ht 65.0 in | Wt 150.0 lb

## 2014-06-01 DIAGNOSIS — N189 Chronic kidney disease, unspecified: Secondary | ICD-10-CM | POA: Diagnosis not present

## 2014-06-01 DIAGNOSIS — E7800 Pure hypercholesterolemia, unspecified: Secondary | ICD-10-CM

## 2014-06-01 DIAGNOSIS — E559 Vitamin D deficiency, unspecified: Secondary | ICD-10-CM

## 2014-06-01 DIAGNOSIS — E1122 Type 2 diabetes mellitus with diabetic chronic kidney disease: Secondary | ICD-10-CM

## 2014-06-01 DIAGNOSIS — J449 Chronic obstructive pulmonary disease, unspecified: Secondary | ICD-10-CM

## 2014-06-01 DIAGNOSIS — I10 Essential (primary) hypertension: Secondary | ICD-10-CM

## 2014-06-01 DIAGNOSIS — M76899 Other specified enthesopathies of unspecified lower limb, excluding foot: Secondary | ICD-10-CM

## 2014-06-01 DIAGNOSIS — Z79899 Other long term (current) drug therapy: Secondary | ICD-10-CM | POA: Diagnosis not present

## 2014-06-01 DIAGNOSIS — E1129 Type 2 diabetes mellitus with other diabetic kidney complication: Secondary | ICD-10-CM | POA: Diagnosis not present

## 2014-06-01 LAB — CBC WITH DIFFERENTIAL/PLATELET
Basophils Absolute: 0 10*3/uL (ref 0.0–0.1)
Basophils Relative: 0 % (ref 0–1)
EOS ABS: 0.1 10*3/uL (ref 0.0–0.7)
Eosinophils Relative: 2 % (ref 0–5)
HCT: 40.4 % (ref 36.0–46.0)
Hemoglobin: 13.2 g/dL (ref 12.0–15.0)
Lymphocytes Relative: 32 % (ref 12–46)
Lymphs Abs: 1.5 10*3/uL (ref 0.7–4.0)
MCH: 29.4 pg (ref 26.0–34.0)
MCHC: 32.7 g/dL (ref 30.0–36.0)
MCV: 90 fL (ref 78.0–100.0)
MONO ABS: 0.3 10*3/uL (ref 0.1–1.0)
MPV: 9.3 fL (ref 8.6–12.4)
Monocytes Relative: 7 % (ref 3–12)
Neutro Abs: 2.8 10*3/uL (ref 1.7–7.7)
Neutrophils Relative %: 59 % (ref 43–77)
PLATELETS: 258 10*3/uL (ref 150–400)
RBC: 4.49 MIL/uL (ref 3.87–5.11)
RDW: 14.7 % (ref 11.5–15.5)
WBC: 4.8 10*3/uL (ref 4.0–10.5)

## 2014-06-01 MED ORDER — PREDNISONE 20 MG PO TABS: ORAL_TABLET | ORAL | Status: DC

## 2014-06-01 NOTE — Patient Instructions (Addendum)
Recommend the book "The END of DIETING" by Dr Excell Seltzer   & the book "The END of DIABETES " by Dr Excell Seltzer  At [email or web address] - get book & Audio CD's      Being diabetic has a  300% increased risk for heart attack, stroke, cancer, and alzheimer- type vascular dementia. It is very important that you work harder with diet by avoiding all foods that are white. Avoid white rice (brown & wild rice is OK), white potatoes (sweetpotatoes in moderation is OK), White bread or wheat bread or anything made out of white flour like bagels, donuts, rolls, buns, biscuits, cakes, pastries, cookies, pizza crust, and pasta (made from white flour & egg whites) - vegetarian pasta or spinach or wheat pasta is OK. Multigrain breads like Arnold's or [employer], or multigrain sandwich thins or flatbreads.  Diet, exercise and weight loss can reverse and cure diabetes in the early stages.  Diet, exercise and weight loss is very important in the control and prevention of complications of diabetes which affects every system in your body, ie. Brain - dementia/stroke, eyes - glaucoma/blindness, heart - heart attack/heart failure, kidneys - dialysis, stomach - gastric paralysis, intestines - malabsorption, nerves - severe painful neuritis, circulation - gangrene & loss of a leg(s), and finally cancer and Alzheimers.    I recommend avoid fried & greasy foods,  sweets/candy, white rice (brown or wild rice or Quinoa is OK), white potatoes (sweet potatoes are OK) - anything made from white flour - bagels, doughnuts, rolls, buns, biscuits,white and wheat breads, pizza crust and traditional pasta made of white flour & egg white(vegetarian pasta or spinach or wheat pasta is OK).  Multi-grain bread is OK - like multi-grain flat bread or sandwich thins. Avoid alcohol in excess. Exercise is also important.    Eat all the vegetables you want - avoid meat, especially red meat and dairy - especially cheese.  Cheese is the most  concentrated form of trans-fats which is the worst thing to clog up our arteries. Veggie cheese is OK which can be found in the fresh produce section at [employer] or [employer] or [employer]     Bad carbs also include fruit juice, alcohol, and sweet tea. These are empty calories that do not signal to your brain that you are full.   Please remember the good carbs are still carbs which convert into sugar. So please measure them out no more than 1/2-1 cup of rice, oatmeal, pasta, and beans  Veggies are however free foods! Pile them on.   Not all fruit is created equal. Please see the list below, the fruit at the bottom is higher in sugars than the fruit at the top. Please avoid all dried fruits.     Tendinitis  Tendinitis is redness, soreness, and puffiness (inflammation) of the tendons. Tendons are band-like tissues that connect muscle to bone. Tendinitis often happens in the shoulders, heels, or elbows. It might happen if your job involves doing the same motions over and over. HOME CARE  Use a sling or splint as told by your doctor.  Put ice on the injured area.  Put ice in a plastic bag.  Place a towel between your skin and the bag.  Leave the ice on for 15-20 minutes, 03-04 times a day.  Avoid using your injured arm or leg until the pain goes away.  Do gentle exercises only as told by your doctor. Stop exercises if the pain gets worse, unless your  doctor tells you otherwise.  Only take medicines as told by your doctor. GET HELP RIGHT AWAY IF:  Your pain and puffiness get worse.  You have new problems, such as loss of feeling (numbness) in the hands. MAKE SURE YOU:  Understand these instructions.  Will watch your condition.  Will get help right away if you are not doing well or get worse. Document Released: 06/14/2010 Document Revised: 05/27/2011 Document Reviewed: 06/14/2010 [facility] Patient Information 2015 [place], [place]. This information is not intended to  replace advice given to you by your health care provider. Make sure you discuss any questions you have with your health care provider.

## 2014-06-01 NOTE — Progress Notes (Signed)
Patient ID: Sara [name], female   DOB: March 12, 1946, 69 y.o.   MRN: [id number]  Assessment and Plan:   1. Essential hypertension -stop lotensin - TSH  2. Type 2 diabetes mellitus with diabetic chronic kidney disease -cont metformin - Hemoglobin A1c - Insulin, fasting  3. Hyperlipidemia -cont diet and exercise - Lipid panel  4. Vitamin D deficiency -cont supp - Vit D  25 hydroxy (rtn osteoporosis monitoring)  5. Medication management  - CBC with Differential/Platelet - BASIC METABOLIC PANEL WITH GFR - Hepatic function panel - Magnesium  6. Chronic obstructive pulmonary disease, unspecified COPD, unspecified chronic bronchitis type -maintain treatment -let us know if shortness of breath, coughing, or sputum production increases  7. Hip Adductor Tendonitis -prednisone course -ease into exercise   Continue diet and meds as discussed. Further disposition pending results of labs. Discussed med's effects and SE's.    HPI 69 y.o. female  presents for 3 month follow up with hypertension, hyperlipidemia, diabetes and vitamin D deficiency.   Her blood pressure has not been controlled at home, She reports that she had an episode of dizziness and low blood pressure.  She stopped taking her medications.  Today their BP is BP: 98/60 mmHg.She does workout. She denies chest pain, shortness of breath, dizziness.   She is not on cholesterol medication and denies myalgias. Her cholesterol is at goal. The cholesterol was:  02/22/2014: Cholesterol, Total 137; HDL-C 45; LDL (calc) 78; Triglycerides 71   She has been working on diet and exercise for diabetes with diabetic chronic kidney disease, she is on bASA, she is not on ACE/ARB, and denies  foot ulcerations, hyperglycemia, hypoglycemia , increased appetite, nausea, paresthesia of the feet, polydipsia, polyuria, visual disturbances, vomiting and weight loss. Last A1C was: 02/22/2014: Hemoglobin-A1c 6.5*   Patient is on Vitamin D  supplement. 02/22/2014: VITD 49  Patient reports that she has been seeing Dr. Jacelyn Grip for her back pain.  She was getting steroid injections and was supposed to get a nerve ablation, but her workers compensation was not covering it.  Patient reports that she was trying to walk but developed a burning sensation in her lower abdomen.  She is signed up to do water aerobics.  She is also going to try to start walking in her neighbor.    Current Medications:  Current Outpatient Prescriptions on File Prior to Visit  Medication Sig Dispense Refill  . acetaminophen (TYLENOL ARTHRITIS PAIN) 650 MG CR tablet Take 1,300 mg by mouth every 8 (eight) hours as needed. For pain    . albuterol (PROVENTIL HFA;VENTOLIN HFA) 108 (90 BASE) MCG/ACT inhaler Inhale 1-2 puffs into the lungs every 6 (six) hours as needed for wheezing or shortness of breath (or persistent coughing). 1 Inhaler 0  . alendronate (FOSAMAX) 70 MG tablet Take 1 tablet (70 mg total) by mouth once a week. Take with a full glass of water on an empty stomach. 12 tablet 3  . Ascorbic Acid (VITAMIN C PO) Take 1 tablet by mouth daily.    Marland Kitchen aspirin EC 81 MG tablet Take 81 mg by mouth daily.    Marland Kitchen atorvastatin (LIPITOR) 40 MG tablet Take 1 tablet (40 mg total) by mouth daily. 90 tablet 1  . betaxolol (BETOPTIC-S) 0.25 % ophthalmic suspension Place 1 drop into both eyes 2 (two) times daily.    . Blood Glucose Monitoring Suppl (ACCU-CHEK AVIVA PLUS) W/DEVICE KIT Please supply one meter and kit with supplies 1 kit 0  . Brinzolamide-Brimonidine [facility])  1-0.2 % SUSP Apply to eye. 2 drops each daily    . CALCIUM PO Take 500 mg by mouth daily.     Marland Kitchen gabapentin (NEURONTIN) 100 MG capsule Take 1 capsule (100 mg total) by mouth 3 (three) times daily. 270 capsule 1  . glucose blood (ACCU-CHEK AVIVA PLUS) test strip Check blood glucose 1 time daily-[id number] 100 each 12  . HYDROcodone-acetaminophen (NORCO/VICODIN) 5-325 MG per tablet Take 0.5 tablets by mouth 2 (two)  times daily as needed for moderate pain.    Marland Kitchen LORazepam (ATIVAN) 2 MG tablet TAKE 1/2 TO 1 TABLET THREE TIMES DAILY AS NEEDED FOR ANXIETY 270 tablet 1  . metFORMIN (GLUCOPHAGE-XR) 500 MG 24 hr tablet Take 1 tablet (500 mg total) by mouth at bedtime. 90 tablet 3  . Multiple Vitamin (MULTIVITAMIN WITH MINERALS) TABS tablet Take 1 tablet by mouth daily.    . ranitidine (ZANTAC) 300 MG tablet Take 1 tablet (300 mg total) by mouth at bedtime. 90 tablet 3  . traMADol (ULTRAM) 50 MG tablet Take 50 mg by mouth 2 (two) times daily.    Marland Kitchen VITAMIN E PO Take by mouth daily.    . benazepril-hydrochlorthiazide (LOTENSIN HCT) 20-25 MG per tablet Take 1 tablet by mouth daily. (Patient not taking: Reported on 06/01/2014) 90 tablet 1   No current facility-administered medications on file prior to visit.   Medical History:  Past Medical History  Diagnosis Date  . Sarcoidosis >61yr ago    no problems since  . DeQuervain's disease (tenosynovitis)   . Diverticulitis   . Bronchitis     hx of;>89yrago  . Headache(784.0)     related neck issues  . Dizziness     related to neck issues  . Chronic neck pain     arthritis  . History of shingles 2012  . Insomnia     d/t pain and takes Ativan nightly  . GERD (gastroesophageal reflux disease)     takes Ranitidine nightly  . Glaucoma   . High cholesterol     takes Lipitor daily  . Hypertension     takes Lotensin HCT daily  . COPD (chronic obstructive pulmonary disease)   . Asthma   . Degenerative disc disease     neck  . Frozen shoulder September 2012  . Hay fever   . Type II or unspecified type diabetes mellitus without mention of complication, not stated as uncontrolled     takes Metformin nightly  . Osteoporosis    Allergies:  Allergies  Allergen Reactions  . Penicillins Shortness Of Breath    And hives  . Aspirin Other (See Comments)    Stomach upset, but low dose is ok  . Sulfa Antibiotics Other (See Comments)    unknown  . Codeine Rash      Review of Systems:  Review of Systems  Constitutional: Negative for fever, chills and malaise/fatigue.  HENT: Negative for congestion, ear discharge, ear pain, nosebleeds, sore throat and tinnitus.   Eyes: Negative.   Respiratory: Negative for cough, shortness of breath and wheezing.   Cardiovascular: Negative for chest pain, palpitations and leg swelling.  Gastrointestinal: Negative for nausea, vomiting, diarrhea, constipation, blood in stool and melena.  Genitourinary: Negative for dysuria, urgency, frequency and hematuria.  Musculoskeletal: Positive for myalgias, back pain and joint pain. Negative for falls.  Neurological: Negative for weakness and headaches.    Family history- Review and unchanged  Social history- Review and unchanged  Physical Exam: BP 98/60 mmHg  Pulse 80  Temp(Src) 98.2 F (36.8 C) (Temporal)  Resp 16  Ht 5' 5" (1.651 m)  Wt 150 lb (68.04 kg)  BMI 24.96 kg/m2  SpO2 98% Wt Readings from Last 3 Encounters:  06/01/14 150 lb (68.04 kg)  02/22/14 150 lb 9.6 oz (68.312 kg)  11/16/13 148 lb (67.132 kg)   General Appearance: Well nourished well developed, non-toxic appearing, in no apparent distress. Eyes: PERRLA, EOMs, conjunctiva no swelling or erythema ENT/Mouth: Ear canals clear with no erythema, swelling, or discharge.  TMs normal bilaterally, oropharynx clear, moist, with no exudate.   Neck: Supple, thyroid normal, no JVD, no cervical adenopathy.  Respiratory: Respiratory effort normal, breath sounds clear A&P, no wheeze, rhonchi or rales noted.  No retractions, no accessory muscle usage Cardio: RRR with no MRGs. No noted edema.  Abdomen: Soft, + BS.  Non tender, no guarding, rebound, hernias, masses. Musculoskeletal: Full ROM, 5/5 strength, Normal gait.  Patient does have pain over the adductor tendons.  She has some pain with adduction against resistence bilaterally.  There is no evidence of irritability with internal and external rotation of the  hip.  Sensation to light touch intact. Skin: Warm, dry without rashes, lesions, ecchymosis.  Neuro: Awake and oriented X 3, Cranial nerves intact. No cerebellar symptoms.  Psych: normal affect, Insight and Judgment appropriate.    FORCUCCI, COURTNEY, PA-C 9:53 AM [facility]

## 2014-06-02 LAB — HEPATIC FUNCTION PANEL
ALK PHOS: 41 U/L (ref 39–117)
ALT: 14 U/L (ref 0–35)
AST: 17 U/L (ref 0–37)
Albumin: 4.7 g/dL (ref 3.5–5.2)
BILIRUBIN DIRECT: 0.1 mg/dL (ref 0.0–0.3)
BILIRUBIN INDIRECT: 0.5 mg/dL (ref 0.2–1.2)
Total Bilirubin: 0.6 mg/dL (ref 0.2–1.2)
Total Protein: 7 g/dL (ref 6.0–8.3)

## 2014-06-02 LAB — HEMOGLOBIN A1C
Hgb A1c MFr Bld: 6.1 % — ABNORMAL HIGH (ref <5.7)
MEAN PLASMA GLUCOSE: 128 mg/dL — AB (ref <117)

## 2014-06-02 LAB — BASIC METABOLIC PANEL WITH GFR
BUN: 12 mg/dL (ref 6–23)
CO2: 26 mEq/L (ref 19–32)
CREATININE: 0.92 mg/dL (ref 0.50–1.10)
Calcium: 10.2 mg/dL (ref 8.4–10.5)
Chloride: 106 mEq/L (ref 96–112)
GFR, EST NON AFRICAN AMERICAN: 65 mL/min
GFR, Est African American: 75 mL/min
Glucose, Bld: 105 mg/dL — ABNORMAL HIGH (ref 70–99)
Potassium: 4.7 mEq/L (ref 3.5–5.3)
Sodium: 140 mEq/L (ref 135–145)

## 2014-06-02 LAB — LIPID PANEL
CHOL/HDL RATIO: 2.9 ratio
CHOLESTEROL: 183 mg/dL (ref 0–200)
HDL: 64 mg/dL (ref >=46)
LDL CALC: 105 mg/dL — AB (ref 0–99)
Triglycerides: 70 mg/dL (ref <150)
VLDL: 14 mg/dL (ref 0–40)

## 2014-06-02 LAB — TSH: TSH: 1.347 u[IU]/mL (ref 0.350–4.500)

## 2014-06-02 LAB — VITAMIN D 25 HYDROXY (VIT D DEFICIENCY, FRACTURES): Vit D, 25-Hydroxy: 42 ng/mL (ref 30–100)

## 2014-06-02 LAB — MAGNESIUM: MAGNESIUM: 2.4 mg/dL (ref 1.5–2.5)

## 2014-06-02 LAB — INSULIN, FASTING: INSULIN FASTING, SERUM: 9.9 u[IU]/mL (ref 2.0–19.6)

## 2014-06-14 ENCOUNTER — Other Ambulatory Visit: Payer: Self-pay | Admitting: Emergency Medicine

## 2014-07-18 ENCOUNTER — Telehealth: Payer: Self-pay | Admitting: *Deleted

## 2014-07-18 MED ORDER — TIZANIDINE HCL 4 MG PO TABS: 4.0000 mg | ORAL_TABLET | Freq: Four times a day (QID) | ORAL | Status: DC | PRN

## 2014-07-18 NOTE — Telephone Encounter (Signed)
Patient called and requested a muscle relaxer for low back pain.  OK to send in RX for Zanaflex.  Patient will try med and let us know if she improves.

## 2014-07-22 ENCOUNTER — Encounter: Payer: Self-pay | Admitting: Internal Medicine

## 2014-07-22 ENCOUNTER — Other Ambulatory Visit: Payer: Self-pay | Admitting: Internal Medicine

## 2014-07-22 DIAGNOSIS — M549 Dorsalgia, unspecified: Secondary | ICD-10-CM

## 2014-07-25 ENCOUNTER — Other Ambulatory Visit: Payer: Self-pay | Admitting: Internal Medicine

## 2014-07-25 ENCOUNTER — Telehealth: Payer: Self-pay | Admitting: *Deleted

## 2014-07-25 DIAGNOSIS — M545 Low back pain: Secondary | ICD-10-CM

## 2014-07-25 MED ORDER — BACLOFEN 10 MG PO TABS: ORAL_TABLET | ORAL | Status: AC

## 2014-07-25 NOTE — Telephone Encounter (Signed)
Patient called and states she cannot take the Tizanidine and requested a different RX.  Patient advised that Dr Melford Aase sent in an RX for Baclofen.

## 2014-08-02 DIAGNOSIS — S93491A Sprain of other ligament of right ankle, initial encounter: Secondary | ICD-10-CM | POA: Diagnosis not present

## 2014-08-26 ENCOUNTER — Ambulatory Visit: Payer: Self-pay | Admitting: Internal Medicine

## 2014-09-05 ENCOUNTER — Other Ambulatory Visit: Payer: Self-pay | Admitting: *Deleted

## 2014-09-06 DIAGNOSIS — S93491D Sprain of other ligament of right ankle, subsequent encounter: Secondary | ICD-10-CM | POA: Diagnosis not present

## 2014-09-06 DIAGNOSIS — M25572 Pain in left ankle and joints of left foot: Secondary | ICD-10-CM | POA: Diagnosis not present

## 2014-09-07 ENCOUNTER — Other Ambulatory Visit: Payer: Self-pay | Admitting: *Deleted

## 2014-09-07 MED ORDER — BACLOFEN 10 MG PO TABS: ORAL_TABLET | ORAL | Status: DC

## 2014-09-12 ENCOUNTER — Other Ambulatory Visit: Payer: Self-pay

## 2014-09-16 ENCOUNTER — Ambulatory Visit: Payer: Self-pay | Admitting: Internal Medicine

## 2014-09-23 ENCOUNTER — Ambulatory Visit: Payer: Self-pay | Admitting: Internal Medicine

## 2014-09-23 ENCOUNTER — Encounter: Payer: Self-pay | Admitting: Internal Medicine

## 2014-09-23 ENCOUNTER — Ambulatory Visit (INDEPENDENT_AMBULATORY_CARE_PROVIDER_SITE_OTHER): Payer: Commercial Managed Care - HMO | Admitting: Internal Medicine

## 2014-09-23 VITALS — BP 104/70 | HR 66 | Temp 98.2°F | Resp 16 | Ht 65.5 in | Wt 157.0 lb

## 2014-09-23 DIAGNOSIS — E7800 Pure hypercholesterolemia, unspecified: Secondary | ICD-10-CM

## 2014-09-23 DIAGNOSIS — N6019 Diffuse cystic mastopathy of unspecified breast: Secondary | ICD-10-CM

## 2014-09-23 DIAGNOSIS — E1122 Type 2 diabetes mellitus with diabetic chronic kidney disease: Secondary | ICD-10-CM | POA: Diagnosis not present

## 2014-09-23 DIAGNOSIS — I10 Essential (primary) hypertension: Secondary | ICD-10-CM

## 2014-09-23 DIAGNOSIS — E559 Vitamin D deficiency, unspecified: Secondary | ICD-10-CM

## 2014-09-23 DIAGNOSIS — Z1331 Encounter for screening for depression: Secondary | ICD-10-CM

## 2014-09-23 DIAGNOSIS — D539 Nutritional anemia, unspecified: Secondary | ICD-10-CM

## 2014-09-23 DIAGNOSIS — Z79899 Other long term (current) drug therapy: Secondary | ICD-10-CM

## 2014-09-23 DIAGNOSIS — M47816 Spondylosis without myelopathy or radiculopathy, lumbar region: Secondary | ICD-10-CM

## 2014-09-23 DIAGNOSIS — Z1212 Encounter for screening for malignant neoplasm of rectum: Secondary | ICD-10-CM

## 2014-09-23 DIAGNOSIS — N182 Chronic kidney disease, stage 2 (mild): Secondary | ICD-10-CM

## 2014-09-23 DIAGNOSIS — Z9181 History of falling: Secondary | ICD-10-CM

## 2014-09-23 DIAGNOSIS — N189 Chronic kidney disease, unspecified: Secondary | ICD-10-CM | POA: Diagnosis not present

## 2014-09-23 DIAGNOSIS — E1129 Type 2 diabetes mellitus with other diabetic kidney complication: Secondary | ICD-10-CM | POA: Diagnosis not present

## 2014-09-23 LAB — CBC WITH DIFFERENTIAL/PLATELET
BASOS PCT: 1 % (ref 0–1)
Basophils Absolute: 0 10*3/uL (ref 0.0–0.1)
Eosinophils Absolute: 0.1 10*3/uL (ref 0.0–0.7)
Eosinophils Relative: 2 % (ref 0–5)
HCT: 39.2 % (ref 36.0–46.0)
Hemoglobin: 13 g/dL (ref 12.0–15.0)
Lymphocytes Relative: 36 % (ref 12–46)
Lymphs Abs: 1.4 10*3/uL (ref 0.7–4.0)
MCH: 29.7 pg (ref 26.0–34.0)
MCHC: 33.2 g/dL (ref 30.0–36.0)
MCV: 89.7 fL (ref 78.0–100.0)
MONO ABS: 0.3 10*3/uL (ref 0.1–1.0)
MPV: 9.5 fL (ref 8.6–12.4)
Monocytes Relative: 8 % (ref 3–12)
NEUTROS ABS: 2.1 10*3/uL (ref 1.7–7.7)
Neutrophils Relative %: 53 % (ref 43–77)
Platelets: 235 10*3/uL (ref 150–400)
RBC: 4.37 MIL/uL (ref 3.87–5.11)
RDW: 14 % (ref 11.5–15.5)
WBC: 4 10*3/uL (ref 4.0–10.5)

## 2014-09-23 LAB — BASIC METABOLIC PANEL WITH GFR
BUN: 13 mg/dL (ref 6–23)
CHLORIDE: 101 meq/L (ref 96–112)
CO2: 30 mEq/L (ref 19–32)
CREATININE: 0.87 mg/dL (ref 0.50–1.10)
Calcium: 9.5 mg/dL (ref 8.4–10.5)
GFR, EST AFRICAN AMERICAN: 80 mL/min
GFR, EST NON AFRICAN AMERICAN: 70 mL/min
Glucose, Bld: 96 mg/dL (ref 70–99)
Potassium: 4.1 mEq/L (ref 3.5–5.3)
Sodium: 139 mEq/L (ref 135–145)

## 2014-09-23 LAB — HEMOGLOBIN A1C
Hgb A1c MFr Bld: 6.4 % — ABNORMAL HIGH (ref <5.7)
Mean Plasma Glucose: 137 mg/dL — ABNORMAL HIGH (ref <117)

## 2014-09-23 LAB — VITAMIN B12: VITAMIN B 12: 1007 pg/mL — AB (ref 211–911)

## 2014-09-23 LAB — TSH: TSH: 1.296 u[IU]/mL (ref 0.350–4.500)

## 2014-09-23 LAB — HEPATIC FUNCTION PANEL
ALBUMIN: 4.2 g/dL (ref 3.5–5.2)
ALK PHOS: 47 U/L (ref 39–117)
ALT: 17 U/L (ref 0–35)
AST: 20 U/L (ref 0–37)
BILIRUBIN TOTAL: 0.6 mg/dL (ref 0.2–1.2)
Bilirubin, Direct: 0.1 mg/dL (ref 0.0–0.3)
Indirect Bilirubin: 0.5 mg/dL (ref 0.2–1.2)
Total Protein: 6.5 g/dL (ref 6.0–8.3)

## 2014-09-23 LAB — LIPID PANEL
CHOLESTEROL: 149 mg/dL (ref 0–200)
HDL: 65 mg/dL (ref >=46)
LDL Cholesterol: 72 mg/dL (ref 0–99)
TRIGLYCERIDES: 61 mg/dL (ref <150)
Total CHOL/HDL Ratio: 2.3 Ratio
VLDL: 12 mg/dL (ref 0–40)

## 2014-09-23 LAB — MAGNESIUM: MAGNESIUM: 2 mg/dL (ref 1.5–2.5)

## 2014-09-23 LAB — IRON AND TIBC
%SAT: 36 % (ref 20–55)
IRON: 116 ug/dL (ref 42–145)
TIBC: 319 ug/dL (ref 250–470)
UIBC: 203 ug/dL (ref 125–400)

## 2014-09-23 NOTE — Patient Instructions (Signed)
Preventive Care for Adults  A healthy lifestyle and preventive care can promote health and wellness. Preventive health guidelines for women include the following key practices.  A routine yearly physical is a good way to check with your health care provider about your health and preventive screening. It is a chance to share any concerns and updates on your health and to receive a thorough exam.  Visit your dentist for a routine exam and preventive care every 6 months. Brush your teeth twice a day and floss once a day. Good oral hygiene prevents tooth decay and gum disease.  The frequency of eye exams is based on your age, health, family medical history, use of contact lenses, and other factors. Follow your health care provider's recommendations for frequency of eye exams.  Eat a healthy diet. Foods like vegetables, fruits, whole grains, low-fat dairy products, and lean protein foods contain the nutrients you need without too many calories. Decrease your intake of foods high in solid fats, added sugars, and salt. Eat the right amount of calories for you.Get information about a proper diet from your health care provider, if necessary.  Regular physical exercise is one of the most important things you can do for your health. Most adults should get at least 150 minutes of moderate-intensity exercise (any activity that increases your heart rate and causes you to sweat) each week. In addition, most adults need muscle-strengthening exercises on 2 or more days a week.  Maintain a healthy weight. The body mass index (BMI) is a screening tool to identify possible weight problems. It provides an estimate of body fat based on height and weight. Your health care provider can find your BMI and can help you achieve or maintain a healthy weight.For adults 20 years and older:  A BMI below 18.5 is considered underweight.  A BMI of 18.5 to 24.9 is normal.  A BMI of 25 to 29.9 is considered overweight.  A BMI  of 30 and above is considered obese.  Maintain normal blood lipids and cholesterol levels by exercising and minimizing your intake of saturated fat. Eat a balanced diet with plenty of fruit and vegetables. If your lipid or cholesterol levels are high, you are over 50, or you are at high risk for heart disease, you may need your cholesterol levels checked more frequently.Ongoing high lipid and cholesterol levels should be treated with medicines if diet and exercise are not working.  If you smoke, find out from your health care provider how to quit. If you do not use tobacco, do not start.  Lung cancer screening is recommended for adults aged 2-80 years who are at high risk for developing lung cancer because of a history of smoking. A yearly low-dose CT scan of the lungs is recommended for people who have at least a 30-pack-year history of smoking and are a current smoker or have quit within the past 15 years. A pack year of smoking is smoking an average of 1 pack of cigarettes a day for 1 year (for example: 1 pack a day for 30 years or 2 packs a day for 15 years). Yearly screening should continue until the smoker has stopped smoking for at least 15 years. Yearly screening should be stopped for people who develop a health problem that would prevent them from having lung cancer treatment.  Avoid use of street drugs. Do not share needles with anyone. Ask for help if you need support or instructions about stopping the use of drugs.  High  blood pressure causes heart disease and increases the risk of stroke.  Ongoing high blood pressure should be treated with medicines if weight loss and exercise do not work.  If you are 36-32 years old, ask your health care provider if you should take aspirin to prevent strokes.  Diabetes screening involves taking a blood sample to check your fasting blood sugar level. This should be done once every 3 years, after age 29, if you are within normal weight and without risk  factors for diabetes. Testing should be considered at a younger age or be carried out more frequently if you are overweight and have at least 1 risk factor for diabetes.  Breast cancer screening is essential preventive care for women. You should practice "breast self-awareness." This means understanding the normal appearance and feel of your breasts and may include breast self-examination. Any changes detected, no matter how small, should be reported to a health care provider. Women in their 68s and 30s should have a clinical breast exam (CBE) by a health care provider as part of a regular health exam every 1 to 3 years. After age 61, women should have a CBE every year. Starting at age 83, women should consider having a mammogram (breast X-ray test) every year. Women who have a family history of breast cancer should talk to their health care provider about genetic screening. Women at a high risk of breast cancer should talk to their health care providers about having an MRI and a mammogram every year.  Breast cancer gene (BRCA)-related cancer risk assessment is recommended for women who have family members with BRCA-related cancers. BRCA-related cancers include breast, ovarian, tubal, and peritoneal cancers. Having family members with these cancers may be associated with an increased risk for harmful changes (mutations) in the breast cancer genes BRCA1 and BRCA2. Results of the assessment will determine the need for genetic counseling and BRCA1 and BRCA2 testing.  Routine pelvic exams to screen for cancer are no longer recommended for nonpregnant women who are considered low risk for cancer of the pelvic organs (ovaries, uterus, and vagina) and who do not have symptoms. Ask your health care provider if a screening pelvic exam is right for you.  If you have had past treatment for cervical cancer or a condition that could lead to cancer, you need Pap tests and screening for cancer for at least 20 years after  your treatment. If Pap tests have been discontinued, your risk factors (such as having a new sexual partner) need to be reassessed to determine if screening should be resumed. Some women have medical problems that increase the chance of getting cervical cancer. In these cases, your health care provider may recommend more frequent screening and Pap tests.    Colorectal cancer can be detected and often prevented. Most routine colorectal cancer screening begins at the age of 62 years and continues through age 27 years. However, your health care provider may recommend screening at an earlier age if you have risk factors for colon cancer. On a yearly basis, your health care provider may provide home test kits to check for hidden blood in the stool. Use of a small camera at the end of a tube, to directly examine the colon (sigmoidoscopy or colonoscopy), can detect the earliest forms of colorectal cancer. Talk to your health care provider about this at age 40, when routine screening begins. Direct exam of the colon should be repeated every 5-10 years through age 32 years, unless early forms of pre-cancerous  polyps or small growths are found.  Osteoporosis is a disease in which the bones lose minerals and strength with aging. This can result in serious bone fractures or breaks. The risk of osteoporosis can be identified using a bone density scan. Women ages 68 years and over and women at risk for fractures or osteoporosis should discuss screening with their health care providers. Ask your health care provider whether you should take a calcium supplement or vitamin D to reduce the rate of osteoporosis.  Menopause can be associated with physical symptoms and risks. Hormone replacement therapy is available to decrease symptoms and risks. You should talk to your health care provider about whether hormone replacement therapy is right for you.  Use sunscreen. Apply sunscreen liberally and repeatedly throughout the day.  You should seek shade when your shadow is shorter than you. Protect yourself by wearing long sleeves, pants, a wide-brimmed hat, and sunglasses year round, whenever you are outdoors.  Once a month, do a whole body skin exam, using a mirror to look at the skin on your back. Tell your health care provider of new moles, moles that have irregular borders, moles that are larger than a pencil eraser, or moles that have changed in shape or color.  Stay current with required vaccines (immunizations).  Influenza vaccine. All adults should be immunized every year.  Tetanus, diphtheria, and acellular pertussis (Td, Tdap) vaccine. Pregnant women should receive 1 dose of Tdap vaccine during each pregnancy. The dose should be obtained regardless of the length of time since the last dose. Immunization is preferred during the 27th-36th week of gestation. An adult who has not previously received Tdap or who does not know her vaccine status should receive 1 dose of Tdap. This initial dose should be followed by tetanus and diphtheria toxoids (Td) booster doses every 10 years. Adults with an unknown or incomplete history of completing a 3-dose immunization series with Td-containing vaccines should begin or complete a primary immunization series including a Tdap dose. Adults should receive a Td booster every 10 years.    Zoster vaccine. One dose is recommended for adults aged 36 years or older unless certain conditions are present.    Pneumococcal 13-valent conjugate (PCV13) vaccine. When indicated, a person who is uncertain of her immunization history and has no record of immunization should receive the PCV13 vaccine. An adult aged 1 years or older who has certain medical conditions and has not been previously immunized should receive 1 dose of PCV13 vaccine. This PCV13 should be followed with a dose of pneumococcal polysaccharide (PPSV23) vaccine. The PPSV23 vaccine dose should be obtained at least 8 weeks after the  dose of PCV13 vaccine. An adult aged 73 years or older who has certain medical conditions and previously received 1 or more doses of PPSV23 vaccine should receive 1 dose of PCV13. The PCV13 vaccine dose should be obtained 1 or more years after the last PPSV23 vaccine dose.    Pneumococcal polysaccharide (PPSV23) vaccine. When PCV13 is also indicated, PCV13 should be obtained first. All adults aged 47 years and older should be immunized. An adult younger than age 86 years who has certain medical conditions should be immunized. Any person who resides in a nursing home or long-term care facility should be immunized. An adult smoker should be immunized. People with an immunocompromised condition and certain other conditions should receive both PCV13 and PPSV23 vaccines. People with human immunodeficiency virus (HIV) infection should be immunized as soon as possible after diagnosis. Immunization  during chemotherapy or radiation therapy should be avoided. Routine use of PPSV23 vaccine is not recommended for American Indians, [place], or people younger than 65 years unless there are medical conditions that require PPSV23 vaccine. When indicated, people who have unknown immunization and have no record of immunization should receive PPSV23 vaccine. One-time revaccination 5 years after the first dose of PPSV23 is recommended for people aged 19-64 years who have chronic kidney failure, nephrotic syndrome, asplenia, or immunocompromised conditions. People who received 1-2 doses of PPSV23 before age 55 years should receive another dose of PPSV23 vaccine at age 84 years or later if at least 5 years have passed since the previous dose. Doses of PPSV23 are not needed for people immunized with PPSV23 at or after age 13 years.   Preventive Services / Frequency  Ages 54 years and over  Blood pressure check.  Lipid and cholesterol check.  Lung cancer screening. / Every year if you are aged 44-80 years and have a  30-pack-year history of smoking and currently smoke or have quit within the past 15 years. Yearly screening is stopped once you have quit smoking for at least 15 years or develop a health problem that would prevent you from having lung cancer treatment.  Clinical breast exam.** / Every year after age 46 years.  BRCA-related cancer risk assessment.** / For women who have family members with a BRCA-related cancer (breast, ovarian, tubal, or peritoneal cancers).  Mammogram.** / Every year beginning at age 77 years and continuing for as long as you are in good health. Consult with your health care provider.  Pap test.** / Every 3 years starting at age 44 years through age 33 or 61 years with 3 consecutive normal Pap tests. Testing can be stopped between 65 and 70 years with 3 consecutive normal Pap tests and no abnormal Pap or HPV tests in the past 10 years.  Fecal occult blood test (FOBT) of stool. / Every year beginning at age 50 years and continuing until age 58 years. You may not need to do this test if you get a colonoscopy every 10 years.  Flexible sigmoidoscopy or colonoscopy.** / Every 5 years for a flexible sigmoidoscopy or every 10 years for a colonoscopy beginning at age 74 years and continuing until age 27 years.  Hepatitis C blood test.** / For all people born from [birth year] through 1963 and any individual with known risks for hepatitis C.  Osteoporosis screening.** / A one-time screening for women ages 83 years and over and women at risk for fractures or osteoporosis.  Skin self-exam. / Monthly.  Influenza vaccine. / Every year.  Tetanus, diphtheria, and acellular pertussis (Tdap/Td) vaccine.** / 1 dose of Td every 10 years.  Zoster vaccine.** / 1 dose for adults aged 29 years or older.  Pneumococcal 13-valent conjugate (PCV13) vaccine.** / Consult your health care provider.  Pneumococcal polysaccharide (PPSV23) vaccine.** / 1 dose for all adults aged 70 years and older. Screening  for abdominal aortic aneurysm (AAA)  by ultrasound is recommended for people who have history of high blood pressure or who are current or former smokers.

## 2014-09-23 NOTE — Progress Notes (Signed)
Patient ID: Sara [name], female   DOB: 1945-08-02, 69 y.o.   MRN: [id number]  Complete Physical  Assessment and Plan:   1. Essential hypertension -d/c lotensin, take only if greater than 160/85 or greater -monitor at home -low sodium diet - Urinalysis, Routine w reflex microscopic (not at [facility]) - Microalbumin / creatinine urine ratio - EKG 12-Lead - [place], RETROPERITNL ABD,  LTD - TSH  2. Type 2 diabetes mellitus with diabetic chronic kidney disease -monitor at home -diet and exercise - Hemoglobin A1c - Insulin, random  3. Lumbar spondylosis, unspecified spinal osteoarthritis -followed by ortho -recommended continued exercises for core -increase walking  4. CKD (chronic kidney disease) stage 2, GFR 60-89 ml/min -BMET -Control DM -cont metformin  5. Hyperlipidemia -cont meds - Lipid panel  6. Vitamin D deficiency -cont supplement - Vit D  25 hydroxy (rtn osteoporosis monitoring)  7. Medication management  - CBC with Differential/Platelet - BASIC METABOLIC PANEL WITH GFR - Hepatic function panel - Magnesium  8. Deficiency anemia  - Iron and TIBC - Vitamin B12  9. Screening for rectal cancer  - POC Hemoccult Bld/Stl (3-Cd Home Screen); Future  10. Fibrocystic breast, unspecified laterality  - MM DIGITAL SCREENING BILATERAL; Future    Discussed med's effects and SE's. Screening labs and tests as requested with regular follow-up as recommended.  HPI  69 y.o. female  presents for a complete physical.  Her blood pressure has been controlled at home, today their BP is BP: 104/70 mmHg.  She does not workout. She denies chest pain, shortness of breath, dizziness. She reports that her blood pressure sometimes drops to 80/60.  She reports that she takes the pill when her blood pressure is over 140/80.  She reports that it has never gotten over 140.    She is on cholesterol medication and denies myalgias. Her cholesterol is not at goal. The cholesterol last  visit was:  Lab Results  Component Value Date   CHOL 183 06/01/2014   HDL 64 06/01/2014   LDLCALC 105* 06/01/2014   TRIG 70 06/01/2014   CHOLHDL 2.9 06/01/2014  .  She has not been working on diet and exercise for diabetes, she is on bASA, she is on ACE/ARB and denies foot ulcerations, hyperglycemia, hypoglycemia , increased appetite, nausea, paresthesia of the feet, polydipsia, polyuria, visual disturbances, vomiting and weight loss. Last A1C in the office was:  Lab Results  Component Value Date   HGBA1C 6.1* 06/01/2014  She has been eating a lot of tuna, fruit cups, and romaine lettuce.    Patient is on Vitamin D supplement.   Lab Results  Component Value Date   VD25OH 42 06/01/2014     Patient reports that she recently had an ankle fracture that was treated by Dr. Noemi Chapel.  She did not have to have surgery and that she was able to wear a boot.  She feels like the boot aggravated her lower abdomen and also her back.    Current Medications:  Current Outpatient Prescriptions on File Prior to Visit  Medication Sig Dispense Refill  . ACCU-CHEK SOFTCLIX LANCETS lancets CHECK BLOOD GLUCOSE EVERY DAY 100 each 0  . acetaminophen (TYLENOL ARTHRITIS PAIN) 650 MG CR tablet Take 1,300 mg by mouth every 8 (eight) hours as needed. For pain    . alendronate (FOSAMAX) 70 MG tablet Take 1 tablet (70 mg total) by mouth once a week. Take with a full glass of water on an empty stomach. 12 tablet 3  .  Ascorbic Acid (VITAMIN C PO) Take 1,000 mg by mouth daily.     Marland Kitchen aspirin EC 81 MG tablet Take 81 mg by mouth daily.    Marland Kitchen atorvastatin (LIPITOR) 40 MG tablet Take 1 tablet (40 mg total) by mouth daily. 90 tablet 1  . baclofen (LIORESAL) 10 MG tablet Take 1/2 to 1 tablet 2 to 3 times a day as needed for muscle spasm. 270 each 0  . benazepril-hydrochlorthiazide (LOTENSIN HCT) 20-25 MG per tablet Take 1 tablet by mouth daily. 90 tablet 1  . betaxolol (BETOPTIC-S) 0.25 % ophthalmic suspension Place 1 drop into  both eyes 2 (two) times daily.    . Blood Glucose Monitoring Suppl (ACCU-CHEK AVIVA PLUS) W/DEVICE KIT USE AS DIRECTED 1 kit 0  . Brinzolamide-Brimonidine (SIMBRINZA) 1-0.2 % SUSP Apply to eye. 2 drops each daily    . CALCIUM PO Take 500 mg by mouth daily.     Marland Kitchen gabapentin (NEURONTIN) 100 MG capsule Take 1 capsule (100 mg total) by mouth 3 (three) times daily. 270 capsule 1  . glucose blood (ACCU-CHEK AVIVA PLUS) test strip Check blood glucose 1 time daily-DX-E11.22 100 each 12  . KRILL OIL PO Take by mouth daily.    Marland Kitchen LORazepam (ATIVAN) 2 MG tablet TAKE 1/2 TO 1 TABLET THREE TIMES DAILY AS NEEDED FOR ANXIETY 270 tablet 1  . metFORMIN (GLUCOPHAGE-XR) 500 MG 24 hr tablet Take 1 tablet (500 mg total) by mouth at bedtime. 90 tablet 3  . Multiple Vitamin (MULTIVITAMIN WITH MINERALS) TABS tablet Take 1 tablet by mouth daily.    . ranitidine (ZANTAC) 300 MG tablet Take 1 tablet (300 mg total) by mouth at bedtime. 90 tablet 3  . traMADol (ULTRAM) 50 MG tablet Take 50 mg by mouth 2 (two) times daily.    Marland Kitchen VITAMIN E PO Take 1 capsule by mouth daily.      No current facility-administered medications on file prior to visit.    Health Maintenance:   Immunization History  Administered Date(s) Administered  . Pneumococcal Conjugate-13 11/16/2013  . Pneumococcal-Unspecified 03/18/2005  . Tdap 03/18/2009  . Zoster 11/13/2012   Pap: She has had complete hysterctomy MGM: ordered  DEXA: 07/31/2012 Colonoscopy: 06/17/2007 Last Dental Exam: Last Eye Exam:  Patient Care Team: Unk Pinto, MD as PCP - General (Internal Medicine) Normajean Glasgow, MD as Attending Physician (Physical Medicine and Rehabilitation) Phylliss Bob, MD as Consulting Physician (Orthopedic Surgery) Juanita Craver, MD as Consulting Physician (Gastroenterology)  Allergies:  Allergies  Allergen Reactions  . Penicillins Shortness Of Breath    And hives  . Aspirin Other (See Comments)    Stomach upset, but low dose is ok  . Sulfa  Antibiotics Other (See Comments)    unknown  . Codeine Rash    Medical History:  Past Medical History  Diagnosis Date  . Sarcoidosis >73yr ago    no problems since  . DeQuervain's disease (tenosynovitis)   . Diverticulitis   . Bronchitis     hx of;>89yrago  . Headache(784.0)     related neck issues  . Dizziness     related to neck issues  . Chronic neck pain     arthritis  . History of shingles 2012  . Insomnia     d/t pain and takes Ativan nightly  . GERD (gastroesophageal reflux disease)     takes Ranitidine nightly  . Glaucoma   . High cholesterol     takes Lipitor daily  . Hypertension  takes Lotensin HCT daily  . COPD (chronic obstructive pulmonary disease)   . Asthma   . Degenerative disc disease     neck  . Frozen shoulder September 2012  . Hay fever   . Type II or unspecified type diabetes mellitus without mention of complication, not stated as uncontrolled     takes Metformin nightly  . Osteoporosis     Surgical History:  Past Surgical History  Procedure Laterality Date  . Carpal tunnel release  2012  . Wrist surgery      ORIF left wrist and then removed d/t protruding screw in 2012  . Breast surgery  20+yrs ago    left breast;benign  . Tonsillectomy  30+yrs ago  . Abdominal hysterectomy  1975  . Refractive surgery  2011    bilateral  . Colonoscopy    . Anterior cervical decomp/discectomy fusion  07/31/2011    Procedure: ANTERIOR CERVICAL DECOMPRESSION/DISCECTOMY FUSION 3 LEVELS;  Surgeon: Hosie Spangle, MD;  Location: [facility];  Service: Neurosurgery;  Laterality: N/A;  Cervical four-five,Cervical five-six,Cervical six-seven anterior cervical decompression with fusion plating and bonegraft    Family History:  Family History  Problem Relation Age of Onset  . Cancer Mother 44    breast cancer  . Pneumonia Father   . Leukemia Father   . Glaucoma Father   . Anesthesia problems Neg Hx   . Hypotension Neg Hx   . Malignant  hyperthermia Neg Hx   . Pseudochol deficiency Neg Hx   . Mitral valve prolapse Sister   . Hypertension Sister   . HIV Son     Social History:  History  Substance Use Topics  . Smoking status: Never Smoker   . Smokeless tobacco: Never Used  . Alcohol Use: No    Review of Systems: Review of Systems  Constitutional: Negative for fever, chills and weight loss.  HENT: Negative for congestion, ear pain and sore throat.   Eyes: Negative.   Respiratory: Negative for cough, shortness of breath and wheezing.   Cardiovascular: Negative for chest pain, palpitations and leg swelling.  Gastrointestinal: Negative for heartburn, nausea, vomiting, diarrhea, constipation, blood in stool and melena.  Genitourinary: Negative.   Skin: Negative.   Neurological: Negative for dizziness, sensory change, loss of consciousness and headaches.  Psychiatric/Behavioral: Negative for depression. The patient is not nervous/anxious and does not have insomnia.     Physical Exam: Estimated body mass index is 25.72 kg/(m^2) as calculated from the following:   Height as of this encounter: 5' 5.5" (1.664 m).   Weight as of this encounter: 157 lb (71.215 kg). BP 104/70 mmHg  Pulse 66  Temp(Src) 98.2 F (36.8 C) (Temporal)  Resp 16  Ht 5' 5.5" (1.664 m)  Wt 157 lb (71.215 kg)  BMI 25.72 kg/m2  General Appearance: Well nourished well developed, in no apparent distress.  Eyes: PERRLA, EOMs, conjunctiva no swelling or erythema ENT/Mouth: Ear canals normal without obstruction, swelling, erythema, or discharge.  TMs normal bilaterally with no erythema, bulging, retraction, or loss of landmark.  Oropharynx moist and clear with no exudate, erythema, or swelling.   Neck: Supple, thyroid normal. No bruits.  No cervical adenopathy Respiratory: Respiratory effort normal, Breath sounds clear A&P without wheeze, rhonchi, rales.   Cardio: RRR without murmurs, rubs or gallops. Brisk peripheral pulses without edema.  Chest:  symmetric, with normal excursions Breasts: Symmetric, without lumps, nipple discharge, retractions.  Abdomen: Soft, nontender, no guarding, rebound, hernias, masses, or organomegaly.  Lymphatics: Non  tender without lymphadenopathy.  Musculoskeletal: Full ROM all peripheral extremities,5/5 strength, and normal gait. Ankle brace in place on right ankle Foot exam without nail or skin deformities.  Normal sensation to monofilament, well fitting shoes in place.   Skin: Warm, dry without rashes, lesions, ecchymosis. Neuro: Awake and oriented X 3, Cranial nerves intact, reflexes equal bilaterally. Normal muscle tone, no cerebellar symptoms. Sensation intact.  Psych:  normal affect, Insight and Judgment appropriate.   EKG: WNL no changes.  AORTA SCAN: WNL   Over 40 minutes of exam, counseling, chart review and critical decision making was performed  Loma Sousa Forcucci 9:40 AM [facility]

## 2014-09-24 LAB — INSULIN, RANDOM: Insulin: 6.1 u[IU]/mL (ref 2.0–19.6)

## 2014-09-24 LAB — URINALYSIS, ROUTINE W REFLEX MICROSCOPIC
Bilirubin Urine: NEGATIVE
Glucose, UA: NEGATIVE mg/dL
Hgb urine dipstick: NEGATIVE
Ketones, ur: NEGATIVE mg/dL
Leukocytes, UA: NEGATIVE
Nitrite: NEGATIVE
Protein, ur: NEGATIVE mg/dL
Specific Gravity, Urine: 1.022 (ref 1.005–1.030)
Urobilinogen, UA: 0.2 mg/dL (ref 0.0–1.0)
pH: 5.5 (ref 5.0–8.0)

## 2014-09-24 LAB — VITAMIN D 25 HYDROXY (VIT D DEFICIENCY, FRACTURES): Vit D, 25-Hydroxy: 58 ng/mL (ref 30–100)

## 2014-09-24 LAB — MICROALBUMIN / CREATININE URINE RATIO
Creatinine, Urine: 149.4 mg/dL
Microalb Creat Ratio: 3.3 mg/g (ref 0.0–30.0)
Microalb, Ur: 0.5 mg/dL (ref <2.0)

## 2014-10-03 ENCOUNTER — Other Ambulatory Visit (INDEPENDENT_AMBULATORY_CARE_PROVIDER_SITE_OTHER): Payer: Commercial Managed Care - HMO

## 2014-10-03 DIAGNOSIS — Z1212 Encounter for screening for malignant neoplasm of rectum: Secondary | ICD-10-CM

## 2014-10-03 LAB — POC HEMOCCULT BLD/STL (HOME/3-CARD/SCREEN)
FECAL OCCULT BLD: NEGATIVE
FECAL OCCULT BLD: NEGATIVE
Fecal Occult Blood, POC: NEGATIVE

## 2014-10-04 ENCOUNTER — Telehealth: Payer: Self-pay | Admitting: Internal Medicine

## 2014-10-04 NOTE — Telephone Encounter (Signed)
Patient called to request a letter stating that she has history of requiring multiply mammograms, at annual screenings and it would be beneficial for her to have the 3D mammography.  Thank you, Leonie Douglas Referral Coordinator  [facility] [phone] ext. [phone] Fax [phone]

## 2014-10-05 ENCOUNTER — Encounter: Payer: Self-pay | Admitting: Physician Assistant

## 2014-10-06 ENCOUNTER — Encounter: Payer: Self-pay | Admitting: Internal Medicine

## 2014-10-06 ENCOUNTER — Ambulatory Visit (INDEPENDENT_AMBULATORY_CARE_PROVIDER_SITE_OTHER): Payer: Commercial Managed Care - HMO | Admitting: Internal Medicine

## 2014-10-06 VITALS — BP 120/72 | HR 98 | Temp 98.2°F | Resp 18 | Ht 65.5 in

## 2014-10-06 DIAGNOSIS — T148 Other injury of unspecified body region: Secondary | ICD-10-CM | POA: Diagnosis not present

## 2014-10-06 DIAGNOSIS — W57XXXA Bitten or stung by nonvenomous insect and other nonvenomous arthropods, initial encounter: Secondary | ICD-10-CM | POA: Diagnosis not present

## 2014-10-06 DIAGNOSIS — T7840XA Allergy, unspecified, initial encounter: Secondary | ICD-10-CM | POA: Diagnosis not present

## 2014-10-06 DIAGNOSIS — L509 Urticaria, unspecified: Secondary | ICD-10-CM

## 2014-10-06 MED ORDER — PREDNISONE 20 MG PO TABS: ORAL_TABLET | ORAL | Status: DC

## 2014-10-06 MED ORDER — DEXAMETHASONE SODIUM PHOSPHATE 100 MG/10ML IJ SOLN
10.0000 mg | Freq: Once | INTRAMUSCULAR | Status: AC
  Administered 2014-10-06: 10 mg via INTRAMUSCULAR

## 2014-10-06 MED ORDER — DIPHENHYDRAMINE HCL 25 MG PO TABS: 50.0000 mg | ORAL_TABLET | Freq: Four times a day (QID) | ORAL | Status: DC | PRN

## 2014-10-06 NOTE — Patient Instructions (Signed)
Hives Hives are itchy, red, swollen areas of the skin. They can vary in size and location on your body. Hives can come and go for hours or several days (acute hives) or for several weeks (chronic hives). Hives do not spread from person to person (noncontagious). They may get worse with scratching, exercise, and emotional stress. CAUSES   Allergic reaction to food, additives, or drugs.  Infections, including the common cold.  Illness, such as vasculitis, lupus, or thyroid disease.  Exposure to sunlight, heat, or cold.  Exercise.  Stress.  Contact with chemicals. SYMPTOMS   Red or white swollen patches on the skin. The patches may change size, shape, and location quickly and repeatedly.  Itching.  Swelling of the hands, feet, and face. This may occur if hives develop deeper in the skin. DIAGNOSIS  Your caregiver can usually tell what is wrong by performing a physical exam. Skin or blood tests may also be done to determine the cause of your hives. In some cases, the cause cannot be determined. TREATMENT  Mild cases usually get better with medicines such as antihistamines. Severe cases may require an emergency epinephrine injection. If the cause of your hives is known, treatment includes avoiding that trigger.  HOME CARE INSTRUCTIONS   Avoid causes that trigger your hives.  Take antihistamines as directed by your caregiver to reduce the severity of your hives. Non-sedating or low-sedating antihistamines are usually recommended. Do not drive while taking an antihistamine.  Take any other medicines prescribed for itching as directed by your caregiver.  Wear loose-fitting clothing.  Keep all follow-up appointments as directed by your caregiver. SEEK MEDICAL CARE IF:   You have persistent or severe itching that is not relieved with medicine.  You have painful or swollen joints. SEEK IMMEDIATE MEDICAL CARE IF:   You have a fever.  Your tongue or lips are swollen.  You have  trouble breathing or swallowing.  You feel tightness in the throat or chest.  You have abdominal pain. These problems may be the first sign of a life-threatening allergic reaction. Call your local emergency services (911 in [place] MAKE SURE YOU:   Understand these instructions.  Will watch your condition.  Will get help right away if you are not doing well or get worse. Document Released: 03/04/2005 Document Revised: 03/09/2013 Document Reviewed: 05/28/2011 Exit[employer] Patient Information 2015 [employer]. This information is not intended to replace advice given to you by your health care provider. Make sure you discuss any questions you have with your health care provider.  

## 2014-10-06 NOTE — Progress Notes (Signed)
   Subjective:    Patient ID: Sara [name], female    DOB: 20-Sep-1945, 69 y.o.   MRN: [id number]  HPI  Patient reports that at 9 am this morning she was mowing her grass and was stong a dozen times by wasps.  She reports that since that time she has been breaking out in welts and hives.  SHe has used calamine lotion and has also taken a claritin.  She reports that she has never had a reaction to bees before.    Review of Systems  Constitutional: Negative for fever, chills and fatigue.  Respiratory: Negative for chest tightness and shortness of breath.        Objective:   Physical Exam  Constitutional: She is oriented to person, place, and time. She appears well-developed and well-nourished. No distress.  HENT:  Head: Normocephalic and atraumatic.  Mouth/Throat: Oropharynx is clear and moist. No oropharyngeal exudate.  Eyes: Conjunctivae are normal. No scleral icterus.  Neck: Normal range of motion. Neck supple. No JVD present. No thyromegaly present.  Cardiovascular: Normal rate, regular rhythm, normal heart sounds and intact distal pulses.  Exam reveals no gallop and no friction rub.   No murmur heard. Pulmonary/Chest: Effort normal and breath sounds normal. No respiratory distress. She has no wheezes. She has no rales. She exhibits no tenderness.  Abdominal: Soft. Bowel sounds are normal. She exhibits no distension and no mass. There is no tenderness. There is no rebound and no guarding.  Musculoskeletal: Normal range of motion.  Lymphadenopathy:    She has no cervical adenopathy.  Neurological: She is alert and oriented to person, place, and time.  Skin: Skin is warm and dry. Rash noted. Rash is urticarial. She is not diaphoretic.  Generalized hives over the inner thighs, bilateral arms and trunk.  No facial swelling or angioedema.    Psychiatric: She has a normal mood and affect. Her behavior is normal. Judgment and thought content normal.  Nursing note and vitals  reviewed.  Filed Vitals:   10/06/14 0917  BP: 120/72  Pulse: 98  Temp: 98.2 F (36.8 C)  Resp: 18          Assessment & Plan:   Allergic reaction to bug bite.  No evidence of anaphylaxis.  Decadron given here and also 50 mg benadryl here.    -50 mg benadryl q6hrs -prednisone taper -continue ranitidine  Patient instructed to go to the ER for any symptoms of anaphylaxis including facial swelling, SOB, CP, wheezing.  1. Hives   2. Bug bite   3. Allergic reaction, initial encounter

## 2014-10-06 NOTE — Addendum Note (Signed)
Addended by: Elis Sauber A on: 10/06/2014 10:07 AM   Modules accepted: Orders

## 2014-10-18 DIAGNOSIS — M25572 Pain in left ankle and joints of left foot: Secondary | ICD-10-CM | POA: Diagnosis not present

## 2014-10-18 DIAGNOSIS — S93491D Sprain of other ligament of right ankle, subsequent encounter: Secondary | ICD-10-CM | POA: Diagnosis not present

## 2014-11-02 ENCOUNTER — Other Ambulatory Visit: Payer: Self-pay | Admitting: Internal Medicine

## 2014-11-25 DIAGNOSIS — M810 Age-related osteoporosis without current pathological fracture: Secondary | ICD-10-CM | POA: Diagnosis not present

## 2014-11-25 DIAGNOSIS — Z1231 Encounter for screening mammogram for malignant neoplasm of breast: Secondary | ICD-10-CM | POA: Diagnosis not present

## 2014-11-29 DIAGNOSIS — H4011X3 Primary open-angle glaucoma, severe stage: Secondary | ICD-10-CM | POA: Diagnosis not present

## 2014-12-04 ENCOUNTER — Other Ambulatory Visit: Payer: Self-pay | Admitting: Internal Medicine

## 2014-12-05 DIAGNOSIS — H2513 Age-related nuclear cataract, bilateral: Secondary | ICD-10-CM | POA: Diagnosis not present

## 2014-12-05 DIAGNOSIS — H4011X3 Primary open-angle glaucoma, severe stage: Secondary | ICD-10-CM | POA: Diagnosis not present

## 2014-12-06 ENCOUNTER — Telehealth: Payer: Self-pay | Admitting: *Deleted

## 2014-12-06 NOTE — Telephone Encounter (Signed)
Patient called and requested an RX for Tramadol for lower spine pain.  Per Dr Melford Aase, he can not RX meds for chronic pain.  He suggested patient be referred to the pain management doctors.  Per patient, she has been to pain management and will discuss this with Dr Melford Aase at her 12/2014 OV.

## 2014-12-06 NOTE — Telephone Encounter (Signed)
Pt aware of BMD results.

## 2014-12-13 ENCOUNTER — Encounter: Payer: Self-pay | Admitting: Internal Medicine

## 2014-12-13 ENCOUNTER — Other Ambulatory Visit: Payer: Self-pay | Admitting: Internal Medicine

## 2014-12-13 MED ORDER — DICLOFENAC SODIUM 1 % TD GEL: 2.0000 g | Freq: Four times a day (QID) | TRANSDERMAL | Status: DC

## 2014-12-16 ENCOUNTER — Other Ambulatory Visit: Payer: Self-pay | Admitting: Internal Medicine

## 2014-12-20 ENCOUNTER — Other Ambulatory Visit: Payer: Self-pay | Admitting: Internal Medicine

## 2014-12-21 ENCOUNTER — Other Ambulatory Visit: Payer: Self-pay | Admitting: *Deleted

## 2014-12-21 MED ORDER — DICLOFENAC SODIUM 1 % TD GEL: 2.0000 g | Freq: Four times a day (QID) | TRANSDERMAL | Status: DC

## 2014-12-24 ENCOUNTER — Encounter: Payer: Self-pay | Admitting: *Deleted

## 2014-12-26 ENCOUNTER — Encounter: Payer: Self-pay | Admitting: Internal Medicine

## 2015-01-04 ENCOUNTER — Encounter: Payer: Self-pay | Admitting: Internal Medicine

## 2015-01-04 ENCOUNTER — Ambulatory Visit (INDEPENDENT_AMBULATORY_CARE_PROVIDER_SITE_OTHER): Payer: Commercial Managed Care - HMO | Admitting: Internal Medicine

## 2015-01-04 VITALS — BP 110/62 | HR 76 | Temp 97.5°F | Resp 16 | Ht 65.0 in | Wt 158.8 lb

## 2015-01-04 DIAGNOSIS — E663 Overweight: Secondary | ICD-10-CM | POA: Insufficient documentation

## 2015-01-04 DIAGNOSIS — I10 Essential (primary) hypertension: Secondary | ICD-10-CM | POA: Diagnosis not present

## 2015-01-04 DIAGNOSIS — Z23 Encounter for immunization: Secondary | ICD-10-CM | POA: Diagnosis not present

## 2015-01-04 DIAGNOSIS — Z6826 Body mass index (BMI) 26.0-26.9, adult: Secondary | ICD-10-CM | POA: Diagnosis not present

## 2015-01-04 DIAGNOSIS — E7800 Pure hypercholesterolemia, unspecified: Secondary | ICD-10-CM | POA: Diagnosis not present

## 2015-01-04 DIAGNOSIS — E559 Vitamin D deficiency, unspecified: Secondary | ICD-10-CM | POA: Diagnosis not present

## 2015-01-04 DIAGNOSIS — K219 Gastro-esophageal reflux disease without esophagitis: Secondary | ICD-10-CM | POA: Diagnosis not present

## 2015-01-04 DIAGNOSIS — E1121 Type 2 diabetes mellitus with diabetic nephropathy: Secondary | ICD-10-CM | POA: Diagnosis not present

## 2015-01-04 DIAGNOSIS — Z79899 Other long term (current) drug therapy: Secondary | ICD-10-CM | POA: Diagnosis not present

## 2015-01-04 DIAGNOSIS — E1129 Type 2 diabetes mellitus with other diabetic kidney complication: Secondary | ICD-10-CM | POA: Diagnosis not present

## 2015-01-04 LAB — LIPID PANEL
CHOL/HDL RATIO: 2 ratio (ref <=5.0)
Cholesterol: 149 mg/dL (ref 125–200)
HDL: 74 mg/dL (ref >=46)
LDL CALC: 66 mg/dL (ref <130)
TRIGLYCERIDES: 45 mg/dL (ref <150)
VLDL: 9 mg/dL (ref <30)

## 2015-01-04 LAB — TSH: TSH: 1.279 u[IU]/mL (ref 0.350–4.500)

## 2015-01-04 LAB — CBC WITH DIFFERENTIAL/PLATELET
BASOS ABS: 0 10*3/uL (ref 0.0–0.1)
Basophils Relative: 1 % (ref 0–1)
EOS ABS: 0.1 10*3/uL (ref 0.0–0.7)
EOS PCT: 3 % (ref 0–5)
HCT: 38.7 % (ref 36.0–46.0)
Hemoglobin: 12.3 g/dL (ref 12.0–15.0)
Lymphocytes Relative: 37 % (ref 12–46)
Lymphs Abs: 1.3 10*3/uL (ref 0.7–4.0)
MCH: 28.6 pg (ref 26.0–34.0)
MCHC: 31.8 g/dL (ref 30.0–36.0)
MCV: 90 fL (ref 78.0–100.0)
MONO ABS: 0.3 10*3/uL (ref 0.1–1.0)
MPV: 9.8 fL (ref 8.6–12.4)
Monocytes Relative: 8 % (ref 3–12)
Neutro Abs: 1.8 10*3/uL (ref 1.7–7.7)
Neutrophils Relative %: 51 % (ref 43–77)
PLATELETS: 248 10*3/uL (ref 150–400)
RBC: 4.3 MIL/uL (ref 3.87–5.11)
RDW: 14.4 % (ref 11.5–15.5)
WBC: 3.6 10*3/uL — AB (ref 4.0–10.5)

## 2015-01-04 LAB — HEPATIC FUNCTION PANEL
ALBUMIN: 4.3 g/dL (ref 3.6–5.1)
ALK PHOS: 35 U/L (ref 33–130)
ALT: 19 U/L (ref 6–29)
AST: 18 U/L (ref 10–35)
BILIRUBIN DIRECT: 0.2 mg/dL (ref <=0.2)
BILIRUBIN TOTAL: 0.6 mg/dL (ref 0.2–1.2)
Indirect Bilirubin: 0.4 mg/dL (ref 0.2–1.2)
Total Protein: 6.4 g/dL (ref 6.1–8.1)

## 2015-01-04 LAB — BASIC METABOLIC PANEL WITH GFR
BUN: 14 mg/dL (ref 7–25)
CHLORIDE: 108 mmol/L (ref 98–110)
CO2: 28 mmol/L (ref 20–31)
Calcium: 9.5 mg/dL (ref 8.6–10.4)
Creat: 0.83 mg/dL (ref 0.50–0.99)
GFR, EST AFRICAN AMERICAN: 85 mL/min (ref >=60)
GFR, Est Non African American: 74 mL/min (ref >=60)
Glucose, Bld: 108 mg/dL — ABNORMAL HIGH (ref 65–99)
POTASSIUM: 4.7 mmol/L (ref 3.5–5.3)
SODIUM: 144 mmol/L (ref 135–146)

## 2015-01-04 LAB — MAGNESIUM: MAGNESIUM: 2 mg/dL (ref 1.5–2.5)

## 2015-01-04 LAB — HEMOGLOBIN A1C
HEMOGLOBIN A1C: 6.3 % — AB (ref <5.7)
MEAN PLASMA GLUCOSE: 134 mg/dL — AB (ref <117)

## 2015-01-04 NOTE — Patient Instructions (Signed)

## 2015-01-04 NOTE — Addendum Note (Signed)
Addended by: Melbourne Abts C on: 01/04/2015 11:56 AM   Modules accepted: Orders

## 2015-01-04 NOTE — Progress Notes (Signed)
Patient ID: Sara [name], female   DOB: September 04, 1945, 69 y.o.   MRN: [id number]   This very nice 69 y.o. SBF presents for 3 month follow up with Hypertension, Hyperlipidemia, Pre-Diabetes and Vitamin D Deficiency. Main c/o is LBP related to DDD & says her Voltaren gel works best of all.    Patient is treated for HTN since 2003 & BP has been controlled at home. Today's BP: 110/62 mmHg. Patient has had no complaints of any cardiac type chest pain, palpitations, dyspnea/orthopnea/PND, dizziness, claudication, or dependent edema.   Hyperlipidemia is controlled with diet & meds. Patient denies myalgias or other med SE's. Last Lipids were at goal with Cholesterol 149; HDL 65; LDL 72; Triglycerides 61 on 09/23/2014.   Also, the patient has history of T2_NIDDM since 2011 w/ CKD 2 (GFR 80 ml/min)  and has had no symptoms of reactive hypoglycemia, diabetic polys, paresthesias or visual blurring.  FBG's range 90-100 mg%. Last A1c was 6.4% on 09/23/2014.    Further, the patient also has history of Vitamin D Deficiency of 42 on treatment  in 2014 and supplements vitamin D without any suspected side-effects. Last vitamin D was  58 on  09/23/2014.  Medication Sig  . TYLENOL ARTHRITIS  650 MG CR Take 1,300 mg by mouth every 8 (eight) hours as needed. For pain  . alendronate (FOSAMAX) 70 MG tablet Take 1 tablet (70 mg total) by mouth once a week. Take with a full glass of water on an empty stomach.  Tonita Cong C  Take 1,000 mg by mouth daily.   Marland Kitchen aspirin EC 81 MG  Take 81 mg by mouth daily.  Marland Kitchen atorvastatin  40 MG TAKE 1 TABLET EVERY DAY  . baclofen  10 MG tablet Take 1/2 to 1 tablet 2 to 3 times a day as needed for muscle spasm.  . benazepril-hctz 20-25 MG  TAKE 1 TABLET EVERY DAY  . BETOPTIC-S 0.25 % ophth susp Place 1 drop into both eyes 2 (two) times daily.  Marland Kitchen SIMBRINZA 1-0.2 % SUSP Apply to eye. 2 drops each daily  . CALCIUM PO Take 500 mg by mouth daily.   . diclofenac  1 % GEL Apply 2 g topically 4 (four) times  daily.  . diphenhydrAMINE 25 MG tablet Take 2 tablets (50 mg total) by mouth every 6 (six) hours as needed for itching.  . gabapentin  100 MG capsule TAKE 1 CAPSULE THREE TIMES DAILY  . KRILL OIL PO Take by mouth daily.  Marland Kitchen LORazepam (ATIVAN) 2 MG tablet TAKE 1/2 TO 1 TABLET THREE TIMES DAILY AS NEEDED FOR ANXIETY  . metFORMIN -XR 500 MG 24 hr tablet TAKE 1 TABLET AT BEDTIME  . MULTIVITAMIN WITH MINERALS Take 1 tablet by mouth daily.  . ranitidine (ZANTAC) 300 MG tablet Take 1 tablet (300 mg total) by mouth at bedtime.  Marland Kitchen VITAMIN E PO Take 1 capsule by mouth daily.   . traMADol (ULTRAM) 50 MG tablet Take 50 mg by mouth 2 (two) times daily.   Allergies  Allergen Reactions  . Penicillins Shortness Of Breath    And hives  . Aspirin Other (See Comments)    Stomach upset, but low dose is ok  . Sulfa Antibiotics Other (See Comments)    unknown  . Codeine Rash   PMHx:   Past Medical History  Diagnosis Date  . Sarcoidosis ([place]) >61yrs ago    no problems since  . DeQuervain's disease (tenosynovitis)   . Diverticulitis   .  Bronchitis     hx of;>66yrs ago  . Headache(784.0)     related neck issues  . Dizziness     related to neck issues  . Chronic neck pain     arthritis  . History of shingles 2012  . Insomnia     d/t pain and takes Ativan nightly  . GERD (gastroesophageal reflux disease)     takes Ranitidine nightly  . Glaucoma   . High cholesterol     takes Lipitor daily  . Hypertension     takes Lotensin HCT daily  . COPD (chronic obstructive pulmonary disease) ([place])   . Asthma   . Degenerative disc disease     neck  . Frozen shoulder September 2012  . Hay fever   . Type II or unspecified type diabetes mellitus without mention of complication, not stated as uncontrolled     takes Metformin nightly  . Osteoporosis    Immunization History  Administered Date(s) Administered  . Pneumococcal Conjugate-13 11/16/2013  . Pneumococcal-Unspecified 03/18/2005  . Tdap 03/18/2009   . Zoster 11/13/2012   Past Surgical History  Procedure Laterality Date  . Carpal tunnel release  2012  . Wrist surgery      ORIF left wrist and then removed d/t protruding screw in 2012  . Breast surgery  20+yrs ago    left breast;benign  . Tonsillectomy  30+yrs ago  . Abdominal hysterectomy  1975  . Refractive surgery  2011    bilateral  . Colonoscopy    . Anterior cervical decomp/discectomy fusion  07/31/2011    Procedure: ANTERIOR CERVICAL DECOMPRESSION/DISCECTOMY FUSION 3 LEVELS;  Surgeon: Hosie Spangle, MD;  Location: [facility];  Service: Neurosurgery;  Laterality: N/A;  Cervical four-five,Cervical five-six,Cervical six-seven anterior cervical decompression with fusion plating and bonegraft   FHx:    Reviewed / unchanged  SHx:    Reviewed / unchanged  Systems Review:  Constitutional: Denies fever, chills, wt changes, headaches, insomnia, fatigue, night sweats, change in appetite. Eyes: Denies redness, blurred vision, diplopia, discharge, itchy, watery eyes.  ENT: Denies discharge, congestion, post nasal drip, epistaxis, sore throat, earache, hearing loss, dental pain, tinnitus, vertigo, sinus pain, snoring.  CV: Denies chest pain, palpitations, irregular heartbeat, syncope, dyspnea, diaphoresis, orthopnea, PND, claudication or edema. Respiratory: denies cough, dyspnea, DOE, pleurisy, hoarseness, laryngitis, wheezing.  Gastrointestinal: Denies dysphagia, odynophagia, heartburn, reflux, water brash, abdominal pain or cramps, nausea, vomiting, bloating, diarrhea, constipation, hematemesis, melena, hematochezia  or hemorrhoids. Genitourinary: Denies dysuria, frequency, urgency, nocturia, hesitancy, discharge, hematuria or flank pain. Musculoskeletal: Denies arthralgias, myalgias, stiffness, jt. swelling, pain, limping or strain/sprain.  Skin: Denies pruritus, rash, hives, warts, acne, eczema or change in skin lesion(s). Neuro: No weakness, tremor, incoordination, spasms,  paresthesia or pain. Psychiatric: Denies confusion, memory loss or sensory loss. Endo: Denies change in weight, skin or hair change.  Heme/Lymph: No excessive bleeding, bruising or enlarged lymph nodes.  Physical Exam  BP 110/62 mmHg  Pulse 76  Temp(Src) 97.5 F (36.4 C)  Resp 16  Ht 5\' 5"  (1.651 m)  Wt 158 lb 12.8 oz (72.031 kg)  BMI 26.43 kg/m2  Appears well nourished and in no distress. Eyes: PERRLA, EOMs, conjunctiva no swelling or erythema. Sinuses: No frontal/maxillary tenderness ENT/Mouth: EAC's clear, TM's nl w/o erythema, bulging. Nares clear w/o erythema, swelling, exudates. Oropharynx clear without erythema or exudates. Oral hygiene is good. Tongue normal, non obstructing. Hearing intact.  Neck: Supple. Thyroid nl. Car 2+/2+ without bruits, nodes or JVD. Chest: Respirations nl with  BS clear & equal w/o rales, rhonchi, wheezing or stridor.  Cor: Heart sounds normal w/ regular rate and rhythm without sig. murmurs, gallops, clicks, or rubs. Peripheral pulses normal and equal  without edema.  Abdomen: Soft & bowel sounds normal. Non-tender w/o guarding, rebound, hernias, masses, or organomegaly.  Lymphatics: Unremarkable.  Musculoskeletal: Full ROM all peripheral extremities, joint stability, 5/5 strength, and normal gait.  Skin: Warm, dry without exposed rashes, lesions or ecchymosis apparent.  Neuro: Cranial nerves intact, reflexes equal bilaterally. Sensory-motor testing grossly intact. Tendon reflexes grossly intact.  Pysch: Alert & oriented x 3.  Insight and judgement nl & appropriate. No ideations.  Assessment and Plan:  1. Essential hypertension  - TSH  2. Hyperlipidemia  - Lipid panel  3. Type 2 diabetes mellitus with diabetic nephropathy, without long-term current use of insulin (HCC)  - Hemoglobin A1c - Insulin, random  4. Vitamin D deficiency  - Vit D  25 hydroxy   5. Gastroesophageal reflux disease, esophagitis presence not specified   6. BMI  26.43,  adult   7. Medication management  - CBC with Differential/Platelet - BASIC METABOLIC PANEL WITH GFR - Hepatic function panel - Magnesium   Recommended regular exercise, BP monitoring, weight control, and discussed med and SE's. Recommended labs to assess and monitor clinical status. Further disposition pending results of labs. Over 30 minutes of exam, counseling, chart review was performed

## 2015-01-05 LAB — INSULIN, RANDOM: INSULIN: 9 u[IU]/mL (ref 2.0–19.6)

## 2015-01-05 LAB — VITAMIN D 25 HYDROXY (VIT D DEFICIENCY, FRACTURES): Vit D, 25-Hydroxy: 51 ng/mL (ref 30–100)

## 2015-02-05 ENCOUNTER — Encounter: Payer: Self-pay | Admitting: *Deleted

## 2015-02-18 ENCOUNTER — Other Ambulatory Visit: Payer: Self-pay | Admitting: Internal Medicine

## 2015-02-23 ENCOUNTER — Other Ambulatory Visit: Payer: Self-pay | Admitting: Internal Medicine

## 2015-02-27 ENCOUNTER — Other Ambulatory Visit: Payer: Self-pay | Admitting: Internal Medicine

## 2015-02-27 DIAGNOSIS — E782 Mixed hyperlipidemia: Secondary | ICD-10-CM

## 2015-02-27 DIAGNOSIS — I10 Essential (primary) hypertension: Secondary | ICD-10-CM

## 2015-04-12 ENCOUNTER — Encounter: Payer: Self-pay | Admitting: Internal Medicine

## 2015-04-12 ENCOUNTER — Ambulatory Visit (INDEPENDENT_AMBULATORY_CARE_PROVIDER_SITE_OTHER): Payer: Commercial Managed Care - HMO | Admitting: Internal Medicine

## 2015-04-12 VITALS — BP 126/74 | HR 68 | Temp 98.0°F | Resp 16 | Ht 65.0 in | Wt 169.0 lb

## 2015-04-12 DIAGNOSIS — Z23 Encounter for immunization: Secondary | ICD-10-CM | POA: Diagnosis not present

## 2015-04-12 DIAGNOSIS — Z79899 Other long term (current) drug therapy: Secondary | ICD-10-CM | POA: Diagnosis not present

## 2015-04-12 DIAGNOSIS — N182 Chronic kidney disease, stage 2 (mild): Secondary | ICD-10-CM

## 2015-04-12 DIAGNOSIS — E7800 Pure hypercholesterolemia, unspecified: Secondary | ICD-10-CM

## 2015-04-12 DIAGNOSIS — E559 Vitamin D deficiency, unspecified: Secondary | ICD-10-CM

## 2015-04-12 DIAGNOSIS — E1121 Type 2 diabetes mellitus with diabetic nephropathy: Secondary | ICD-10-CM | POA: Diagnosis not present

## 2015-04-12 DIAGNOSIS — I10 Essential (primary) hypertension: Secondary | ICD-10-CM

## 2015-04-12 DIAGNOSIS — E1129 Type 2 diabetes mellitus with other diabetic kidney complication: Secondary | ICD-10-CM | POA: Diagnosis not present

## 2015-04-12 LAB — CBC WITH DIFFERENTIAL/PLATELET
BASOS PCT: 0 % (ref 0–1)
Basophils Absolute: 0 10*3/uL (ref 0.0–0.1)
EOS ABS: 0.1 10*3/uL (ref 0.0–0.7)
EOS PCT: 2 % (ref 0–5)
HCT: 38.9 % (ref 36.0–46.0)
HEMOGLOBIN: 12.5 g/dL (ref 12.0–15.0)
Lymphocytes Relative: 32 % (ref 12–46)
Lymphs Abs: 1.6 10*3/uL (ref 0.7–4.0)
MCH: 29.3 pg (ref 26.0–34.0)
MCHC: 32.1 g/dL (ref 30.0–36.0)
MCV: 91.3 fL (ref 78.0–100.0)
MONO ABS: 0.3 10*3/uL (ref 0.1–1.0)
MONOS PCT: 7 % (ref 3–12)
MPV: 9.1 fL (ref 8.6–12.4)
Neutro Abs: 2.9 10*3/uL (ref 1.7–7.7)
Neutrophils Relative %: 59 % (ref 43–77)
PLATELETS: 244 10*3/uL (ref 150–400)
RBC: 4.26 MIL/uL (ref 3.87–5.11)
RDW: 14.5 % (ref 11.5–15.5)
WBC: 4.9 10*3/uL (ref 4.0–10.5)

## 2015-04-12 LAB — HEPATIC FUNCTION PANEL
ALT: 16 U/L (ref 6–29)
AST: 16 U/L (ref 10–35)
Albumin: 4 g/dL (ref 3.6–5.1)
Alkaline Phosphatase: 45 U/L (ref 33–130)
BILIRUBIN DIRECT: 0.1 mg/dL (ref <=0.2)
Indirect Bilirubin: 0.5 mg/dL (ref 0.2–1.2)
Total Bilirubin: 0.6 mg/dL (ref 0.2–1.2)
Total Protein: 6.7 g/dL (ref 6.1–8.1)

## 2015-04-12 LAB — BASIC METABOLIC PANEL WITH GFR
BUN: 12 mg/dL (ref 7–25)
CALCIUM: 9.4 mg/dL (ref 8.6–10.4)
CO2: 29 mmol/L (ref 20–31)
CREATININE: 0.8 mg/dL (ref 0.50–0.99)
Chloride: 102 mmol/L (ref 98–110)
GFR, Est African American: 88 mL/min (ref >=60)
GFR, Est Non African American: 77 mL/min (ref >=60)
Glucose, Bld: 92 mg/dL (ref 65–99)
Potassium: 4.3 mmol/L (ref 3.5–5.3)
SODIUM: 139 mmol/L (ref 135–146)

## 2015-04-12 LAB — LIPID PANEL
Cholesterol: 176 mg/dL (ref 125–200)
HDL: 64 mg/dL (ref >=46)
LDL Cholesterol: 93 mg/dL (ref <130)
TRIGLYCERIDES: 97 mg/dL (ref <150)
Total CHOL/HDL Ratio: 2.8 Ratio (ref <=5.0)
VLDL: 19 mg/dL (ref <30)

## 2015-04-12 LAB — TSH: TSH: 1.717 u[IU]/mL (ref 0.350–4.500)

## 2015-04-12 LAB — HEMOGLOBIN A1C
Hgb A1c MFr Bld: 6.4 % — ABNORMAL HIGH (ref <5.7)
Mean Plasma Glucose: 137 mg/dL — ABNORMAL HIGH (ref <117)

## 2015-04-12 MED ORDER — ALBUTEROL SULFATE HFA 108 (90 BASE) MCG/ACT IN AERS: 2.0000 | INHALATION_SPRAY | Freq: Four times a day (QID) | RESPIRATORY_TRACT | Status: DC | PRN

## 2015-04-12 MED ORDER — DICLOFENAC SODIUM 1 % TD GEL: TRANSDERMAL | Status: DC

## 2015-04-12 NOTE — Addendum Note (Signed)
Addended by: Sherril Heyward A on: 04/12/2015 09:35 AM   Modules accepted: Orders

## 2015-04-12 NOTE — Progress Notes (Signed)
Patient ID: Sara [name], female   DOB: [birth year], 70 y.o.   MRN: [id number]  Assessment and Plan:  Hypertension:  -Continue medication -monitor blood pressure at home. -Continue DASH diet -Reminder to go to the ER if any CP, SOB, nausea, dizziness, severe HA, changes vision/speech, left arm numbness and tingling and jaw pain.  Cholesterol - Continue diet and exercise -Check cholesterol.   Diabetes with diabetic chronic kidney disease -Continue diet and exercise.  -Check A1C  Vitamin D Def -check level -continue medications.   Need Pneumovax -given today   Continue diet and meds as discussed. Further disposition pending results of labs. Discussed med's effects and SE's.    HPI 70 y.o. female  presents for 3 month follow up with hypertension, hyperlipidemia, diabetes and vitamin D deficiency.   Her blood pressure has been controlled at home, today their BP is BP: 126/74 mmHg.She does not workout. She denies chest pain, shortness of breath, dizziness.   She is on cholesterol medication and denies myalgias. Her cholesterol is at goal. The cholesterol was:  01/04/2015: Cholesterol 149; HDL 74; LDL Cholesterol 66; Triglycerides 45   She has been working on diet and exercise for diabetes with diabetic chronic kidney disease, she is on bASA, she is on ACE/ARB, and denies  foot ulcerations, hyperglycemia, hypoglycemia , increased appetite, nausea, paresthesia of the feet, polydipsia, polyuria, visual disturbances, vomiting and weight loss. Last A1C was: 01/04/2015: Hgb A1c MFr Bld 6.3*.  Blood sugars are around 95-103.  She reports that she never goes above 125 in the mornings with her fasting sugars.  She reports that she hasn't had any really low blood sugars.     Patient is on Vitamin D supplement. 01/04/2015: Vit D, 25-Hydroxy 51  She does report that she does have some constipation and this has been giving her some stomach pain and some nausea.    Current Medications:   Current Outpatient Prescriptions on File Prior to Visit  Medication Sig Dispense Refill  . ACCU-CHEK SOFTCLIX LANCETS lancets CHECK BLOOD GLUCOSE EVERY DAY 100 each 0  . acetaminophen (TYLENOL ARTHRITIS PAIN) 650 MG CR tablet Take 1,300 mg by mouth every 8 (eight) hours as needed. For pain    . alendronate (FOSAMAX) 70 MG tablet TAKE 1 TABLET ONCE A WEEK. TAKE WITH A FULL GLASS OF WATER ON AN EMPTY STOMACH. 12 tablet 3  . Ascorbic Acid (VITAMIN C PO) Take 1,000 mg by mouth daily.     Marland Kitchen aspirin EC 81 MG tablet Take 81 mg by mouth daily.    Marland Kitchen atorvastatin (LIPITOR) 40 MG tablet TAKE 1 TABLET EVERY DAY 90 tablet 1  . baclofen (LIORESAL) 10 MG tablet Take 1/2 to 1 tablet 2 to 3 times a day as needed for muscle spasm. 270 each 0  . benazepril-hydrochlorthiazide (LOTENSIN HCT) 20-25 MG tablet TAKE 1 TABLET EVERY DAY 90 tablet 1  . betaxolol (BETOPTIC-S) 0.25 % ophthalmic suspension Place 1 drop into both eyes 2 (two) times daily.    . Blood Glucose Monitoring Suppl (ACCU-CHEK AVIVA PLUS) W/DEVICE KIT USE AS DIRECTED 1 kit 0  . Brinzolamide-Brimonidine (SIMBRINZA) 1-0.2 % SUSP Apply to eye. 2 drops each daily    . CALCIUM PO Take 500 mg by mouth daily.     Marland Kitchen gabapentin (NEURONTIN) 100 MG capsule TAKE 1 CAPSULE THREE TIMES DAILY 270 capsule 1  . glucose blood (ACCU-CHEK AVIVA PLUS) test strip Check blood glucose 1 time daily-DX-E11.22 100 each 12  . KRILL OIL PO  Take by mouth daily.    Marland Kitchen LORazepam (ATIVAN) 2 MG tablet TAKE 1/2 TO 1 TABLET THREE TIMES DAILY AS NEEDED FOR ANXIETY 270 tablet 1  . metFORMIN (GLUCOPHAGE-XR) 500 MG 24 hr tablet TAKE 1 TABLET AT BEDTIME 90 tablet 3  . Multiple Vitamin (MULTIVITAMIN WITH MINERALS) TABS tablet Take 1 tablet by mouth daily.    . ranitidine (ZANTAC) 300 MG tablet Take 1 tablet (300 mg total) by mouth at bedtime. 90 tablet 3  . VITAMIN E PO Take 1 capsule by mouth daily.     . VOLTAREN 1 % GEL APPLY  2  TO  4  GRAMS TOPICALLY FOUR TIMES DAILY 100 g 1   No  current facility-administered medications on file prior to visit.   Medical History:  Past Medical History  Diagnosis Date  . Sarcoidosis ([place]) >37yr ago    no problems since  . DeQuervain's disease (tenosynovitis)   . Diverticulitis   . Bronchitis     hx of;>89yr[place]  . Headache(784.0)     related neck issues  . Dizziness     related to neck issues  . Chronic neck pain     arthritis  . History of shingles 2012  . Insomnia     d/t pain and takes Ativan nightly  . GERD (gastroesophageal reflux disease)     takes Ranitidine nightly  . Glaucoma   . High cholesterol     takes Lipitor daily  . Hypertension     takes Lotensin HCT daily  . COPD (chronic obstructive pulmonary disease) ([place]  . Asthma   . Degenerative disc disease     neck  . Frozen shoulder September 2012  . Hay fever   . Type II or unspecified type diabetes mellitus without mention of complication, not stated as uncontrolled     takes Metformin nightly  . Osteoporosis    Allergies:  Allergies  Allergen Reactions  . Penicillins Shortness Of Breath    And hives  . Aspirin Other (See Comments)    Stomach upset, but low dose is ok  . Sulfa Antibiotics Other (See Comments)    unknown  . Codeine Rash     Review of Systems:  Review of Systems  Constitutional: Negative for fever, chills and malaise/fatigue.  HENT: Negative for congestion, ear pain and sore throat.   Respiratory: Positive for shortness of breath. Negative for cough, sputum production and wheezing.   Cardiovascular: Negative for chest pain, palpitations and leg swelling.  Gastrointestinal: Positive for constipation. Negative for heartburn, diarrhea, blood in stool and melena.  Genitourinary: Negative.   Skin: Negative.   Neurological: Negative for dizziness, sensory change, loss of consciousness and headaches.  Psychiatric/Behavioral: Negative for depression. The patient is not nervous/anxious and does not have insomnia.     Family  history- Review and unchanged  Social history- Review and unchanged  Physical Exam: BP 126/74 mmHg  Pulse 68  Temp(Src) 98 F (36.7 C) (Temporal)  Resp 16  Ht '5\' 5"'$  (1.651 m)  Wt 169 lb (76.658 kg)  BMI 28.12 kg/m2 Wt Readings from Last 3 Encounters:  04/12/15 169 lb (76.658 kg)  01/04/15 158 lb 12.8 oz (72.031 kg)  09/23/14 157 lb (71.215 kg)   General Appearance: Well nourished well developed, non-toxic appearing, in no apparent distress. Eyes: PERRLA, EOMs, conjunctiva no swelling or erythema ENT/Mouth: Ear canals clear with no erythema, swelling, or discharge.  TMs normal bilaterally, oropharynx clear, moist, with no exudate.   Neck:  Supple, thyroid normal, no JVD, no cervical adenopathy.  Respiratory: Respiratory effort normal, breath sounds clear A&P, no wheeze, rhonchi or rales noted.  No retractions, no accessory muscle usage Cardio: RRR with no MRGs. No noted edema.  Abdomen: Soft, + BS.  Non tender, no guarding, rebound, hernias, masses. Musculoskeletal: Full ROM, 5/5 strength, Normal gait Skin: Warm, dry without rashes, lesions, ecchymosis.  Neuro: Awake and oriented X 3, Cranial nerves intact. No cerebellar symptoms.  Psych: normal affect, Insight and Judgment appropriate.    Starlyn Skeans, PA-C 9:06 AM [facility]

## 2015-05-03 ENCOUNTER — Other Ambulatory Visit: Payer: Self-pay | Admitting: Internal Medicine

## 2015-05-03 DIAGNOSIS — H401133 Primary open-angle glaucoma, bilateral, severe stage: Secondary | ICD-10-CM | POA: Diagnosis not present

## 2015-05-08 DIAGNOSIS — H5210 Myopia, unspecified eye: Secondary | ICD-10-CM | POA: Diagnosis not present

## 2015-05-08 DIAGNOSIS — H5213 Myopia, bilateral: Secondary | ICD-10-CM | POA: Diagnosis not present

## 2015-05-30 ENCOUNTER — Other Ambulatory Visit: Payer: Self-pay | Admitting: Internal Medicine

## 2015-06-05 ENCOUNTER — Encounter: Payer: Self-pay | Admitting: Internal Medicine

## 2015-06-05 ENCOUNTER — Ambulatory Visit (INDEPENDENT_AMBULATORY_CARE_PROVIDER_SITE_OTHER): Payer: Commercial Managed Care - HMO | Admitting: Internal Medicine

## 2015-06-05 ENCOUNTER — Ambulatory Visit (HOSPITAL_COMMUNITY)
Admission: RE | Admit: 2015-06-05 | Discharge: 2015-06-05 | Disposition: A | Discharge status: Home / Self Care | Payer: Commercial Managed Care - HMO | Source: Ambulatory Visit | Attending: Internal Medicine | Admitting: Internal Medicine

## 2015-06-05 VITALS — BP 116/60 | HR 66 | Temp 98.2°F | Resp 16 | Ht 65.0 in | Wt 172.0 lb

## 2015-06-05 DIAGNOSIS — M722 Plantar fascial fibromatosis: Secondary | ICD-10-CM

## 2015-06-05 DIAGNOSIS — M7712 Lateral epicondylitis, left elbow: Secondary | ICD-10-CM

## 2015-06-05 DIAGNOSIS — M79672 Pain in left foot: Secondary | ICD-10-CM | POA: Insufficient documentation

## 2015-06-05 DIAGNOSIS — M542 Cervicalgia: Secondary | ICD-10-CM | POA: Diagnosis not present

## 2015-06-05 MED ORDER — MELOXICAM 15 MG PO TABS: 15.0000 mg | ORAL_TABLET | Freq: Every day | ORAL | Status: DC

## 2015-06-05 NOTE — Progress Notes (Signed)
Subjective:    Patient ID: Sara [name], female    DOB: Dec 13, 1945, 70 y.o.   MRN: [id number]  HPI  Patient presents to the office for evaluation of swelling of joints on the left side of the bodyx 2 weeks.  She reports that she is having a signficant amount of swelling in the left elbow, left knee and also the left ankle.  She also notes some pain in the right ankle as well.  She has been massaging it with vicks vapor rub and also massaging the left elbow.  She reports that she was told that her left anterior cervical fusion did not take.  She reports that she has more pain in her joints that the more pain that she gets.  She has been icing as well.  She does have a history of an ACDF from C4-C7.  She reports that Dr. Mina Marble told her that this was coming from the issue the hardware not fusing.     Review of Systems  Constitutional: Negative for fever, chills and fatigue.  Musculoskeletal: Positive for myalgias, joint swelling and arthralgias. Negative for neck pain and neck stiffness.  Neurological: Positive for numbness (left arm especially in left pinky and ring fingers). Negative for dizziness, tremors, weakness and headaches.       Objective:   Physical Exam  Constitutional: She is oriented to person, place, and time. She appears well-developed and well-nourished. No distress.  HENT:  Head: Normocephalic.  Mouth/Throat: Oropharynx is clear and moist. No oropharyngeal exudate.  Eyes: Conjunctivae are normal. No scleral icterus.  Neck: Normal range of motion. Neck supple.  Cardiovascular: Normal rate, regular rhythm, normal heart sounds and intact distal pulses.  Exam reveals no gallop and no friction rub.   No murmur heard. Pulses:      Radial pulses are 2+ on the right side, and 2+ on the left side.       Dorsalis pedis pulses are 2+ on the right side, and 2+ on the left side.       Posterior tibial pulses are 2+ on the right side, and 2+ on the left side.  Pulmonary/Chest:  Effort normal and breath sounds normal. No respiratory distress. She has no wheezes. She has no rales. She exhibits no tenderness.  Musculoskeletal:       Left elbow: She exhibits swelling. She exhibits normal range of motion, no effusion, no deformity and no laceration. Tenderness found. Medial epicondyle tenderness noted. No radial head, no lateral epicondyle and no olecranon process tenderness noted.       Left ankle: She exhibits decreased range of motion and swelling. She exhibits no ecchymosis, no deformity, no laceration and normal pulse. No lateral malleolus, no medial malleolus, no AITFL, no CF ligament, no posterior TFL, no head of 5th metatarsal and no proximal fibula tenderness found. Achilles tendon exhibits pain and abnormal Thompson's test results. Achilles tendon exhibits no defect.       Cervical back: Normal. She exhibits normal range of motion, no tenderness, no bony tenderness, no swelling, no edema, no deformity, no laceration, no pain, no spasm and normal pulse.  Left Elbow:  Minimal swelling to the medial epicondyle with no redness or gross effusion.  Tenderness over the medial epicondyle.  Full active rom.  Pain with resistance or pronation and supination.  Pain with resisted wrist flexion.     Left ankle and foot:  Mild swelling of the posterior heel and insertion of the plantar fascia.  Tenderness to  palpation at the insertion of the plantar fascia and deep to the achilles tendon.  Achilles tendon intact.  Normal Thompsons test. Normal ligamental laxity.  Full active ROM. Antalgic gait to the left.    Neurological: She is alert and oriented to person, place, and time.  Skin: She is not diaphoretic.  Nursing note and vitals reviewed.   Filed Vitals:   06/05/15 1111  BP: 116/60  Pulse: 66  Temp: 98.2 F (36.8 C)  Resp: 16         Assessment & Plan:    1. Neck pain -history of ACDF with possible lack of fusion per reports from [place] -patient requesting  second opinion. - Ambulatory referral to Orthopedic Surgery  2. Plantar fasciitis of left foot -mobic daily -massage foot with frozen water bottle -avoid walking barefoot -tennis shoes -if no improvement in 2 weeks send to Dr. Juline Patch or Mechele Claude PA-C - [facility] Foot Complete Left; Future  3. Left tennis elbow -mobic daily -tennis elbow strap -cont ice prn

## 2015-06-05 NOTE — Patient Instructions (Signed)
Tennis Elbow Tennis elbow (lateral epicondylitis) is inflammation of the outer tendons of your forearm close to your elbow. Your tendons attach your muscles to your bones. The outer tendons of your forearm are used to extend your wrist, and they attach on the outside part of your elbow. Tennis elbow is often found in people who play tennis, but anyone may get the condition from repeatedly extending the wrist or turning the forearm. CAUSES This condition is caused by repeatedly extending your wrist and using your hands. It can result from sports or work that requires repetitive forearm movements. Tennis elbow may also be caused by an injury. RISK FACTORS You have a higher risk of developing tennis elbow if you play tennis or another racquet sport. You also have a higher risk if you frequently use your hands for work. This condition is also more likely to develop in:  Musicians.  Carpenters, painters, and plumbers.  Cooks.  Cashiers.  People who work in factories.  Construction workers.  Butchers.  People who use computers. SYMPTOMS Symptoms of this condition include:  Pain and tenderness in your forearm and the outer part of your elbow. You may only feel the pain when you use your arm, or you may feel it even when you are not using your arm.  A burning feeling that runs from your elbow through your arm.  Weak grip in your hands. DIAGNOSIS  This condition may be diagnosed by medical history and physical exam. You may also have other tests, including:  X-rays.  MRI. TREATMENT Your health care provider will recommend lifestyle adjustments, such as resting and icing your arm. Treatment may also include:  Medicines for inflammation. This may include shots of cortisone if your pain continues.  Physical therapy. This may include massage or exercises.  An elbow brace. Surgery may eventually be recommended if your pain does not go away with treatment. HOME CARE  INSTRUCTIONS Activity  Rest your elbow and wrist as directed by your health care provider. Try to avoid any activities that caused the problem until your health care provider says that you can do them again.  If a physical therapist teaches you exercises, do all of them as directed.  If you lift an object, lift it with your palm facing upward. This lowers the stress on your elbow. Lifestyle  If your tennis elbow is caused by sports, check your equipment and make sure that:  You are using it correctly.  It is the best fit for you.  If your tennis elbow is caused by work, take breaks frequently, if you are able. Talk with your manager about how to best perform tasks in a way that is safe.  If your tennis elbow is caused by computer use, talk with your manager about any changes that can be made to your work environment. General Instructions  If directed, apply ice to the painful area:  Put ice in a plastic bag.  Place a towel between your skin and the bag.  Leave the ice on for 20 minutes, 2-3 times per day.  Take medicines only as directed by your health care provider.  If you were given a brace, wear it as directed by your health care provider.  Keep all follow-up visits as directed by your health care provider. This is important. SEEK MEDICAL CARE IF:  Your pain does not get better with treatment.  Your pain gets worse.  You have numbness or weakness in your forearm, hand, or fingers.     This information is not intended to replace advice given to you by your health care provider. Make sure you discuss any questions you have with your health care provider.   Document Released: 03/04/2005 Document Revised: 07/19/2014 Document Reviewed: 02/28/2014 Elsevier Interactive Patient Education 2016 [employer]  Plantar Fasciitis Plantar fasciitis is a painful foot condition that affects the heel. It occurs when the band of tissue that connects the toes to the heel bone (plantar  fascia) becomes irritated. This can happen after exercising too much or doing other repetitive activities (overuse injury). The pain from plantar fasciitis can range from mild irritation to severe pain that makes it difficult for you to walk or move. The pain is usually worse in the morning or after you have been sitting or lying down for a while. CAUSES This condition may be caused by:  Standing for long periods of time.  Wearing shoes that do not fit.  Doing high-impact activities, including running, aerobics, and ballet.  Being overweight.  Having an abnormal way of walking (gait).  Having tight calf muscles.  Having high arches in your feet.  Starting a new athletic activity. SYMPTOMS The main symptom of this condition is heel pain. Other symptoms include:  Pain that gets worse after activity or exercise.  Pain that is worse in the morning or after resting.  Pain that goes away after you walk for a few minutes. DIAGNOSIS This condition may be diagnosed based on your signs and symptoms. Your health care provider will also do a physical exam to check for:  A tender area on the bottom of your foot.  A high arch in your foot.  Pain when you move your foot.  Difficulty moving your foot. You may also need to have imaging studies to confirm the diagnosis. These can include:  X-rays.  Ultrasound.  MRI. TREATMENT  Treatment for plantar fasciitis depends on the severity of the condition. Your treatment may include:  Rest, ice, and over-the-counter pain medicines to manage your pain.  Exercises to stretch your calves and your plantar fascia.  A splint that holds your foot in a stretched, upward position while you sleep (night splint).  Physical therapy to relieve symptoms and prevent problems in the future.  Cortisone injections to relieve severe pain.  Extracorporeal shock wave therapy (ESWT) to stimulate damaged plantar fascia with electrical impulses. It is often  used as a last resort before surgery.  Surgery, if other treatments have not worked after 12 months. HOME CARE INSTRUCTIONS  Take medicines only as directed by your health care provider.  Avoid activities that cause pain.  Roll the bottom of your foot over a bag of ice or a bottle of cold water. Do this for 20 minutes, 3-4 times a day.  Perform simple stretches as directed by your health care provider.  Try wearing athletic shoes with air-sole or gel-sole cushions or soft shoe inserts.  Wear a night splint while sleeping, if directed by your health care provider.  Keep all follow-up appointments with your health care provider. PREVENTION   Do not perform exercises or activities that cause heel pain.  Consider finding low-impact activities if you continue to have problems.  Lose weight if you need to. The best way to prevent plantar fasciitis is to avoid the activities that aggravate your plantar fascia. SEEK MEDICAL CARE IF:  Your symptoms do not go away after treatment with home care measures.  Your pain gets worse.  Your pain affects your ability to  move or do your daily activities.   This information is not intended to replace advice given to you by your health care provider. Make sure you discuss any questions you have with your health care provider.   Document Released: 11/27/2000 Document Revised: 11/23/2014 Document Reviewed: 01/12/2014 Elsevier Interactive Patient Education [employer].

## 2015-06-06 ENCOUNTER — Encounter: Payer: Self-pay | Admitting: Internal Medicine

## 2015-06-19 ENCOUNTER — Encounter: Payer: Self-pay | Admitting: Internal Medicine

## 2015-06-19 ENCOUNTER — Ambulatory Visit (INDEPENDENT_AMBULATORY_CARE_PROVIDER_SITE_OTHER): Payer: Commercial Managed Care - HMO | Admitting: Internal Medicine

## 2015-06-19 VITALS — BP 122/70 | HR 70 | Temp 97.8°F | Resp 16 | Ht 65.0 in | Wt 172.0 lb

## 2015-06-19 DIAGNOSIS — I10 Essential (primary) hypertension: Secondary | ICD-10-CM

## 2015-06-19 DIAGNOSIS — M722 Plantar fascial fibromatosis: Secondary | ICD-10-CM

## 2015-06-19 MED ORDER — BENAZEPRIL-HYDROCHLOROTHIAZIDE 20-25 MG PO TABS: ORAL_TABLET | ORAL | Status: DC

## 2015-06-19 NOTE — Patient Instructions (Signed)
Please continue the meloxicam with your biggest meal for the next 2 weeks.  Please don't walk around bare foot and make sure you keep using your frozen water bottles.  If you are pain free for 2 weeks then you can transition to your regular shoes again.

## 2015-06-19 NOTE — Progress Notes (Signed)
Assessment and Plan:   1. Essential hypertension -BP well controlled -told her in future that if pharmacy has questions that they can contact the office instead of talking to her - benazepril-hydrochlorthiazide (LOTENSIN HCT) 20-25 MG tablet; Take 1/2 tablet to 1 tablet daily for HTN.  Dispense: 90 tablet; Refill: 1  2. Plantar fasciitis of left foot -appears to be improving -cont mobic for another 2 weeks -cont tennis shoes -cont iced water bottle   HPI 70 y.o.female presents for 2 week follow up of the left plantar fasciitis. Patient reports that they have been doing well.  female is taking their medication.  She reports that she has been wearing her sneakers and she has been using the iced water bottle.  They are not having difficulty with their medications.  They report no adverse reactions.   She has been taking it with food.    She did receive a call from [facility] and they reviewed her medications and told her she was not taking them properly.  She reported that she is taking them as we prescribed.    Past Medical History  Diagnosis Date  . Sarcoidosis ([place]) >70yr ago    no problems since  . DeQuervain's disease (tenosynovitis)   . Diverticulitis   . Bronchitis     hx of;>89yrago  . Headache(784.0)     related neck issues  . Dizziness     related to neck issues  . Chronic neck pain     arthritis  . History of shingles 2012  . Insomnia     d/t pain and takes Ativan nightly  . GERD (gastroesophageal reflux disease)     takes Ranitidine nightly  . Glaucoma   . High cholesterol     takes Lipitor daily  . Hypertension     takes Lotensin HCT daily  . [place]  . Asthma   . Degenerative disc disease     neck  . Frozen shoulder September 2012  . Hay fever   . Type II or unspecified type diabetes mellitus without mention of complication, not stated as uncontrolled     takes Metformin nightly  . Osteoporosis       Allergies  Allergen Reactions  . Penicillins Shortness Of Breath    And hives  . Aspirin Other (See Comments)    Stomach upset, but low dose is ok  . Sulfa Antibiotics Other (See Comments)    unknown  . Codeine Rash      Current Outpatient Prescriptions on File Prior to Visit  Medication Sig Dispense Refill  . ACCU-CHEK SOFTCLIX LANCETS lancets CHECK BLOOD GLUCOSE EVERY DAY 100 each 0  . acetaminophen (TYLENOL ARTHRITIS PAIN) 650 MG CR tablet Take 1,300 mg by mouth every 8 (eight) hours as needed. For pain    . albuterol (PROVENTIL HFA;VENTOLIN HFA) 108 (90 Base) MCG/ACT inhaler Inhale 2 puffs into the lungs every 6 (six) hours as needed for wheezing or shortness of breath (cough). 1 Inhaler 0  . alendronate (FOSAMAX) 70 MG tablet TAKE 1 TABLET ONCE A WEEK. TAKE WITH A FULL GLASS OF WATER ON AN EMPTY STOMACH. 12 tablet 3  . Ascorbic Acid (VITAMIN C PO) Take 1,000 mg by mouth daily.     . Marland Kitchenspirin EC 81 MG tablet Take 81 mg by mouth daily.    . Marland Kitchentorvastatin (LIPITOR) 40 MG tablet TAKE 1 TABLET EVERY DAY 90 tablet 1  . baclofen (LIORESAL) 10 MG tablet TAKE  1/2 TO 1 TABLET 2 TO 3 TIMES A DAY AS NEEDED FOR MUSCLE SPASM. 270 tablet 0  . benazepril-hydrochlorthiazide (LOTENSIN HCT) 20-25 MG tablet TAKE 1 TABLET EVERY DAY 90 tablet 1  . betaxolol (BETOPTIC-S) 0.25 % ophthalmic suspension Place 1 drop into both eyes 2 (two) times daily.    . Blood Glucose Monitoring Suppl (ACCU-CHEK AVIVA PLUS) W/DEVICE KIT USE AS DIRECTED 1 kit 0  . Brinzolamide-Brimonidine (SIMBRINZA) 1-0.2 % SUSP Apply to eye. 2 drops each daily    . CALCIUM PO Take 500 mg by mouth daily.     . diclofenac sodium (VOLTAREN) 1 % GEL APPLY  2  TO  4  GRAMS TOPICALLY FOUR TIMES DAILY 100 g 1  . gabapentin (NEURONTIN) 100 MG capsule TAKE 1 CAPSULE THREE TIMES DAILY 270 capsule 1  . glucose blood (ACCU-CHEK AVIVA PLUS) test strip Check blood glucose 1 time daily-DX-E11.22 100 each 12  . LORazepam (ATIVAN) 2 MG tablet TAKE 1/2  TO 1 TABLET THREE TIMES DAILY AS NEEDED FOR ANXIETY 270 tablet 1  . meloxicam (MOBIC) 15 MG tablet Take 1 tablet (15 mg total) by mouth daily. 90 tablet 0  . metFORMIN (GLUCOPHAGE-XR) 500 MG 24 hr tablet TAKE 1 TABLET AT BEDTIME 90 tablet 3  . Multiple Vitamin (MULTIVITAMIN WITH MINERALS) TABS tablet Take 1 tablet by mouth daily.    . ranitidine (ZANTAC) 300 MG tablet TAKE 1 TABLET AT BEDTIME 90 tablet 4   No current facility-administered medications on file prior to visit.    ROS: all negative except above.   Physical Exam: Filed Weights   06/19/15 0928  Weight: 172 lb (78.019 kg)   BP 122/70 mmHg  Pulse 70  Temp(Src) 97.8 F (36.6 C) (Temporal)  Resp 16  Ht 5' 5"  (1.651 m)  Wt 172 lb (78.019 kg)  BMI 28.62 kg/m2 General Appearance: Well developed well nourished, non-toxic appearing in no apparent distress. Eyes: PERRLA, EOMs, conjunctiva w/ no swelling or erythema or discharge Sinuses: No Frontal/maxillary tenderness ENT/Mouth: Ear canals clear without swelling or erythema.  TM's normal bilaterally with no retractions, bulging, or loss of landmarks.   Neck: Supple, thyroid normal, no notable JVD  Respiratory: Respiratory effort normal, Clear breath sounds anteriorly and posteriorly bilaterally without rales, rhonchi, wheezing or stridor. No retractions or accessory muscle usage. Cardio: RRR with no MRGs.   Abdomen: Soft, + BS.  Non tender, no guarding, rebound, hernias, masses.  Musculoskeletal: Full ROM, 5/5 strength, normal gait. Left foot with minimal tenderness to palpation at the insertion of the plantar fascia. Skin: Warm, dry without rashes  Neuro: Awake and oriented X 3, Cranial nerves intact. Normal muscle tone, no cerebellar symptoms. Sensation intact.  Psych: normal affect, Insight and Judgment appropriate.     Starlyn Skeans, PA-C 9:51 AM [facility]

## 2015-06-22 DIAGNOSIS — M542 Cervicalgia: Secondary | ICD-10-CM | POA: Diagnosis not present

## 2015-06-22 DIAGNOSIS — M4322 Fusion of spine, cervical region: Secondary | ICD-10-CM | POA: Diagnosis not present

## 2015-06-22 DIAGNOSIS — M5412 Radiculopathy, cervical region: Secondary | ICD-10-CM | POA: Diagnosis not present

## 2015-07-03 ENCOUNTER — Other Ambulatory Visit: Payer: Self-pay | Admitting: Orthopaedic Surgery

## 2015-07-03 ENCOUNTER — Other Ambulatory Visit: Payer: Self-pay | Admitting: Physician Assistant

## 2015-07-03 DIAGNOSIS — M5412 Radiculopathy, cervical region: Secondary | ICD-10-CM

## 2015-07-11 ENCOUNTER — Encounter: Payer: Self-pay | Admitting: Internal Medicine

## 2015-07-11 ENCOUNTER — Ambulatory Visit (INDEPENDENT_AMBULATORY_CARE_PROVIDER_SITE_OTHER): Payer: Commercial Managed Care - HMO | Admitting: Internal Medicine

## 2015-07-11 VITALS — BP 110/62 | HR 76 | Temp 98.2°F | Resp 16 | Ht 65.0 in | Wt 173.0 lb

## 2015-07-11 DIAGNOSIS — E1121 Type 2 diabetes mellitus with diabetic nephropathy: Secondary | ICD-10-CM | POA: Diagnosis not present

## 2015-07-11 DIAGNOSIS — J449 Chronic obstructive pulmonary disease, unspecified: Secondary | ICD-10-CM

## 2015-07-11 DIAGNOSIS — Z79899 Other long term (current) drug therapy: Secondary | ICD-10-CM

## 2015-07-11 DIAGNOSIS — Z0000 Encounter for general adult medical examination without abnormal findings: Secondary | ICD-10-CM

## 2015-07-11 DIAGNOSIS — I10 Essential (primary) hypertension: Secondary | ICD-10-CM

## 2015-07-11 DIAGNOSIS — E7800 Pure hypercholesterolemia, unspecified: Secondary | ICD-10-CM | POA: Diagnosis not present

## 2015-07-11 DIAGNOSIS — M47816 Spondylosis without myelopathy or radiculopathy, lumbar region: Secondary | ICD-10-CM

## 2015-07-11 DIAGNOSIS — J45909 Unspecified asthma, uncomplicated: Secondary | ICD-10-CM | POA: Diagnosis not present

## 2015-07-11 DIAGNOSIS — M533 Sacrococcygeal disorders, not elsewhere classified: Secondary | ICD-10-CM

## 2015-07-11 DIAGNOSIS — Z0001 Encounter for general adult medical examination with abnormal findings: Secondary | ICD-10-CM

## 2015-07-11 DIAGNOSIS — R6889 Other general symptoms and signs: Secondary | ICD-10-CM

## 2015-07-11 DIAGNOSIS — M7918 Myalgia, other site: Secondary | ICD-10-CM

## 2015-07-11 DIAGNOSIS — E559 Vitamin D deficiency, unspecified: Secondary | ICD-10-CM | POA: Diagnosis not present

## 2015-07-11 DIAGNOSIS — H409 Unspecified glaucoma: Secondary | ICD-10-CM

## 2015-07-11 DIAGNOSIS — M791 Myalgia: Secondary | ICD-10-CM

## 2015-07-11 DIAGNOSIS — K219 Gastro-esophageal reflux disease without esophagitis: Secondary | ICD-10-CM | POA: Diagnosis not present

## 2015-07-11 DIAGNOSIS — N182 Chronic kidney disease, stage 2 (mild): Secondary | ICD-10-CM

## 2015-07-11 DIAGNOSIS — E1129 Type 2 diabetes mellitus with other diabetic kidney complication: Secondary | ICD-10-CM | POA: Diagnosis not present

## 2015-07-11 DIAGNOSIS — Z6826 Body mass index (BMI) 26.0-26.9, adult: Secondary | ICD-10-CM

## 2015-07-11 DIAGNOSIS — M5030 Other cervical disc degeneration, unspecified cervical region: Secondary | ICD-10-CM

## 2015-07-11 DIAGNOSIS — J301 Allergic rhinitis due to pollen: Secondary | ICD-10-CM | POA: Diagnosis not present

## 2015-07-11 LAB — BASIC METABOLIC PANEL WITH GFR
BUN: 17 mg/dL (ref 7–25)
CALCIUM: 10 mg/dL (ref 8.6–10.4)
CO2: 31 mmol/L (ref 20–31)
Chloride: 103 mmol/L (ref 98–110)
Creat: 0.92 mg/dL (ref 0.50–0.99)
GFR, EST AFRICAN AMERICAN: 75 mL/min (ref >=60)
GFR, Est Non African American: 65 mL/min (ref >=60)
Glucose, Bld: 106 mg/dL — ABNORMAL HIGH (ref 65–99)
POTASSIUM: 4.6 mmol/L (ref 3.5–5.3)
Sodium: 140 mmol/L (ref 135–146)

## 2015-07-11 LAB — HEPATIC FUNCTION PANEL
ALBUMIN: 4.4 g/dL (ref 3.6–5.1)
ALK PHOS: 48 U/L (ref 33–130)
ALT: 17 U/L (ref 6–29)
AST: 16 U/L (ref 10–35)
BILIRUBIN TOTAL: 0.6 mg/dL (ref 0.2–1.2)
Bilirubin, Direct: 0.1 mg/dL (ref <=0.2)
Indirect Bilirubin: 0.5 mg/dL (ref 0.2–1.2)
Total Protein: 6.6 g/dL (ref 6.1–8.1)

## 2015-07-11 LAB — CBC WITH DIFFERENTIAL/PLATELET
BASOS PCT: 0 %
Basophils Absolute: 0 cells/uL (ref 0–200)
Eosinophils Absolute: 120 cells/uL (ref 15–500)
Eosinophils Relative: 3 %
HCT: 38.7 % (ref 35.0–45.0)
Hemoglobin: 12.5 g/dL (ref 11.7–15.5)
LYMPHS ABS: 1400 {cells}/uL (ref 850–3900)
Lymphocytes Relative: 35 %
MCH: 29.3 pg (ref 27.0–33.0)
MCHC: 32.3 g/dL (ref 32.0–36.0)
MCV: 90.8 fL (ref 80.0–100.0)
MONO ABS: 360 {cells}/uL (ref 200–950)
MPV: 9.6 fL (ref 7.5–12.5)
Monocytes Relative: 9 %
NEUTROS PCT: 53 %
Neutro Abs: 2120 cells/uL (ref 1500–7800)
PLATELETS: 240 10*3/uL (ref 140–400)
RBC: 4.26 MIL/uL (ref 3.80–5.10)
RDW: 14 % (ref 11.0–15.0)
WBC: 4 10*3/uL (ref 3.8–10.8)

## 2015-07-11 LAB — HEMOGLOBIN A1C
HEMOGLOBIN A1C: 6.4 % — AB (ref <5.7)
Mean Plasma Glucose: 137 mg/dL

## 2015-07-11 LAB — LIPID PANEL
CHOL/HDL RATIO: 2.6 ratio (ref <=5.0)
CHOLESTEROL: 180 mg/dL (ref 125–200)
HDL: 69 mg/dL (ref >=46)
LDL Cholesterol: 97 mg/dL (ref <130)
TRIGLYCERIDES: 69 mg/dL (ref <150)
VLDL: 14 mg/dL (ref <30)

## 2015-07-11 LAB — TSH: TSH: 1.13 mIU/L

## 2015-07-11 NOTE — Progress Notes (Signed)
Patient ID: Sara [name], female   DOB: [birth year], 70 y.o.   MRN: [id number] MEDICARE ANNUAL WELLNESS VISIT AND FOLLOW UP Assessment:    1. Essential hypertension -DASH Diet -cont meds -monitor at home - TSH  2. Type 2 diabetes mellitus with diabetic nephropathy, without long-term current use of insulin (HCC) -cont diet and exercise -start water aerobics - Hemoglobin A1c  3. Hyperlipidemia -cont meds -diet and exercise - Lipid panel  4. Vitamin D deficiency -cont supplement  5. Medication management  - CBC with Differential/Platelet - BASIC METABOLIC PANEL WITH GFR - Hepatic function panel  6. Chronic obstructive pulmonary disease, unspecified COPD type ([place]) -cont inhalers  7. Asthma, unspecified asthma severity, uncomplicated -cont inhalers  8. Hay fever -cont antihistamines prn  9. Gastroesophageal reflux disease, esophagitis presence not specified -cont meds -avoid trigger foods  10. Disorder of sacroiliac joint -followed by Dr. Patrice Paradise  11. Myofascial pain syndrome -exercise and diet  12. Lumbar spondylosis, unspecified spinal osteoarthritis -followed by Dr. Patrice Paradise  13. Disc disease, degenerative, cervical -followed by Dr. Patrice Paradise  14. CKD (chronic kidney disease) stage 2, GFR 60-89 ml/min -drink plenty of water -avoid NSAID use  15. Glaucoma -followed by opthalmology  16. BMI 26.43,  adult -diet and exercise  Wellness due next year.      Over 30 minutes of exam, counseling, chart review, and critical decision making was performed  Plan:   During the course of the visit the patient was educated and counseled about appropriate screening and preventive services including:    Pneumococcal vaccine   Influenza vaccine  Prevnar 13  Td vaccine  Screening electrocardiogram  Colorectal cancer screening  Diabetes screening  Glaucoma screening  Nutrition counseling   Conditions/risks identified: BMI: Discussed weight loss, diet,  and increase physical activity.  Increase physical activity: AHA recommends 150 minutes of physical activity a week.  Medications reviewed Diabetes is at goal, ACE/ARB therapy: Yes. Urinary Incontinence is not an issue: discussed non pharmacology and pharmacology options.  Fall risk: low- discussed PT, home fall assessment, medications.    Subjective:  Sara [name] is a 69 y.o. female who presents for Medicare Annual Wellness Visit and 3 month follow up for HTN, hyperlipidemia, prediabetes, and vitamin D Def.  Date of last medicare wellness visit was is 9/15.  Her blood pressure has been controlled at home, today their BP is BP: 110/62 mmHg She does not workout. She denies chest pain, shortness of breath, dizziness.  She is unable to exercise right now secondary to plantar fasciitis but she is about to start doing water aerobics.  She is also starting a diet program through her insurance.    She is on cholesterol medication and denies myalgias. Her cholesterol is at goal. The cholesterol last visit was:   Lab Results  Component Value Date   CHOL 176 04/12/2015   HDL 64 04/12/2015   LDLCALC 93 04/12/2015   TRIG 97 04/12/2015   CHOLHDL 2.8 04/12/2015   She has been working on diet and exercise for prediabetes, and denies foot ulcerations, hyperglycemia, hypoglycemia , increased appetite, nausea, paresthesia of the feet, polydipsia, polyuria, visual disturbances, vomiting and weight loss. Last A1C in the office was:  Lab Results  Component Value Date   HGBA1C 6.4* 04/12/2015   Last GFR Lab Results  Component Value Date   GFRNONAA 77 04/12/2015     Lab Results  Component Value Date   GFRAA 88 04/12/2015   Patient is on Vitamin D supplement.  Lab Results  Component Value Date   VD25OH 51 01/04/2015     Her plantar fasciitis has been improving.  She is still taking the meloxicam daily but would like to try to go down to as needed.    She is also seeing Dr. Patrice Paradise for her neck  and is having a CT and MRI of her neck.  She is due to go back to him 07/26/15.  Medication Review: Current Outpatient Prescriptions on File Prior to Visit  Medication Sig Dispense Refill  . ACCU-CHEK SOFTCLIX LANCETS lancets CHECK BLOOD GLUCOSE EVERY DAY 100 each 0  . acetaminophen (TYLENOL ARTHRITIS PAIN) 650 MG CR tablet Take 1,300 mg by mouth every 8 (eight) hours as needed. For pain    . albuterol (PROVENTIL HFA;VENTOLIN HFA) 108 (90 Base) MCG/ACT inhaler Inhale 2 puffs into the lungs every 6 (six) hours as needed for wheezing or shortness of breath (cough). 1 Inhaler 0  . alendronate (FOSAMAX) 70 MG tablet TAKE 1 TABLET ONCE A WEEK. TAKE WITH A FULL GLASS OF WATER ON AN EMPTY STOMACH. 12 tablet 3  . Ascorbic Acid (VITAMIN C PO) Take 1,000 mg by mouth daily.     Marland Kitchen aspirin EC 81 MG tablet Take 81 mg by mouth daily.    Marland Kitchen atorvastatin (LIPITOR) 40 MG tablet TAKE 1 TABLET EVERY DAY 90 tablet 1  . baclofen (LIORESAL) 10 MG tablet TAKE 1/2 TO 1 TABLET 2 TO 3 TIMES A DAY AS NEEDED FOR MUSCLE SPASM. 270 tablet 0  . benazepril-hydrochlorthiazide (LOTENSIN HCT) 20-25 MG tablet Take 1/2 tablet to 1 tablet daily for HTN. 90 tablet 1  . betaxolol (BETOPTIC-S) 0.25 % ophthalmic suspension Place 1 drop into both eyes 2 (two) times daily.    . Blood Glucose Monitoring Suppl (ACCU-CHEK AVIVA PLUS) W/DEVICE KIT USE AS DIRECTED 1 kit 0  . Brinzolamide-Brimonidine (SIMBRINZA) 1-0.2 % SUSP Apply to eye. 2 drops each daily    . CALCIUM PO Take 500 mg by mouth daily.     . COD LIVER OIL PO Take by mouth daily.    . diclofenac sodium (VOLTAREN) 1 % GEL APPLY  2  TO  4  GRAMS TOPICALLY FOUR TIMES DAILY 100 g 1  . gabapentin (NEURONTIN) 100 MG capsule TAKE 1 CAPSULE THREE TIMES DAILY 270 capsule 1  . glucose blood (ACCU-CHEK AVIVA PLUS) test strip Check blood glucose 1 time daily-DX-E11.22 100 each 12  . LORazepam (ATIVAN) 2 MG tablet TAKE 1/2 TO 1 TABLET THREE TIMES DAILY AS NEEDED FOR ANXIETY 270 tablet 1  .  meloxicam (MOBIC) 15 MG tablet Take 1 tablet (15 mg total) by mouth daily. 90 tablet 0  . metFORMIN (GLUCOPHAGE-XR) 500 MG 24 hr tablet TAKE 1 TABLET AT BEDTIME 90 tablet 3  . Multiple Vitamin (MULTIVITAMIN WITH MINERALS) TABS tablet Take 1 tablet by mouth daily.    . ranitidine (ZANTAC) 300 MG tablet TAKE 1 TABLET AT BEDTIME 90 tablet 4   No current facility-administered medications on file prior to visit.    Current Problems (verified) Patient Active Problem List   Diagnosis Date Noted  . BMI 26.43,  adult 01/04/2015  . CKD (chronic kidney disease) stage 2, GFR 60-89 ml/min 11/16/2013  . Vitamin D deficiency 06/04/2013  . Medication management 06/04/2013  . Type II diabetes mellitus with renal manifestations ([place])   . GERD (gastroesophageal reflux disease)   . Glaucoma   . Hyperlipidemia   . Hypertension   . COPD (chronic obstructive pulmonary disease) (  [place])   . Unspecified asthma(493.90)   . Degenerative disc disease   . Hay fever   . Lumbar spondylosis 03/27/2012  . Disorder of sacroiliac joint 05/31/2011  . Myofascial pain syndrome 05/31/2011    Screening Tests Immunization History  Administered Date(s) Administered  . Influenza, High Dose Seasonal PF 01/04/2015  . Pneumococcal Conjugate-13 11/16/2013  . Pneumococcal Polysaccharide-23 04/12/2015  . Pneumococcal-Unspecified 03/18/2005  . Tdap 03/18/2009  . Zoster 11/13/2012    Preventative care: Last colonoscopy: 2009  Prior vaccinations: TD or Tdap: 2011  Influenza: 2016 Pneumococcal: 2017 Prevnar13: 2015 Shingles/Zostavax: 2014  Names of Other Physician/Practitioners you currently use: 1. [facility] here for primary care 2. Dr. Ellie Lunch, eye doctor, last visit 2016 3. Dr. Georgie Chard, dentist, last visit 2016 Patient Care Team: Unk Pinto, MD as PCP - General (Internal Medicine) Normajean Glasgow, MD as Attending Physician (Physical Medicine and Rehabilitation) Phylliss Bob,  MD as Consulting Physician (Orthopedic Surgery) Juanita Craver, MD as Consulting Physician (Gastroenterology)  Past Surgical History  Procedure Laterality Date  . Carpal tunnel release  2012  . Wrist surgery      ORIF left wrist and then removed d/t protruding screw in 2012  . Breast surgery  20+yrs ago    left breast;benign  . Tonsillectomy  30+yrs ago  . Abdominal hysterectomy  1975  . Refractive surgery  2011    bilateral  . Colonoscopy    . Anterior cervical decomp/discectomy fusion  07/31/2011    Procedure: ANTERIOR CERVICAL DECOMPRESSION/DISCECTOMY FUSION 3 LEVELS;  Surgeon: Hosie Spangle, MD;  Location: [facility];  Service: Neurosurgery;  Laterality: N/A;  Cervical four-five,Cervical five-six,Cervical six-seven anterior cervical decompression with fusion plating and bonegraft   Family History  Problem Relation Age of Onset  . Cancer Mother 64    breast cancer  . Pneumonia Father   . Leukemia Father   . Glaucoma Father   . Anesthesia problems Neg Hx   . Hypotension Neg Hx   . Malignant hyperthermia Neg Hx   . Pseudochol deficiency Neg Hx   . Mitral valve prolapse Sister   . Hypertension Sister   . HIV Son    Social History  Substance Use Topics  . Smoking status: Never Smoker   . Smokeless tobacco: Never Used  . Alcohol Use: No   Allergies  Allergen Reactions  . Penicillins Shortness Of Breath    And hives  . Aspirin Other (See Comments)    Stomach upset, but low dose is ok  . Sulfa Antibiotics Other (See Comments)    unknown  . Codeine Rash    MEDICARE WELLNESS OBJECTIVES: Tobacco use: She does not smoke.  Patient is a former smoker. If yes, counseling given Alcohol Current alcohol use: social drinker Diet: well balanced Physical activity:   Cardiac risk factors:   Depression/mood screen:   Depression screen [facility] 2/9 09/23/2014  Decreased Interest 0  Down, Depressed, Hopeless 0  PHQ - 2 Score 0    ADLs:  In your present state of health, do you  have any difficulty performing the following activities: 09/23/2014  Hearing? N  Vision? N  Difficulty concentrating or making decisions? N  Walking or climbing stairs? N  Dressing or bathing? N  Doing errands, shopping? N  Preparing Food and eating ? N  Using the Toilet? N  In the past six months, have you accidently leaked urine? N  Do you have problems with loss of bowel control? N  Managing  your Medications? N  Managing your Finances? N  Housekeeping or managing your Housekeeping? N     Cognitive Testing  Alert? Yes  Normal Appearance?Yes  Oriented to person? Yes  Place? Yes   Time? Yes  Recall of three objects?  Yes  Can perform simple calculations? Yes  Displays appropriate judgment?Yes  Can read the correct time from a watch face?Yes  EOL planning:     Objective:   Today's Vitals   07/11/15 1047  BP: 110/62  Pulse: 76  Temp: 98.2 F (36.8 C)  TempSrc: Temporal  Resp: 16  Height: 5' 5"  (1.651 m)  Weight: 173 lb (78.472 kg)   Body mass index is 28.79 kg/(m^2).  General appearance: alert, no distress, WD/WN, female HEENT: normocephalic, sclerae anicteric, TMs pearly, nares patent, no discharge or erythema, pharynx normal Oral cavity: MMM, no lesions Neck: supple, no lymphadenopathy, no thyromegaly, no masses Heart: RRR, normal S1, S2, no murmurs Lungs: CTA bilaterally, no wheezes, rhonchi, or rales Abdomen: +bs, soft, non tender, non distended, no masses, no hepatomegaly, no splenomegaly Musculoskeletal: nontender, no swelling, no obvious deformity Extremities: no edema, no cyanosis, no clubbing Pulses: 2+ symmetric, upper and lower extremities, normal cap refill Neurological: alert, oriented x 3, CN2-12 intact, strength normal upper extremities and lower extremities, sensation normal throughout, DTRs 2+ throughout, no cerebellar signs, gait normal Psychiatric: normal affect, behavior normal, pleasant   Medicare Attestation I have personally reviewed: The  patient's medical and social history Their use of alcohol, tobacco or illicit drugs Their current medications and supplements The patient's functional ability including ADLs,fall risks, home safety risks, cognitive, and hearing and visual impairment Diet and physical activities Evidence for depression or mood disorders  The patient's weight, height, BMI, and visual acuity have been recorded in the chart.  I have made referrals, counseling, and provided education to the patient based on review of the above and I have provided the patient with a written personalized care plan for preventive services.     Starlyn Skeans, PA-C   07/11/2015

## 2015-07-11 NOTE — Patient Instructions (Signed)
Please stop the meloxicam daily and take as needed.  If the foot really starts bothering you a lot please call me and I will send you to see an orthopedist for a possible injections.  Keep working hard on the diet.

## 2015-07-11 NOTE — Progress Notes (Deleted)
Assessment and Plan:  Hypertension:  -Continue medication,  -monitor blood pressure at home.  -Continue DASH diet.   -Reminder to go to the ER if any CP, SOB, nausea, dizziness, severe HA, changes vision/speech, left arm numbness and tingling, and jaw pain.  Cholesterol: -Continue diet and exercise.  -Check cholesterol.   Pre-diabetes: -Continue diet and exercise.  -Check A1C  Vitamin D Def: -check level -continue medications.   Continue diet and meds as discussed. Further disposition pending results of labs.  HPI 70 y.o. female  presents for 3 month follow up with hypertension, hyperlipidemia, prediabetes and vitamin D.   Her blood pressure has been controlled at home, today their BP is BP: 110/62 mmHg.   She does workout. She denies chest pain, shortness of breath, dizziness.  She is not walking as much as she has been because she has been having some issues with her plantar fasciitis.     She is on cholesterol medication and denies myalgias. Her cholesterol is at goal. The cholesterol last visit was:   Lab Results  Component Value Date   CHOL 176 04/12/2015   HDL 64 04/12/2015   LDLCALC 93 04/12/2015   TRIG 97 04/12/2015   CHOLHDL 2.8 04/12/2015     She has been working on diet and exercise for prediabetes, and denies foot ulcerations, hyperglycemia, hypoglycemia , increased appetite, nausea, paresthesia of the feet, polydipsia, polyuria, visual disturbances, vomiting and weight loss. Last A1C in the office was:  Lab Results  Component Value Date   HGBA1C 6.4* 04/12/2015    Patient is on Vitamin D supplement.  Lab Results  Component Value Date   VD25OH 51 01/04/2015     She did end up going to see Dr. Rennis Harding about her spine issues.  She reports that she is due to go back and see him on May the 10th.  She is due to get a CT scan and an MRI.  She reports that she did like him and was comfortable with him.    Current Medications:  Current Outpatient Prescriptions  on File Prior to Visit  Medication Sig Dispense Refill  . ACCU-CHEK SOFTCLIX LANCETS lancets CHECK BLOOD GLUCOSE EVERY DAY 100 each 0  . acetaminophen (TYLENOL ARTHRITIS PAIN) 650 MG CR tablet Take 1,300 mg by mouth every 8 (eight) hours as needed. For pain    . albuterol (PROVENTIL HFA;VENTOLIN HFA) 108 (90 Base) MCG/ACT inhaler Inhale 2 puffs into the lungs every 6 (six) hours as needed for wheezing or shortness of breath (cough). 1 Inhaler 0  . alendronate (FOSAMAX) 70 MG tablet TAKE 1 TABLET ONCE A WEEK. TAKE WITH A FULL GLASS OF WATER ON AN EMPTY STOMACH. 12 tablet 3  . Ascorbic Acid (VITAMIN C PO) Take 1,000 mg by mouth daily.     Marland Kitchen aspirin EC 81 MG tablet Take 81 mg by mouth daily.    Marland Kitchen atorvastatin (LIPITOR) 40 MG tablet TAKE 1 TABLET EVERY DAY 90 tablet 1  . baclofen (LIORESAL) 10 MG tablet TAKE 1/2 TO 1 TABLET 2 TO 3 TIMES A DAY AS NEEDED FOR MUSCLE SPASM. 270 tablet 0  . benazepril-hydrochlorthiazide (LOTENSIN HCT) 20-25 MG tablet Take 1/2 tablet to 1 tablet daily for HTN. 90 tablet 1  . betaxolol (BETOPTIC-S) 0.25 % ophthalmic suspension Place 1 drop into both eyes 2 (two) times daily.    . Blood Glucose Monitoring Suppl (ACCU-CHEK AVIVA PLUS) W/DEVICE KIT USE AS DIRECTED 1 kit 0  . Brinzolamide-Brimonidine (SIMBRINZA) 1-0.2 %  SUSP Apply to eye. 2 drops each daily    . CALCIUM PO Take 500 mg by mouth daily.     . COD LIVER OIL PO Take by mouth daily.    . diclofenac sodium (VOLTAREN) 1 % GEL APPLY  2  TO  4  GRAMS TOPICALLY FOUR TIMES DAILY 100 g 1  . gabapentin (NEURONTIN) 100 MG capsule TAKE 1 CAPSULE THREE TIMES DAILY 270 capsule 1  . glucose blood (ACCU-CHEK AVIVA PLUS) test strip Check blood glucose 1 time daily-DX-E11.22 100 each 12  . LORazepam (ATIVAN) 2 MG tablet TAKE 1/2 TO 1 TABLET THREE TIMES DAILY AS NEEDED FOR ANXIETY 270 tablet 1  . meloxicam (MOBIC) 15 MG tablet Take 1 tablet (15 mg total) by mouth daily. 90 tablet 0  . metFORMIN (GLUCOPHAGE-XR) 500 MG 24 hr tablet  TAKE 1 TABLET AT BEDTIME 90 tablet 3  . Multiple Vitamin (MULTIVITAMIN WITH MINERALS) TABS tablet Take 1 tablet by mouth daily.    . ranitidine (ZANTAC) 300 MG tablet TAKE 1 TABLET AT BEDTIME 90 tablet 4   No current facility-administered medications on file prior to visit.    Medical History:  Past Medical History  Diagnosis Date  . Sarcoidosis ([place]) >36yr ago    no problems since  . DeQuervain's disease (tenosynovitis)   . Diverticulitis   . Bronchitis     hx of;>89yrago  . Headache(784.0)     related neck issues  . Dizziness     related to neck issues  . Chronic neck pain     arthritis  . History of shingles 2012  . Insomnia     d/t pain and takes Ativan nightly  . GERD (gastroesophageal reflux disease)     takes Ranitidine nightly  . Glaucoma   . High cholesterol     takes Lipitor daily  . Hypertension     takes Lotensin HCT daily  . COPD (chronic obstructive pulmonary disease) ([place]  . Asthma   . Degenerative disc disease     neck  . Frozen shoulder September 2012  . Hay fever   . Type II or unspecified type diabetes mellitus without mention of complication, not stated as uncontrolled     takes Metformin nightly  . Osteoporosis     Allergies:  Allergies  Allergen Reactions  . Penicillins Shortness Of Breath    And hives  . Aspirin Other (See Comments)    Stomach upset, but low dose is ok  . Sulfa Antibiotics Other (See Comments)    unknown  . Codeine Rash     Review of Systems:  Review of Systems  Constitutional: Negative for fever, chills and malaise/fatigue.  HENT: Negative for congestion, ear pain and sore throat.   Eyes: Negative.   Respiratory: Negative for cough, shortness of breath and wheezing.   Cardiovascular: Negative for chest pain, palpitations and leg swelling.  Gastrointestinal: Negative for heartburn, abdominal pain, diarrhea, constipation, blood in stool and melena.  Genitourinary: Negative.   Skin: Negative.   Neurological:  Negative for dizziness, sensory change, loss of consciousness and headaches.  Psychiatric/Behavioral: Negative for depression. The patient is not nervous/anxious and does not have insomnia.     Family history- Review and unchanged  Social history- Review and unchanged  Physical Exam: BP 110/62 mmHg  Pulse 76  Temp(Src) 98.2 F (36.8 C) (Temporal)  Resp 16  Ht 5' 5"  (1.651 m)  Wt 173 lb (78.472 kg)  BMI 28.79 kg/m2 Wt Readings from Last  3 Encounters:  07/11/15 173 lb (78.472 kg)  06/19/15 172 lb (78.019 kg)  06/05/15 172 lb (78.019 kg)    General Appearance: Well nourished well developed, in no apparent distress. Eyes: PERRLA, EOMs, conjunctiva no swelling or erythema ENT/Mouth: Ear canals normal without obstruction, swelling, erythma, discharge.  TMs normal bilaterally.  Oropharynx moist, clear, without exudate, or postoropharyngeal swelling. Neck: Supple, thyroid normal,no cervical adenopathy  Respiratory: Respiratory effort normal, Breath sounds clear A&P without rhonchi, wheeze, or rale.  No retractions, no accessory usage. Cardio: RRR with no MRGs. Brisk peripheral pulses without edema.  Abdomen: Soft, + BS,  Non tender, no guarding, rebound, hernias, masses. Musculoskeletal: Full ROM, 5/5 strength, Normal gait Skin: Warm, dry without rashes, lesions, ecchymosis.  Neuro: Awake and oriented X 3, Cranial nerves intact. Normal muscle tone, no cerebellar symptoms. Psych: Normal affect, Insight and Judgment appropriate.    Starlyn Skeans, PA-C 11:17 AM [facility]

## 2015-07-18 ENCOUNTER — Other Ambulatory Visit: Payer: Self-pay

## 2015-07-18 ENCOUNTER — Inpatient Hospital Stay: Admission: RE | Admit: 2015-07-18 | Payer: Self-pay | Source: Ambulatory Visit

## 2015-07-19 ENCOUNTER — Encounter: Payer: Self-pay | Admitting: Internal Medicine

## 2015-07-19 ENCOUNTER — Other Ambulatory Visit: Payer: Self-pay | Admitting: Internal Medicine

## 2015-07-19 DIAGNOSIS — M722 Plantar fascial fibromatosis: Secondary | ICD-10-CM

## 2015-07-25 ENCOUNTER — Other Ambulatory Visit: Payer: Self-pay

## 2015-07-25 ENCOUNTER — Ambulatory Visit
Admission: RE | Admit: 2015-07-25 | Discharge: 2015-07-25 | Disposition: A | Discharge status: Home / Self Care | Payer: Commercial Managed Care - HMO | Source: Ambulatory Visit | Attending: Orthopaedic Surgery | Admitting: Orthopaedic Surgery

## 2015-07-25 DIAGNOSIS — M5023 Other cervical disc displacement, cervicothoracic region: Secondary | ICD-10-CM | POA: Diagnosis not present

## 2015-07-25 DIAGNOSIS — M5412 Radiculopathy, cervical region: Secondary | ICD-10-CM

## 2015-07-26 ENCOUNTER — Other Ambulatory Visit: Payer: Self-pay

## 2015-07-26 DIAGNOSIS — M4322 Fusion of spine, cervical region: Secondary | ICD-10-CM | POA: Diagnosis not present

## 2015-07-26 DIAGNOSIS — M5412 Radiculopathy, cervical region: Secondary | ICD-10-CM | POA: Diagnosis not present

## 2015-07-26 DIAGNOSIS — M7552 Bursitis of left shoulder: Secondary | ICD-10-CM | POA: Diagnosis not present

## 2015-08-02 ENCOUNTER — Other Ambulatory Visit: Payer: Self-pay | Admitting: Internal Medicine

## 2015-08-31 ENCOUNTER — Other Ambulatory Visit: Payer: Self-pay | Admitting: Internal Medicine

## 2015-08-31 DIAGNOSIS — M5412 Radiculopathy, cervical region: Secondary | ICD-10-CM | POA: Diagnosis not present

## 2015-08-31 DIAGNOSIS — M7552 Bursitis of left shoulder: Secondary | ICD-10-CM | POA: Diagnosis not present

## 2015-08-31 DIAGNOSIS — M4322 Fusion of spine, cervical region: Secondary | ICD-10-CM | POA: Diagnosis not present

## 2015-09-25 ENCOUNTER — Encounter: Payer: Self-pay | Admitting: Internal Medicine

## 2015-09-25 ENCOUNTER — Other Ambulatory Visit: Payer: Self-pay | Admitting: Internal Medicine

## 2015-10-02 DIAGNOSIS — H2513 Age-related nuclear cataract, bilateral: Secondary | ICD-10-CM | POA: Diagnosis not present

## 2015-10-02 DIAGNOSIS — H401133 Primary open-angle glaucoma, bilateral, severe stage: Secondary | ICD-10-CM | POA: Diagnosis not present

## 2015-10-05 ENCOUNTER — Other Ambulatory Visit: Payer: Self-pay | Admitting: Internal Medicine

## 2015-10-06 ENCOUNTER — Encounter: Payer: Self-pay | Admitting: Internal Medicine

## 2015-10-09 ENCOUNTER — Other Ambulatory Visit: Payer: Self-pay | Admitting: Internal Medicine

## 2015-10-10 ENCOUNTER — Ambulatory Visit (INDEPENDENT_AMBULATORY_CARE_PROVIDER_SITE_OTHER): Payer: Commercial Managed Care - HMO | Admitting: Internal Medicine

## 2015-10-10 ENCOUNTER — Encounter: Payer: Self-pay | Admitting: Internal Medicine

## 2015-10-10 VITALS — BP 106/62 | HR 74 | Temp 98.0°F | Resp 18 | Ht 65.0 in | Wt 175.0 lb

## 2015-10-10 DIAGNOSIS — Z79899 Other long term (current) drug therapy: Secondary | ICD-10-CM | POA: Diagnosis not present

## 2015-10-10 DIAGNOSIS — H409 Unspecified glaucoma: Secondary | ICD-10-CM

## 2015-10-10 DIAGNOSIS — Z6826 Body mass index (BMI) 26.0-26.9, adult: Secondary | ICD-10-CM

## 2015-10-10 DIAGNOSIS — N182 Chronic kidney disease, stage 2 (mild): Secondary | ICD-10-CM | POA: Diagnosis not present

## 2015-10-10 DIAGNOSIS — E559 Vitamin D deficiency, unspecified: Secondary | ICD-10-CM

## 2015-10-10 DIAGNOSIS — Z1159 Encounter for screening for other viral diseases: Secondary | ICD-10-CM | POA: Diagnosis not present

## 2015-10-10 DIAGNOSIS — E7800 Pure hypercholesterolemia, unspecified: Secondary | ICD-10-CM | POA: Diagnosis not present

## 2015-10-10 DIAGNOSIS — I10 Essential (primary) hypertension: Secondary | ICD-10-CM

## 2015-10-10 DIAGNOSIS — K219 Gastro-esophageal reflux disease without esophagitis: Secondary | ICD-10-CM

## 2015-10-10 DIAGNOSIS — E1121 Type 2 diabetes mellitus with diabetic nephropathy: Secondary | ICD-10-CM | POA: Diagnosis not present

## 2015-10-10 DIAGNOSIS — Z0000 Encounter for general adult medical examination without abnormal findings: Secondary | ICD-10-CM

## 2015-10-10 DIAGNOSIS — J449 Chronic obstructive pulmonary disease, unspecified: Secondary | ICD-10-CM

## 2015-10-10 LAB — CBC WITH DIFFERENTIAL/PLATELET
Basophils Absolute: 0 cells/uL (ref 0–200)
Basophils Relative: 0 %
Eosinophils Absolute: 76 cells/uL (ref 15–500)
Eosinophils Relative: 2 %
HEMATOCRIT: 39.3 % (ref 35.0–45.0)
Hemoglobin: 12.6 g/dL (ref 11.7–15.5)
LYMPHS PCT: 45 %
Lymphs Abs: 1710 cells/uL (ref 850–3900)
MCH: 29.4 pg (ref 27.0–33.0)
MCHC: 32.1 g/dL (ref 32.0–36.0)
MCV: 91.8 fL (ref 80.0–100.0)
MONO ABS: 342 {cells}/uL (ref 200–950)
MONOS PCT: 9 %
MPV: 9.2 fL (ref 7.5–12.5)
NEUTROS PCT: 44 %
Neutro Abs: 1672 cells/uL (ref 1500–7800)
Platelets: 242 10*3/uL (ref 140–400)
RBC: 4.28 MIL/uL (ref 3.80–5.10)
RDW: 14.4 % (ref 11.0–15.0)
WBC: 3.8 10*3/uL (ref 3.8–10.8)

## 2015-10-10 LAB — BASIC METABOLIC PANEL WITH GFR
BUN: 14 mg/dL (ref 7–25)
CALCIUM: 10.1 mg/dL (ref 8.6–10.4)
CO2: 30 mmol/L (ref 20–31)
Chloride: 102 mmol/L (ref 98–110)
Creat: 0.85 mg/dL (ref 0.50–0.99)
GFR, EST AFRICAN AMERICAN: 82 mL/min (ref >=60)
GFR, Est Non African American: 71 mL/min (ref >=60)
GLUCOSE: 112 mg/dL — AB (ref 65–99)
Potassium: 4.3 mmol/L (ref 3.5–5.3)
Sodium: 138 mmol/L (ref 135–146)

## 2015-10-10 LAB — TSH: TSH: 1.44 mIU/L

## 2015-10-10 LAB — HEPATIC FUNCTION PANEL
ALBUMIN: 4.4 g/dL (ref 3.6–5.1)
ALT: 19 U/L (ref 6–29)
AST: 20 U/L (ref 10–35)
Alkaline Phosphatase: 43 U/L (ref 33–130)
Bilirubin, Direct: 0.1 mg/dL (ref <=0.2)
Indirect Bilirubin: 0.5 mg/dL (ref 0.2–1.2)
TOTAL PROTEIN: 6.9 g/dL (ref 6.1–8.1)
Total Bilirubin: 0.6 mg/dL (ref 0.2–1.2)

## 2015-10-10 LAB — LIPID PANEL
CHOLESTEROL: 177 mg/dL (ref 125–200)
HDL: 74 mg/dL (ref >=46)
LDL Cholesterol: 87 mg/dL (ref <130)
TRIGLYCERIDES: 80 mg/dL (ref <150)
Total CHOL/HDL Ratio: 2.4 Ratio (ref <=5.0)
VLDL: 16 mg/dL (ref <30)

## 2015-10-10 LAB — MAGNESIUM: MAGNESIUM: 2.1 mg/dL (ref 1.5–2.5)

## 2015-10-10 NOTE — Progress Notes (Signed)
Complete Physical  Assessment and Plan:   1. Essential hypertension -cont meds -dash diet -well controlled -exercise as tolerated - EKG 12-Lead - TSH  2. Chronic obstructive pulmonary disease, unspecified COPD type ([place]) - albuterol prn  - DG Chest 2 View; Future  3. Gastroesophageal reflux disease, esophagitis presence not specified -cont ranitidine -well controlled  4. Type 2 diabetes mellitus with diabetic nephropathy, without long-term current use of insulin (HCC) -cont metformin -A1C previously well controlled -cont diet and exercise as tolerated - Hemoglobin A1c - Insulin, random  5. CKD (chronic kidney disease) stage 2, GFR 60-89 ml/min -avoid NSAIDs - Urinalysis, Routine w reflex microscopic (not at [facility]) - Microalbumin / creatinine urine ratio  6. Hyperlipidemia -cont diet and exercise - Lipid panel  7. Vitamin D deficiency -cont supplement - VITAMIN D 25 Hydroxy (Vit-D Deficiency, Fractures)  8. Medication management  - CBC with Differential/Platelet - BASIC METABOLIC PANEL WITH GFR - Hepatic function panel - Magnesium  9. Glaucoma -cont drops -follow-up with opthalmology  10. BMI 26.43,  adult -cont diet and exercise  11. Need for hepatitis C screening test  - Hepatitis C antibody   Discussed med's effects and SE's. Screening labs and tests as requested with regular follow-up as recommended.  HPI  70 y.o. female  presents for a complete physical.  Her blood pressure has been controlled at home, today their BP is BP: 106/62.  She does workout. She denies chest pain, shortness of breath, dizziness.  She is doing water aerobics currently.  She reports that she is doing classes twice weekly and sometimes three times per week.  She is really enjoying it.    She is on cholesterol medication and denies myalgias. Her cholesterol is at goal. The cholesterol last visit was:  Lab Results  Component Value Date   CHOL 180 07/11/2015   HDL 69  07/11/2015   LDLCALC 97 07/11/2015   TRIG 69 07/11/2015   CHOLHDL 2.6 07/11/2015  .  She has been working on diet and exercise for diabetes, she is on bASA, she is on ACE/ARB and denies foot ulcerations, hyperglycemia, hypoglycemia , increased appetite, nausea, paresthesia of the feet, polydipsia, polyuria, visual disturbances, vomiting and weight loss. Last A1C in the office was:  Lab Results  Component Value Date   HGBA1C 6.4 (H) 07/11/2015    Patient is on Vitamin D supplement.   Lab Results  Component Value Date   VD25OH 51 01/04/2015     She is due to go and see Dr. Juline Patch.  She reports that her plantar fasciitis is irritated from the water aerobics.  She reports that she has not heard about an appointment yet.    She is still fosamax for her osteoporosis.  Her last DEXA scan was in 2016.     Current Medications:  Current Outpatient Prescriptions on File Prior to Visit  Medication Sig Dispense Refill  . ACCU-CHEK SOFTCLIX LANCETS lancets CHECK BLOOD GLUCOSE EVERY DAY 100 each 0  . acetaminophen (TYLENOL ARTHRITIS PAIN) 650 MG CR tablet Take 1,300 mg by mouth every 8 (eight) hours as needed. For pain    . albuterol (PROVENTIL HFA;VENTOLIN HFA) 108 (90 Base) MCG/ACT inhaler Inhale 2 puffs into the lungs every 6 (six) hours as needed for wheezing or shortness of breath (cough). 1 Inhaler 0  . alendronate (FOSAMAX) 70 MG tablet TAKE 1 TABLET ONCE A WEEK. TAKE WITH A FULL GLASS OF WATER ON AN EMPTY STOMACH. 12 tablet 3  . Ascorbic Acid (  VITAMIN C PO) Take 1,000 mg by mouth daily.     Marland Kitchen aspirin EC 81 MG tablet Take 81 mg by mouth daily.    Marland Kitchen atorvastatin (LIPITOR) 40 MG tablet TAKE 1 TABLET EVERY DAY 90 tablet 1  . baclofen (LIORESAL) 10 MG tablet TAKE 1/2 TO 1 TABLET TWO TO THREE TIMES DAILY AS NEEDED FOR MUSCLE SPASM 270 tablet 0  . benazepril-hydrochlorthiazide (LOTENSIN HCT) 20-25 MG tablet TAKE 1 TABLET EVERY DAY 90 tablet 1  . betaxolol (BETOPTIC-S) 0.25 % ophthalmic  suspension Place 1 drop into both eyes 2 (two) times daily.    . Blood Glucose Monitoring Suppl (ACCU-CHEK AVIVA PLUS) W/DEVICE KIT USE AS DIRECTED 1 kit 0  . Brinzolamide-Brimonidine (SIMBRINZA) 1-0.2 % SUSP Apply to eye. 2 drops each daily    . CALCIUM PO Take 500 mg by mouth daily.     . COD LIVER OIL PO Take by mouth daily.    Marland Kitchen gabapentin (NEURONTIN) 100 MG capsule TAKE 1 CAPSULE THREE TIMES DAILY 270 capsule 1  . glucose blood (ACCU-CHEK AVIVA PLUS) test strip Check blood glucose 1 time daily-DX-E11.22 100 each 12  . LORazepam (ATIVAN) 2 MG tablet TAKE 1/2 TO 1 TABLET THREE TIMES DAILY AS NEEDED FOR ANXIETY 270 tablet 1  . meloxicam (MOBIC) 15 MG tablet TAKE 1 TABLET(15 MG) BY MOUTH DAILY 90 tablet 0  . metFORMIN (GLUCOPHAGE-XR) 500 MG 24 hr tablet TAKE 1 TABLET AT BEDTIME 90 tablet 3  . Multiple Vitamin (MULTIVITAMIN WITH MINERALS) TABS tablet Take 1 tablet by mouth daily.    . ranitidine (ZANTAC) 300 MG tablet TAKE 1 TABLET AT BEDTIME 90 tablet 4  . VOLTAREN 1 % GEL APPLY  2  TO  4  GRAMS TOPICALLY FOUR TIMES DAILY 100 g 0   No current facility-administered medications on file prior to visit.     Health Maintenance:   Immunization History  Administered Date(s) Administered  . Influenza, High Dose Seasonal PF 01/04/2015  . Pneumococcal Conjugate-13 11/16/2013  . Pneumococcal Polysaccharide-23 04/12/2015  . Pneumococcal-Unspecified 03/18/2005  . Tdap 03/18/2009  . Zoster 11/13/2012    Tetanus: 2011  Pneumovax: 2017, 2015 Flu vaccine: 2016 Zostavax: 2014 LMP: Hysterectomy  MGM: 2016 DEXA: 2016 Colonoscopy: 2009 Last Eye Exam: 10/02/15  Patient Care Team: Unk Pinto, MD as PCP - General (Internal Medicine) Normajean Glasgow, MD as Attending Physician (Physical Medicine and Rehabilitation) Phylliss Bob, MD as Consulting Physician (Orthopedic Surgery) Juanita Craver, MD as Consulting Physician (Gastroenterology)  Allergies:  Allergies  Allergen Reactions  . Penicillins  Shortness Of Breath    And hives  . Aspirin Other (See Comments)    Stomach upset, but low dose is ok  . Sulfa Antibiotics Other (See Comments)    unknown  . Codeine Rash    Medical History:  Past Medical History:  Diagnosis Date  . Asthma   . Bronchitis    hx of;>81yr ago  . Chronic neck pain    arthritis  . COPD [place] obstructive [place] disease) [place]   . Degenerative disc disease    neck  . DeQuervain's disease (tenosynovitis)   . Diverticulitis   . Dizziness    related to neck issues  . Frozen shoulder September 2012  . GERD (gastroesophageal reflux disease)    takes Ranitidine nightly  . Glaucoma   . Hay fever   . Headache(784.0)    related neck issues  . High cholesterol    takes Lipitor daily  . History of shingles 2012  .  Hypertension    takes Lotensin HCT daily  . Insomnia    d/t pain and takes Ativan nightly  . Osteoporosis   . Sarcoidosis ([place]) >18yr ago   no problems since  . Type II or unspecified type diabetes mellitus without mention of complication, not stated as uncontrolled    takes Metformin nightly    Surgical History:  Past Surgical History:  Procedure Laterality Date  . ABDOMINAL HYSTERECTOMY  1975  . ANTERIOR CERVICAL DECOMP/DISCECTOMY FUSION  07/31/2011   Procedure: ANTERIOR CERVICAL DECOMPRESSION/DISCECTOMY FUSION 3 LEVELS;  Surgeon: RHosie Spangle MD;  [facility];  Service: Neurosurgery;  Laterality: N/A;  Cervical four-five,Cervical five-six,Cervical six-seven anterior cervical decompression with fusion plating and bonegraft  . BREAST SURGERY  20+yrs ago   left breast;benign  . CARPAL TUNNEL RELEASE  2012  . COLONOSCOPY    . REFRACTIVE SURGERY  2011   bilateral  . TONSILLECTOMY  30+yrs ago  . WRIST SURGERY     ORIF left wrist and then removed d/t protruding screw in 2012    Family History:  Family History  Problem Relation Age of Onset  . Cancer Mother 463   breast cancer  . Pneumonia Father   .  Leukemia Father   . Glaucoma Father   . Anesthesia problems Neg Hx   . Hypotension Neg Hx   . Malignant hyperthermia Neg Hx   . Pseudochol deficiency Neg Hx   . Mitral valve prolapse Sister   . Hypertension Sister   . HIV Son     Social History:  Social History  Substance Use Topics  . Smoking status: Never Smoker  . Smokeless tobacco: Never Used  . Alcohol use No    Review of Systems: Review of Systems  Constitutional: Negative for chills, fever and malaise/fatigue.  HENT: Negative for congestion, ear pain and sore throat.   Eyes: Negative.   Respiratory: Negative for cough, shortness of breath and wheezing.   Cardiovascular: Negative for chest pain, palpitations and leg swelling.  Gastrointestinal: Negative for abdominal pain, blood in stool, constipation, diarrhea, heartburn and melena.  Genitourinary: Negative.   Skin: Negative.   Neurological: Negative for dizziness, sensory change, loss of consciousness and headaches.  Psychiatric/Behavioral: Negative for depression. The patient is not nervous/anxious and does not have insomnia.     Physical Exam: Estimated body mass index is 29.12 kg/m as calculated from the following:   Height as of this encounter: 5' 5"  (1.651 m).   Weight as of this encounter: 175 lb (79.4 kg). BP 106/62   Pulse 74   Temp 98 F (36.7 C) (Temporal)   Resp 18   Ht 5' 5"  (1.651 m)   Wt 175 lb (79.4 kg)   BMI 29.12 kg/m   General Appearance: Well nourished well developed, in no apparent distress.  Eyes: PERRLA, EOMs, conjunctiva no swelling or erythema ENT/Mouth: Ear canals normal without obstruction, swelling, erythema, or discharge.  TMs normal bilaterally with no erythema, bulging, retraction, or loss of landmark.  Oropharynx moist and clear with no exudate, erythema, or swelling.   Neck: Supple, thyroid normal. No bruits.  No cervical adenopathy Respiratory: Respiratory effort normal, Breath sounds clear A&P without wheeze, rhonchi,  rales.   Cardio: RRR without murmurs, rubs or gallops. Brisk peripheral pulses without edema.  Chest: symmetric, with normal excursions Breasts: Symmetric, without lumps, nipple discharge, retractions.  Abdomen: Soft, nontender, no guarding, rebound, hernias, masses, or organomegaly.  Lymphatics: Non tender without lymphadenopathy.  Musculoskeletal: Full  ROM all peripheral extremities,5/5 strength, and normal gait.  Skin: Warm, dry without rashes, lesions, ecchymosis.  Normal diabetic foot exam bilaterally Neuro: Awake and oriented X 3, Cranial nerves intact, reflexes equal bilaterally. Normal muscle tone, no cerebellar symptoms. Sensation intact.  Psych:  normal affect, Insight and Judgment appropriate.   EKG: WNL no changes.  Over 40 minutes of exam, counseling, chart review and critical decision making was performed  Loma Sousa Forcucci 9:30 AM [facility]

## 2015-10-11 LAB — MICROALBUMIN / CREATININE URINE RATIO
CREATININE, URINE: 220 mg/dL (ref 20–320)
MICROALB/CREAT RATIO: 4 ug/mg{creat} (ref <30)
Microalb, Ur: 0.8 mg/dL

## 2015-10-11 LAB — URINALYSIS, ROUTINE W REFLEX MICROSCOPIC
BILIRUBIN URINE: NEGATIVE
Glucose, UA: NEGATIVE
Hgb urine dipstick: NEGATIVE
Ketones, ur: NEGATIVE
LEUKOCYTES UA: NEGATIVE
Nitrite: NEGATIVE
PROTEIN: NEGATIVE
Specific Gravity, Urine: 1.021 (ref 1.001–1.035)
pH: 5.5 (ref 5.0–8.0)

## 2015-10-11 LAB — VITAMIN D 25 HYDROXY (VIT D DEFICIENCY, FRACTURES): VIT D 25 HYDROXY: 56 ng/mL (ref 30–100)

## 2015-10-11 LAB — HEMOGLOBIN A1C
Hgb A1c MFr Bld: 6.2 % — ABNORMAL HIGH (ref <5.7)
Mean Plasma Glucose: 131 mg/dL

## 2015-10-11 LAB — HEPATITIS C ANTIBODY: HCV Ab: NEGATIVE

## 2015-10-11 LAB — INSULIN, RANDOM: INSULIN: 11.6 u[IU]/mL (ref 2.0–19.6)

## 2015-10-17 DIAGNOSIS — M5412 Radiculopathy, cervical region: Secondary | ICD-10-CM | POA: Diagnosis not present

## 2015-10-17 DIAGNOSIS — M4322 Fusion of spine, cervical region: Secondary | ICD-10-CM | POA: Diagnosis not present

## 2015-10-17 DIAGNOSIS — M7552 Bursitis of left shoulder: Secondary | ICD-10-CM | POA: Diagnosis not present

## 2015-11-13 DIAGNOSIS — M722 Plantar fascial fibromatosis: Secondary | ICD-10-CM | POA: Diagnosis not present

## 2016-01-08 LAB — HM MAMMOGRAPHY: HM Mammogram: NORMAL (ref 0–4)

## 2016-01-31 ENCOUNTER — Ambulatory Visit (INDEPENDENT_AMBULATORY_CARE_PROVIDER_SITE_OTHER): Payer: Commercial Managed Care - HMO | Admitting: Internal Medicine

## 2016-01-31 ENCOUNTER — Encounter (INDEPENDENT_AMBULATORY_CARE_PROVIDER_SITE_OTHER): Payer: Self-pay

## 2016-01-31 ENCOUNTER — Encounter: Payer: Self-pay | Admitting: Internal Medicine

## 2016-01-31 VITALS — BP 124/70 | HR 80 | Temp 98.2°F | Resp 18 | Ht 65.0 in | Wt 182.0 lb

## 2016-01-31 DIAGNOSIS — I10 Essential (primary) hypertension: Secondary | ICD-10-CM | POA: Diagnosis not present

## 2016-01-31 DIAGNOSIS — E1121 Type 2 diabetes mellitus with diabetic nephropathy: Secondary | ICD-10-CM

## 2016-01-31 DIAGNOSIS — Z79899 Other long term (current) drug therapy: Secondary | ICD-10-CM | POA: Diagnosis not present

## 2016-01-31 DIAGNOSIS — E559 Vitamin D deficiency, unspecified: Secondary | ICD-10-CM

## 2016-01-31 DIAGNOSIS — E7800 Pure hypercholesterolemia, unspecified: Secondary | ICD-10-CM | POA: Diagnosis not present

## 2016-01-31 LAB — HEPATIC FUNCTION PANEL
ALT: 14 U/L (ref 6–29)
AST: 20 U/L (ref 10–35)
Albumin: 4.2 g/dL (ref 3.6–5.1)
Alkaline Phosphatase: 44 U/L (ref 33–130)
BILIRUBIN DIRECT: 0.1 mg/dL (ref <=0.2)
BILIRUBIN INDIRECT: 0.3 mg/dL (ref 0.2–1.2)
BILIRUBIN TOTAL: 0.4 mg/dL (ref 0.2–1.2)
Total Protein: 6.6 g/dL (ref 6.1–8.1)

## 2016-01-31 LAB — BASIC METABOLIC PANEL WITH GFR
BUN: 13 mg/dL (ref 7–25)
CALCIUM: 9.5 mg/dL (ref 8.6–10.4)
CHLORIDE: 102 mmol/L (ref 98–110)
CO2: 28 mmol/L (ref 20–31)
Creat: 0.94 mg/dL (ref 0.50–0.99)
GFR, EST NON AFRICAN AMERICAN: 63 mL/min (ref >=60)
GFR, Est African American: 72 mL/min (ref >=60)
Glucose, Bld: 120 mg/dL — ABNORMAL HIGH (ref 65–99)
Potassium: 4.2 mmol/L (ref 3.5–5.3)
SODIUM: 138 mmol/L (ref 135–146)

## 2016-01-31 LAB — LIPID PANEL
CHOL/HDL RATIO: 2.7 ratio (ref <5.0)
CHOLESTEROL: 162 mg/dL (ref <200)
HDL: 59 mg/dL (ref >50)
LDL Cholesterol: 83 mg/dL (ref <100)
Triglycerides: 100 mg/dL (ref <150)
VLDL: 20 mg/dL (ref <30)

## 2016-01-31 LAB — CBC WITH DIFFERENTIAL/PLATELET
Basophils Absolute: 0 cells/uL (ref 0–200)
Basophils Relative: 0 %
EOS PCT: 3 %
Eosinophils Absolute: 117 cells/uL (ref 15–500)
HCT: 38 % (ref 35.0–45.0)
HEMOGLOBIN: 12.5 g/dL (ref 11.7–15.5)
LYMPHS ABS: 1482 {cells}/uL (ref 850–3900)
Lymphocytes Relative: 38 %
MCH: 29.6 pg (ref 27.0–33.0)
MCHC: 32.9 g/dL (ref 32.0–36.0)
MCV: 90 fL (ref 80.0–100.0)
MONOS PCT: 8 %
MPV: 9.7 fL (ref 7.5–12.5)
Monocytes Absolute: 312 cells/uL (ref 200–950)
NEUTROS ABS: 1989 {cells}/uL (ref 1500–7800)
Neutrophils Relative %: 51 %
PLATELETS: 281 10*3/uL (ref 140–400)
RBC: 4.22 MIL/uL (ref 3.80–5.10)
RDW: 14 % (ref 11.0–15.0)
WBC: 3.9 10*3/uL (ref 3.8–10.8)

## 2016-01-31 LAB — MAGNESIUM: MAGNESIUM: 2.1 mg/dL (ref 1.5–2.5)

## 2016-01-31 LAB — TSH: TSH: 1.98 mIU/L

## 2016-01-31 NOTE — Progress Notes (Signed)
[facility] Unk Pinto, M.D.        Uvaldo Bristle. Silverio Lay, P.A.-C       Starlyn Skeans, P.A.-C  [facility]                [address] [place], [place] [id number] Telephone [phone] Telefax [phone] ______________________________________________________________________     This very nice 70 y.o. SBF presents for 3 month follow up with Hypertension, Hyperlipidemia, Pre-Diabetes and Vitamin D Deficiency.      Patient is treated for HTN (2003) & BP has been controlled at home. Today's BP is at goal - 124/70. Patient has had no complaints of any cardiac type chest pain, palpitations, dyspnea/orthopnea/PND, dizziness, claudication, or dependent edema.     Hyperlipidemia is controlled with diet & meds. Patient denies myalgias or other med SE's. Last Lipids were at goal: Lab Results  Component Value Date   CHOL 177 10/10/2015   HDL 74 10/10/2015   LDLCALC 87 10/10/2015   TRIG 80 10/10/2015   CHOLHDL 2.4 10/10/2015      Also, the patient has history of T2_NIDDM (2011)  with A1c 7.4% and with Stage 2 CKD (GFR 80) and reports CBGs' range betw 80-100 mg%.  She has had no symptoms of reactive hypoglycemia, diabetic polys, paresthesias or visual blurring.  Last A1c was not at goal: Lab Results  Component Value Date   HGBA1C 6.2 (H) 10/10/2015      Further, the patient also has history of Vitamin D Deficiency and supplements vitamin D without any suspected side-effects. Last vitamin D was still low (goal is betw 70-100):  Lab Results  Component Value Date   VD25OH 56 10/10/2015   Current Outpatient Prescriptions on File Prior to Visit  Medication Sig  . acetaminophen  650 MG CR tablet Take 1,300 mg every 8  hours as needed  . albuterol  HFA inhaler  2 puffs  every 6 hours as needed for  . alendronate (FOSAMAX) 70 MG tablet TAKE 1 TABLET ONCE A WEEK.  . Ascorbic Acid (VITAMIN C PO) Take 1,000 mg by  mouth daily.   Marland Kitchen aspirin EC 81 MG tablet Take 81 mg by mouth daily.  Marland Kitchen atorvastatin (LIPITOR) 40 MG tablet TAKE 1 TABLET EVERY DAY  . baclofen (LIORESAL) 10 MG tablet TAKE 1/2-1 TAB 2-3 x/da    . benazepril-hctz 20-25 MG tablet TAKE 1 TABLET EVERY DAY  . BETOPTIC-S  ophth susp Place 1 drop into both eyes 2  times daily.  . AZOPT 1 % ophth susp Place 1 drop into both eyes 2 (two) times daily.  Marland Kitchen SIMBRINZA 1-0.2 % SUSP Apply to eye. 2 drops each daily  . CALCIUM PO Take 500 mg by mouth daily.   . COD LIVER OIL PO Take by mouth daily.  Marland Kitchen gabapentin  100 MG capsule TAKE 1 CAPSULE THREE TIMES DAILY  . LORazepam (ATIVAN) 2 MG TAKE 1/2-1 TAB THREE TIMES DAILY AS NEEDED  . metFORMIN-XR 500 MG TAKE 1 TABLET AT BEDTIME  . MULTIVIT WITH MIN Take 1 tablet by mouth daily.  . ranitidine (ZANTAC) 300 MG tablet TAKE 1 TABLET AT BEDTIME  . timolol (BETIMOL) ophth soln Place 1 drop into both eyes 2 (two) times daily.  Dorette Grate  ophth soln 1 drop at bedtime.  . VOLTAREN 1 % GEL APPLY  2  TO  4  GRAMS  TOPICALLY FOUR TIMES DAILY   Allergies  Allergen Reactions  . Penicillins Shortness Of Breath    And hives  . Aspirin Other (See Comments)    Stomach upset, but low dose is ok  . Sulfa Antibiotics Other (See Comments)    unknown  . Codeine Rash   PMHx:   Past Medical History:  Diagnosis Date  . Asthma   . Bronchitis    hx of;>56yrs ago  . Chronic neck pain    arthritis  . COPD (chronic obstructive pulmonary disease) ([place])   . Degenerative disc disease    neck  . DeQuervain's disease (tenosynovitis)   . Diverticulitis   . Dizziness    related to neck issues  . Frozen shoulder September 2012  . GERD (gastroesophageal reflux disease)    takes Ranitidine nightly  . Glaucoma   . Hay fever   . Headache(784.0)    related neck issues  . High cholesterol    takes Lipitor daily  . History of shingles 2012  . Hypertension    takes Lotensin HCT daily  . Insomnia    d/t pain and takes Ativan  nightly  . Osteoporosis   . Sarcoidosis ([place]) >13yrs ago   no problems since  . Type II or unspecified type diabetes mellitus without mention of complication, not stated as uncontrolled    takes Metformin nightly   Immunization History  Administered Date(s) Administered  . Influenza, High Dose Seasonal PF 01/04/2015  . Pneumococcal Conjugate-13 11/16/2013  . Pneumococcal Polysaccharide-23 04/12/2015  . Pneumococcal-Unspecified 03/18/2005  . Tdap 03/18/2009  . Zoster 11/13/2012   Past Surgical History:  Procedure Laterality Date  . ABDOMINAL HYSTERECTOMY  1975  . ANTERIOR CERVICAL DECOMP/DISCECTOMY FUSION  07/31/2011   Procedure: ANTERIOR CERVICAL DECOMPRESSION/DISCECTOMY FUSION 3 LEVELS;  Surgeon: Hosie Spangle, MD;  Location: [facility];  Service: Neurosurgery;  Laterality: N/A;  Cervical four-five,Cervical five-six,Cervical six-seven anterior cervical decompression with fusion plating and bonegraft  . BREAST SURGERY  20+yrs ago   left breast;benign  . CARPAL TUNNEL RELEASE  2012  . COLONOSCOPY    . REFRACTIVE SURGERY  2011   bilateral  . TONSILLECTOMY  30+yrs ago  . WRIST SURGERY     ORIF left wrist and then removed d/t protruding screw in 2012   FHx:    Reviewed / unchanged  SHx:    Reviewed / unchanged  Systems Review:  Constitutional: Denies fever, chills, wt changes, headaches, insomnia, fatigue, night sweats, change in appetite. Eyes: Denies redness, blurred vision, diplopia, discharge, itchy, watery eyes.  ENT: Denies discharge, congestion, post nasal drip, epistaxis, sore throat, earache, hearing loss, dental pain, tinnitus, vertigo, sinus pain, snoring.  CV: Denies chest pain, palpitations, irregular heartbeat, syncope, dyspnea, diaphoresis, orthopnea, PND, claudication or edema. Respiratory: denies cough, dyspnea, DOE, pleurisy, hoarseness, laryngitis, wheezing.  Gastrointestinal: Denies dysphagia, odynophagia, heartburn, reflux, water brash, abdominal pain or  cramps, nausea, vomiting, bloating, diarrhea, constipation, hematemesis, melena, hematochezia  or hemorrhoids. Genitourinary: Denies dysuria, frequency, urgency, nocturia, hesitancy, discharge, hematuria or flank pain. Musculoskeletal: Denies arthralgias, myalgias, stiffness, jt. swelling, pain, limping or strain/sprain.  Skin: Denies pruritus, rash, hives, warts, acne, eczema or change in skin lesion(s). Neuro: No weakness, tremor, incoordination, spasms, paresthesia or pain. Psychiatric: Denies confusion, memory loss or sensory loss. Endo: Denies change in weight, skin or hair change.  Heme/Lymph: No excessive bleeding, bruising or enlarged lymph nodes.  Physical Exam BP 124/70   Pulse 80   Temp 98.2 F (36.8 C) (Temporal)  Resp 18   Ht 5\' 5"  (1.651 m)   Wt 182 lb (82.6 kg)   BMI 30.29 kg/m   Appears over nourished and in no distress.  Eyes: PERRLA, EOMs, conjunctiva no swelling or erythema. Sinuses: No frontal/maxillary tenderness ENT/Mouth: EAC's clear, TM's nl w/o erythema, bulging. Nares clear w/o erythema, swelling, exudates. Oropharynx clear without erythema or exudates. Oral hygiene is good. Tongue normal, non obstructing. Hearing intact.  Neck: Supple. Thyroid nl. Car 2+/2+ without bruits, nodes or JVD. Chest: Respirations nl with BS clear & equal w/o rales, rhonchi, wheezing or stridor.  Cor: Heart sounds normal w/ regular rate and rhythm without sig. murmurs, gallops, clicks, or rubs. Peripheral pulses normal and equal  without edema.  Abdomen: Soft & bowel sounds normal. Non-tender w/o guarding, rebound, hernias, masses, or organomegaly.  Lymphatics: Unremarkable.  Musculoskeletal: Full ROM all peripheral extremities, joint stability, 5/5 strength, and normal gait.  Skin: Warm, dry without exposed rashes, lesions or ecchymosis apparent.  Neuro: Cranial nerves intact, reflexes equal bilaterally. Sensory-motor testing grossly intact. Tendon reflexes grossly intact.   Pysch: Alert & oriented x 3.  Insight and judgement nl & appropriate. No ideations.  Assessment and Plan:   1. Essential hypertension  - Continue medication, monitor blood pressure at home.  -Continue DASH diet. Reminder to go to the ER if any CP,  SOB, nausea, dizziness, severe HA, changes vision/speech,  left arm numbness and tingling and jaw pain. - TSH  2. Hyperlipidemia  - Continue diet/meds, exercise,& lifestyle modifications.  - Continue monitor periodic cholesterol/liver & renal functions - Lipid panel - TSH  3. Type 2 diabetes mellitus with diabetic nephropathy (HCC)  - Continue diet, exercise, lifestyle modifications.  - Monitor appropriate labs. - Hemoglobin A1c - Insulin, random  4. Vitamin D deficiency  - Continue supplementation. - VITAMIN D 25 Hydroxy  5. Medication management  - CBC with Differential/Platelet - BASIC METABOLIC PANEL WITH GFR - Hepatic function panel - Magnesium       Recommended regular exercise, BP monitoring, weight control, and discussed med and SE's. Recommended labs to assess and monitor clinical status. Further disposition pending results of labs. Over 30 minutes of exam, counseling, chart review was performed

## 2016-01-31 NOTE — Patient Instructions (Signed)

## 2016-02-01 LAB — HEMOGLOBIN A1C
Hgb A1c MFr Bld: 6.4 % — ABNORMAL HIGH (ref <5.7)
Mean Plasma Glucose: 137 mg/dL

## 2016-02-01 LAB — INSULIN, RANDOM: INSULIN: 8.7 u[IU]/mL (ref 2.0–19.6)

## 2016-02-01 LAB — VITAMIN D 25 HYDROXY (VIT D DEFICIENCY, FRACTURES): VIT D 25 HYDROXY: 51 ng/mL (ref 30–100)

## 2016-02-06 ENCOUNTER — Encounter: Payer: Self-pay | Admitting: Internal Medicine

## 2016-02-17 ENCOUNTER — Encounter: Payer: Self-pay | Admitting: *Deleted

## 2016-03-04 DIAGNOSIS — H2513 Age-related nuclear cataract, bilateral: Secondary | ICD-10-CM | POA: Diagnosis not present

## 2016-03-04 DIAGNOSIS — H401133 Primary open-angle glaucoma, bilateral, severe stage: Secondary | ICD-10-CM | POA: Diagnosis not present

## 2016-04-01 ENCOUNTER — Other Ambulatory Visit: Payer: Self-pay | Admitting: Internal Medicine

## 2016-04-02 DIAGNOSIS — H401133 Primary open-angle glaucoma, bilateral, severe stage: Secondary | ICD-10-CM | POA: Diagnosis not present

## 2016-04-19 ENCOUNTER — Other Ambulatory Visit: Payer: Self-pay | Admitting: Internal Medicine

## 2016-04-19 ENCOUNTER — Encounter: Payer: Self-pay | Admitting: Internal Medicine

## 2016-04-19 MED ORDER — PREDNISONE 20 MG PO TABS
ORAL_TABLET | ORAL | 0 refills | Status: DC
Start: 2016-04-19 — End: 2016-05-21

## 2016-04-19 MED ORDER — PROMETHAZINE-DM 6.25-15 MG/5ML PO SYRP: ORAL_SOLUTION | ORAL | 1 refills | Status: DC

## 2016-04-19 MED ORDER — FLUTICASONE PROPIONATE 50 MCG/ACT NA SUSP: 2.0000 | Freq: Every day | NASAL | 0 refills | Status: DC

## 2016-05-16 ENCOUNTER — Other Ambulatory Visit: Payer: Self-pay | Admitting: Internal Medicine

## 2016-05-21 ENCOUNTER — Ambulatory Visit (INDEPENDENT_AMBULATORY_CARE_PROVIDER_SITE_OTHER): Payer: Medicare HMO | Admitting: Internal Medicine

## 2016-05-21 ENCOUNTER — Encounter: Payer: Self-pay | Admitting: Internal Medicine

## 2016-05-21 VITALS — BP 110/64 | HR 92 | Temp 98.2°F | Resp 16 | Ht 65.0 in | Wt 178.0 lb

## 2016-05-21 DIAGNOSIS — E559 Vitamin D deficiency, unspecified: Secondary | ICD-10-CM

## 2016-05-21 DIAGNOSIS — Z79899 Other long term (current) drug therapy: Secondary | ICD-10-CM

## 2016-05-21 DIAGNOSIS — E782 Mixed hyperlipidemia: Secondary | ICD-10-CM

## 2016-05-21 DIAGNOSIS — I10 Essential (primary) hypertension: Secondary | ICD-10-CM

## 2016-05-21 DIAGNOSIS — R7303 Prediabetes: Secondary | ICD-10-CM | POA: Diagnosis not present

## 2016-05-21 DIAGNOSIS — M545 Low back pain: Secondary | ICD-10-CM | POA: Diagnosis not present

## 2016-05-21 DIAGNOSIS — M8580 Other specified disorders of bone density and structure, unspecified site: Secondary | ICD-10-CM

## 2016-05-21 DIAGNOSIS — G8929 Other chronic pain: Secondary | ICD-10-CM | POA: Diagnosis not present

## 2016-05-21 LAB — BASIC METABOLIC PANEL WITH GFR
BUN: 17 mg/dL (ref 7–25)
CALCIUM: 10.3 mg/dL (ref 8.6–10.4)
CO2: 26 mmol/L (ref 20–31)
Chloride: 102 mmol/L (ref 98–110)
Creat: 1.01 mg/dL — ABNORMAL HIGH (ref 0.50–0.99)
GFR, EST AFRICAN AMERICAN: 66 mL/min (ref >=60)
GFR, EST NON AFRICAN AMERICAN: 57 mL/min — AB (ref >=60)
Glucose, Bld: 117 mg/dL — ABNORMAL HIGH (ref 65–99)
POTASSIUM: 4.5 mmol/L (ref 3.5–5.3)
Sodium: 139 mmol/L (ref 135–146)

## 2016-05-21 LAB — TSH: TSH: 1.47 mIU/L

## 2016-05-21 LAB — CBC WITH DIFFERENTIAL/PLATELET
BASOS PCT: 0 %
Basophils Absolute: 0 cells/uL (ref 0–200)
EOS PCT: 3 %
Eosinophils Absolute: 114 cells/uL (ref 15–500)
HCT: 39.5 % (ref 35.0–45.0)
Hemoglobin: 12.8 g/dL (ref 11.7–15.5)
LYMPHS PCT: 33 %
Lymphs Abs: 1254 cells/uL (ref 850–3900)
MCH: 29.6 pg (ref 27.0–33.0)
MCHC: 32.4 g/dL (ref 32.0–36.0)
MCV: 91.2 fL (ref 80.0–100.0)
MONOS PCT: 11 %
MPV: 9.4 fL (ref 7.5–12.5)
Monocytes Absolute: 418 cells/uL (ref 200–950)
Neutro Abs: 2014 cells/uL (ref 1500–7800)
Neutrophils Relative %: 53 %
PLATELETS: 291 10*3/uL (ref 140–400)
RBC: 4.33 MIL/uL (ref 3.80–5.10)
RDW: 14.8 % (ref 11.0–15.0)
WBC: 3.8 10*3/uL (ref 3.8–10.8)

## 2016-05-21 LAB — HEPATIC FUNCTION PANEL
ALBUMIN: 4.5 g/dL (ref 3.6–5.1)
ALK PHOS: 50 U/L (ref 33–130)
ALT: 26 U/L (ref 6–29)
AST: 21 U/L (ref 10–35)
BILIRUBIN INDIRECT: 0.5 mg/dL (ref 0.2–1.2)
BILIRUBIN TOTAL: 0.6 mg/dL (ref 0.2–1.2)
Bilirubin, Direct: 0.1 mg/dL (ref <=0.2)
Total Protein: 7 g/dL (ref 6.1–8.1)

## 2016-05-21 LAB — LIPID PANEL
CHOLESTEROL: 219 mg/dL — AB (ref <200)
HDL: 66 mg/dL (ref >50)
LDL Cholesterol: 133 mg/dL — ABNORMAL HIGH (ref <100)
TRIGLYCERIDES: 98 mg/dL (ref <150)
Total CHOL/HDL Ratio: 3.3 Ratio (ref <5.0)
VLDL: 20 mg/dL (ref <30)

## 2016-05-21 MED ORDER — TRAMADOL HCL 50 MG PO TABS: 50.0000 mg | ORAL_TABLET | Freq: Four times a day (QID) | ORAL | 0 refills | Status: DC | PRN

## 2016-05-21 NOTE — Progress Notes (Signed)
Assessment and Plan:  Hypertension:  -well controlled -Continue medication,  -monitor blood pressure at home.  -Continue DASH diet.   -Reminder to go to the ER if any CP, SOB, nausea, dizziness, severe HA, changes vision/speech, left arm numbness and tingling, and jaw pain.  Cholesterol: -cont lipitor -Continue diet and exercise.  -Check cholesterol.   Pre-diabetes: -has been close to borderline of diabetes recently -is on low dose metformin as a preventative.   -Continue diet and exercise.  -Check A1C  Vitamin D Def: -continue medications.   Osteoporosis -tolerateing fosamax well -no GERD  Chronic back pain -cont voltaren gel -cont baclofen -tramadol prn -use heating pad -recommended daily stretching -restart PT exercises as home -cont gabapentin  Continue diet and meds as discussed. Further disposition pending results of labs.  HPI 71 y.o. female  presents for 3 month follow up with hypertension, hyperlipidemia, prediabetes and vitamin D.   Her blood pressure has been controlled at home, today their BP is BP: 110/64.   She does not workout. She denies chest pain, shortness of breath, dizziness.   She is on cholesterol medication and denies myalgias. Her cholesterol is at goal. The cholesterol last visit was:   Lab Results  Component Value Date   CHOL 162 01/31/2016   HDL 59 01/31/2016   LDLCALC 83 01/31/2016   TRIG 100 01/31/2016   CHOLHDL 2.7 01/31/2016     She has been working on diet and exercise for prediabetes, and denies foot ulcerations, hyperglycemia, hypoglycemia , increased appetite, nausea, paresthesia of the feet, polydipsia, polyuria, visual disturbances, vomiting and weight loss. Last A1C in the office was:  Lab Results  Component Value Date   HGBA1C 6.4 (H) 01/31/2016    Patient is on Vitamin D supplement.  Lab Results  Component Value Date   VD25OH 51 01/31/2016     She reports that she has been having some issues with back pain.  She  reports that she tripped over a parking stump in 2011 and has had some chronic back pain since then.  She reports that she has had worsening back pain since she had a bad cold.  She reports that the more activity and the more she is on her feet the worse that her back is.    She is tolerating her fosamax well.  No issues with GERD or chest pain.  She stays upright after taking it.    She reports that her vision is doing okay lately.  She reports that her eye medications are expensive though.    Current Medications:  Current Outpatient Prescriptions on File Prior to Visit  Medication Sig Dispense Refill  . ACCU-CHEK SOFTCLIX LANCETS lancets CHECK BLOOD GLUCOSE EVERY DAY 100 each 0  . acetaminophen (TYLENOL ARTHRITIS PAIN) 650 MG CR tablet Take 1,300 mg by mouth every 8 (eight) hours as needed. For pain    . albuterol (PROVENTIL HFA;VENTOLIN HFA) 108 (90 Base) MCG/ACT inhaler Inhale 2 puffs into the lungs every 6 (six) hours as needed for wheezing or shortness of breath (cough). 1 Inhaler 0  . alendronate (FOSAMAX) 70 MG tablet TAKE 1 TABLET ONCE A WEEK. TAKE WITH A FULL GLASS OF WATER ON AN EMPTY STOMACH. 12 tablet 3  . Ascorbic Acid (VITAMIN C PO) Take 1,000 mg by mouth daily.     Marland Kitchen aspirin EC 81 MG tablet Take 81 mg by mouth daily.    Marland Kitchen atorvastatin (LIPITOR) 40 MG tablet TAKE 1 TABLET EVERY DAY 90 tablet 1  .  baclofen (LIORESAL) 10 MG tablet TAKE 1/2 TO 1 TABLET TWO TO THREE TIMES DAILY AS NEEDED FOR MUSCLE SPASM 270 tablet 0  . benazepril-hydrochlorthiazide (LOTENSIN HCT) 20-25 MG tablet TAKE 1 TABLET EVERY DAY 90 tablet 1  . betaxolol (BETOPTIC-S) 0.25 % ophthalmic suspension Place 1 drop into both eyes 2 (two) times daily.    . Blood Glucose Monitoring Suppl (ACCU-CHEK AVIVA PLUS) W/DEVICE KIT USE AS DIRECTED 1 kit 0  . brinzolamide (AZOPT) 1 % ophthalmic suspension Place 1 drop into both eyes 2 (two) times daily.    . Brinzolamide-Brimonidine (SIMBRINZA) 1-0.2 % SUSP Apply to eye. 2 drops  each daily    . CALCIUM PO Take 500 mg by mouth daily.     . COD LIVER OIL PO Take by mouth daily.    . fluticasone (FLONASE) 50 MCG/ACT nasal spray SHAKE LIQUID AND USE 2 SPRAYS IN EACH NOSTRIL DAILY 16 g 1  . gabapentin (NEURONTIN) 100 MG capsule TAKE 1 CAPSULE THREE TIMES DAILY 270 capsule 1  . glucose blood (ACCU-CHEK AVIVA PLUS) test strip Check blood glucose 1 time daily-DX-E11.22 100 each 12  . LORazepam (ATIVAN) 2 MG tablet TAKE 1/2 TO 1 TABLET THREE TIMES DAILY AS NEEDED FOR ANXIETY 270 tablet 1  . metFORMIN (GLUCOPHAGE-XR) 500 MG 24 hr tablet TAKE 1 TABLET AT BEDTIME 90 tablet 3  . Multiple Vitamin (MULTIVITAMIN WITH MINERALS) TABS tablet Take 1 tablet by mouth daily.    . ranitidine (ZANTAC) 300 MG tablet TAKE 1 TABLET AT BEDTIME 90 tablet 4  . timolol (BETIMOL) 0.5 % ophthalmic solution Place 1 drop into both eyes 2 (two) times daily.    . travoprost, benzalkonium, (TRAVATAN) 0.004 % ophthalmic solution 1 drop at bedtime.    . VOLTAREN 1 % GEL APPLY  2  TO  4  GRAMS TOPICALLY FOUR TIMES DAILY 100 g 0   No current facility-administered medications on file prior to visit.     Medical History:  Past Medical History:  Diagnosis Date  . Asthma   . Bronchitis    hx of;>6yrs ago  . Chronic neck pain    arthritis  . COPD (chronic obstructive pulmonary disease) (HCC)   . Degenerative disc disease    neck  . DeQuervain's disease (tenosynovitis)   . Diverticulitis   . Dizziness    related to neck issues  . Frozen shoulder September 2012  . GERD (gastroesophageal reflux disease)    takes Ranitidine nightly  . Glaucoma   . Hay fever   . Headache(784.0)    related neck issues  . High cholesterol    takes Lipitor daily  . History of shingles 2012  . Hypertension    takes Lotensin HCT daily  . Insomnia    d/t pain and takes Ativan nightly  . Osteoporosis   . Sarcoidosis (HCC) >30yrs ago   no problems since  . Type II or unspecified type diabetes mellitus without mention  of complication, not stated as uncontrolled    takes Metformin nightly    Allergies:  Allergies  Allergen Reactions  . Penicillins Shortness Of Breath    And hives  . Aspirin Other (See Comments)    Stomach upset, but low dose is ok  . Hydromorphone Nausea Only    Other reaction(s): Sweating (intolerance)  . Sulfa Antibiotics Other (See Comments)    unknown  . Codeine Rash     Review of Systems:  Review of Systems  Constitutional: Negative for chills, fever and malaise/fatigue.    HENT: Negative for congestion, ear pain and sore throat.   Eyes: Negative.   Respiratory: Negative for cough, shortness of breath and wheezing.   Cardiovascular: Negative for chest pain, palpitations and leg swelling.  Gastrointestinal: Negative for abdominal pain, blood in stool, constipation, diarrhea, heartburn and melena.  Genitourinary: Negative.   Musculoskeletal: Positive for back pain.  Skin: Negative.   Neurological: Negative for dizziness, sensory change, loss of consciousness and headaches.  Psychiatric/Behavioral: Negative for depression. The patient is not nervous/anxious and does not have insomnia.     Family history- Review and unchanged  Social history- Review and unchanged  Physical Exam: BP 110/64   Pulse 92   Temp 98.2 F (36.8 C) (Temporal)   Resp 16   Ht 5' 5" (1.651 m)   Wt 178 lb (80.7 kg)   BMI 29.62 kg/m  Wt Readings from Last 3 Encounters:  05/21/16 178 lb (80.7 kg)  01/31/16 182 lb (82.6 kg)  10/10/15 175 lb (79.4 kg)    General Appearance: Well nourished well developed, in no apparent distress. Eyes: PERRLA, EOMs, conjunctiva no swelling or erythema ENT/Mouth: Ear canals normal without obstruction, swelling, erythma, discharge.  TMs normal bilaterally.  Oropharynx moist, clear, without exudate, or postoropharyngeal swelling. Neck: Supple, thyroid normal,no cervical adenopathy  Respiratory: Respiratory effort normal, Breath sounds clear A&P without rhonchi,  wheeze, or rale.  No retractions, no accessory usage. Cardio: RRR with no MRGs. Brisk peripheral pulses without edema.  Abdomen: Soft, + BS,  Non tender, no guarding, rebound, hernias, masses. Musculoskeletal: Full ROM, 5/5 strength, Normal gait Skin: Warm, dry without rashes, lesions, ecchymosis.  Neuro: Awake and oriented X 3, Cranial nerves intact. Normal muscle tone, no cerebellar symptoms. Psych: Normal affect, Insight and Judgment appropriate.    Courtney Forcucci, PA-C 9:23 AM [facility]  

## 2016-05-22 LAB — HEMOGLOBIN A1C
Hgb A1c MFr Bld: 6.6 % — ABNORMAL HIGH (ref <5.7)
MEAN PLASMA GLUCOSE: 143 mg/dL

## 2016-05-27 ENCOUNTER — Other Ambulatory Visit: Payer: Self-pay | Admitting: Internal Medicine

## 2016-05-27 NOTE — Telephone Encounter (Signed)
Please call Lorazepam 

## 2016-05-28 ENCOUNTER — Other Ambulatory Visit: Payer: Self-pay | Admitting: *Deleted

## 2016-05-28 MED ORDER — ACCU-CHEK SOFTCLIX LANCETS MISC: 1 refills | Status: DC

## 2016-05-28 MED ORDER — GLUCOSE BLOOD VI STRP: ORAL_STRIP | 1 refills | Status: DC

## 2016-05-28 MED ORDER — ACCU-CHEK AVIVA PLUS W/DEVICE KIT: PACK | 0 refills | Status: DC

## 2016-05-29 DIAGNOSIS — H401133 Primary open-angle glaucoma, bilateral, severe stage: Secondary | ICD-10-CM | POA: Diagnosis not present

## 2016-05-29 DIAGNOSIS — H2513 Age-related nuclear cataract, bilateral: Secondary | ICD-10-CM | POA: Diagnosis not present

## 2016-06-11 ENCOUNTER — Other Ambulatory Visit: Payer: Self-pay | Admitting: Internal Medicine

## 2016-06-18 DIAGNOSIS — M722 Plantar fascial fibromatosis: Secondary | ICD-10-CM | POA: Diagnosis not present

## 2016-07-01 DIAGNOSIS — H524 Presbyopia: Secondary | ICD-10-CM | POA: Diagnosis not present

## 2016-08-09 ENCOUNTER — Other Ambulatory Visit: Payer: Self-pay | Admitting: Internal Medicine

## 2016-08-09 DIAGNOSIS — H401133 Primary open-angle glaucoma, bilateral, severe stage: Secondary | ICD-10-CM | POA: Diagnosis not present

## 2016-08-09 LAB — HM DIABETES EYE EXAM

## 2016-08-13 ENCOUNTER — Other Ambulatory Visit: Payer: Self-pay | Admitting: *Deleted

## 2016-08-13 MED ORDER — ACCU-CHEK AVIVA PLUS W/DEVICE KIT: PACK | 0 refills | Status: DC

## 2016-09-25 ENCOUNTER — Other Ambulatory Visit: Payer: Self-pay | Admitting: Internal Medicine

## 2016-10-09 ENCOUNTER — Encounter: Payer: Self-pay | Admitting: Internal Medicine

## 2016-10-16 ENCOUNTER — Other Ambulatory Visit: Payer: Self-pay | Admitting: Internal Medicine

## 2016-10-22 ENCOUNTER — Other Ambulatory Visit: Payer: Self-pay | Admitting: *Deleted

## 2016-10-22 MED ORDER — FLUTICASONE PROPIONATE 50 MCG/ACT NA SUSP: NASAL | 1 refills | Status: DC

## 2016-10-22 MED ORDER — GABAPENTIN 100 MG PO CAPS: ORAL_CAPSULE | ORAL | 1 refills | Status: DC

## 2016-11-03 ENCOUNTER — Encounter: Payer: Self-pay | Admitting: Internal Medicine

## 2016-12-12 ENCOUNTER — Other Ambulatory Visit: Payer: Self-pay | Admitting: Internal Medicine

## 2016-12-31 ENCOUNTER — Ambulatory Visit (INDEPENDENT_AMBULATORY_CARE_PROVIDER_SITE_OTHER): Payer: Medicare HMO | Admitting: Physician Assistant

## 2016-12-31 ENCOUNTER — Encounter: Payer: Self-pay | Admitting: Physician Assistant

## 2016-12-31 VITALS — BP 116/80 | HR 84 | Temp 97.3°F | Resp 16 | Ht 66.0 in | Wt 179.6 lb

## 2016-12-31 DIAGNOSIS — I10 Essential (primary) hypertension: Secondary | ICD-10-CM

## 2016-12-31 DIAGNOSIS — Z23 Encounter for immunization: Secondary | ICD-10-CM | POA: Diagnosis not present

## 2016-12-31 DIAGNOSIS — J449 Chronic obstructive pulmonary disease, unspecified: Secondary | ICD-10-CM

## 2016-12-31 DIAGNOSIS — Z0001 Encounter for general adult medical examination with abnormal findings: Secondary | ICD-10-CM | POA: Diagnosis not present

## 2016-12-31 DIAGNOSIS — E1121 Type 2 diabetes mellitus with diabetic nephropathy: Secondary | ICD-10-CM | POA: Diagnosis not present

## 2016-12-31 DIAGNOSIS — E559 Vitamin D deficiency, unspecified: Secondary | ICD-10-CM

## 2016-12-31 DIAGNOSIS — Z0000 Encounter for general adult medical examination without abnormal findings: Secondary | ICD-10-CM

## 2016-12-31 DIAGNOSIS — E7800 Pure hypercholesterolemia, unspecified: Secondary | ICD-10-CM | POA: Diagnosis not present

## 2016-12-31 DIAGNOSIS — R6889 Other general symptoms and signs: Secondary | ICD-10-CM

## 2016-12-31 DIAGNOSIS — M810 Age-related osteoporosis without current pathological fracture: Secondary | ICD-10-CM

## 2016-12-31 DIAGNOSIS — Z79899 Other long term (current) drug therapy: Secondary | ICD-10-CM

## 2016-12-31 DIAGNOSIS — Z6828 Body mass index (BMI) 28.0-28.9, adult: Secondary | ICD-10-CM

## 2016-12-31 DIAGNOSIS — H409 Unspecified glaucoma: Secondary | ICD-10-CM

## 2016-12-31 DIAGNOSIS — M47812 Spondylosis without myelopathy or radiculopathy, cervical region: Secondary | ICD-10-CM

## 2016-12-31 DIAGNOSIS — N182 Chronic kidney disease, stage 2 (mild): Secondary | ICD-10-CM | POA: Diagnosis not present

## 2016-12-31 DIAGNOSIS — K219 Gastro-esophageal reflux disease without esophagitis: Secondary | ICD-10-CM

## 2016-12-31 DIAGNOSIS — J45909 Unspecified asthma, uncomplicated: Secondary | ICD-10-CM

## 2016-12-31 DIAGNOSIS — M47816 Spondylosis without myelopathy or radiculopathy, lumbar region: Secondary | ICD-10-CM

## 2016-12-31 DIAGNOSIS — M533 Sacrococcygeal disorders, not elsewhere classified: Secondary | ICD-10-CM

## 2016-12-31 DIAGNOSIS — Z136 Encounter for screening for cardiovascular disorders: Secondary | ICD-10-CM | POA: Diagnosis not present

## 2016-12-31 MED ORDER — BACLOFEN 10 MG PO TABS: ORAL_TABLET | ORAL | 2 refills | Status: DC

## 2016-12-31 NOTE — Progress Notes (Signed)
Patient ID: Sara [name], female   DOB: 10/28/1945, 71 y.o.   MRN: [id number] CPE AND FOLLOW UP Assessment:   Essential hypertension - continue medications, DASH diet, exercise and monitor at home. Call if greater than 130/80.  -     CBC with Differential/Platelet -     BASIC METABOLIC PANEL WITH GFR -     Hepatic function panel -     TSH -     Urinalysis, Routine w reflex microscopic -     Microalbumin / creatinine urine ratio -     EKG 12-Lead  Chronic obstructive pulmonary disease, unspecified COPD type ([place])  will get CXR, continue meds.   Needs flu shot -     Flu vaccine HIGH DOSE PF  Cervical arthritis Monitor symptoms, continue meds -     Iron,Total/Total Iron Binding Cap -     Vitamin B12  CKD (chronic kidney disease) stage 2, GFR 60-89 ml/min -     BASIC METABOLIC PANEL WITH GFR - Increase fluids, avoid NSAIDS, monitor sugars, will monitor  Medication management -     Magnesium  Hyperlipidemia -     Lipid panel -continue medications, check lipids, decrease fatty foods, increase activity.   BMI 28.0-28.9,adult  Overweight  - long discussion about weight loss, diet, and exercise -recommended diet heavy in fruits and veggies and low in animal meats, cheeses, and dairy products  Glaucoma, unspecified glaucoma type, unspecified laterality Continue eye doctor follow up  Vitamin D deficiency -     VITAMIN D 25 Hydroxy (Vit-D Deficiency, Fractures)  Uncomplicated asthma, unspecified asthma severity, unspecified whether persistent Monitor symptoms, avoid triggers.  Gastroesophageal reflux disease, esophagitis presence not specified Continue PPI/H2 blocker, diet discussed  Lumbar spondylosis Continue tylenol, if not better can refer to ortho or PT -     baclofen (LIORESAL) 10 MG tablet; TAKE 1/2 TO 1 TABLET TWO TO THREE TIMES DAILY AS NEEDED FOR MUSCLE SPASM  Disorder of sacroiliac joint Continue tylenol, if not better can refer to ortho or PT -      baclofen (LIORESAL) 10 MG tablet; TAKE 1/2 TO 1 TABLET TWO TO THREE TIMES DAILY AS NEEDED FOR MUSCLE SPASM  Type 2 diabetes mellitus with diabetic nephropathy, without long-term current use of insulin (HCC) -     Hemoglobin A1c Discussed general issues about diabetes pathophysiology and management., Educational material distributed., Suggested low cholesterol diet., Encouraged aerobic exercise., Discussed foot care., Reminded to get yearly retinal exam.  Routine general medical examination at a health care facility  Osteoporosis, unspecified osteoporosis type, unspecified pathological fracture presence -     DG Bone Density; Future - has been on fosamax x 4 years will take a break, patient will stop - repeat DEXa, add weight bearing exercises and continue vit D   Over 40 minutes of exam, counseling, chart review, and critical decision making was performed   Future Appointments Date Time Provider [place]  01/12/2018 10:00 AM Vicie Mutters, PA-C [id number] None    Subjective:  Sara [name] is a 71 y.o. AA female who presents for CPE and 3 month follow up for HTN, hyperlipidemia, prediabetes, and vitamin D Def.   Her blood pressure has been controlled at home, today their BP is BP: 116/80 She does workout, pool exercises and walking. She denies chest pain, shortness of breath, dizziness.   She is on cholesterol medication and denies myalgias. Her cholesterol is at goal. The cholesterol last visit was:   Lab Results  Component Value Date   CHOL 219 (H) 05/21/2016   HDL 66 05/21/2016   LDLCALC 133 (H) 05/21/2016   TRIG 98 05/21/2016   CHOLHDL 3.3 05/21/2016   She has been working on diet and exercise for prediabetes but last visit was in the DM range, and denies foot ulcerations, hyperglycemia, hypoglycemia , increased appetite, nausea, paresthesia of the feet, polydipsia, polyuria, visual disturbances, vomiting and weight loss. Last A1C in the office was:  Lab Results   Component Value Date   HGBA1C 6.6 (H) 05/21/2016   Lab Results  Component Value Date   GFRAA 66 05/21/2016   Patient is on Vitamin D supplement.   Lab Results  Component Value Date   VD25OH 51 01/31/2016     She follows with Dr. Patrice Paradise for neck pain and back pain. She was eating a lot of almonds and got constipation with this, started to use miralax and has been doing okay however she has been having lower back pain since that time.   BMI is Body mass index is 28.99 kg/m., she is working on diet and exercise. Wt Readings from Last 3 Encounters:  12/31/16 179 lb 9.6 oz (81.5 kg)  05/21/16 178 lb (80.7 kg)  01/31/16 182 lb (82.6 kg)    Medication Review: Current Outpatient Prescriptions on File Prior to Visit  Medication Sig Dispense Refill  . ACCU-CHEK SOFTCLIX LANCETS lancets CHECK BLOOD GLUCOSE 1 TIME EVERY DAY. Dx-R73.03 100 each 1  . acetaminophen (TYLENOL ARTHRITIS PAIN) 650 MG CR tablet Take 1,300 mg by mouth every 8 (eight) hours as needed. For pain    . albuterol (PROVENTIL HFA;VENTOLIN HFA) 108 (90 Base) MCG/ACT inhaler Inhale 2 puffs into the lungs every 6 (six) hours as needed for wheezing or shortness of breath (cough). 1 Inhaler 0  . alendronate (FOSAMAX) 70 MG tablet TAKE 1 TABLET ONCE A WEEK. TAKE WITH A FULL GLASS OF WATER ON AN EMPTY STOMACH. 12 tablet 3  . Ascorbic Acid (VITAMIN C PO) Take 1,000 mg by mouth daily.     Marland Kitchen aspirin EC 81 MG tablet Take 81 mg by mouth daily.    Marland Kitchen atorvastatin (LIPITOR) 40 MG tablet TAKE 1 TABLET EVERY DAY 90 tablet 1  . baclofen (LIORESAL) 10 MG tablet TAKE 1/2 TO 1 TABLET TWO TO THREE TIMES DAILY AS NEEDED FOR MUSCLE SPASM 270 tablet 0  . benazepril-hydrochlorthiazide (LOTENSIN HCT) 20-25 MG tablet TAKE 1 TABLET EVERY DAY 90 tablet 1  . betaxolol (BETOPTIC-S) 0.25 % ophthalmic suspension Place 1 drop into both eyes 2 (two) times daily.    . Blood Glucose Monitoring Suppl (ACCU-CHEK AVIVA PLUS) w/Device KIT USE AS DIRECTED  TO CHECK  BLOOD SUGAR ONE TIME DAILY 1 kit 0  . brinzolamide (AZOPT) 1 % ophthalmic suspension Place 1 drop into both eyes 2 (two) times daily.    . Brinzolamide-Brimonidine (SIMBRINZA) 1-0.2 % SUSP Apply to eye. 2 drops each daily    . CALCIUM PO Take 500 mg by mouth daily.     . COD LIVER OIL PO Take by mouth daily.    . fluticasone (FLONASE) 50 MCG/ACT nasal spray SHAKE LIQUID AND USE 2 SPRAYS IN EACH NOSTRIL DAILY 48 g 1  . gabapentin (NEURONTIN) 100 MG capsule TAKE 1 CAPSULE THREE TIMES DAILY 270 capsule 1  . glucose blood (ACCU-CHEK AVIVA PLUS) test strip Check blood glucose 1 time daily-DX-R73.03 100 each 1  . LORazepam (ATIVAN) 1 MG tablet TAKE 1/2 TO 1 TABLET SPARINGLY TWO TO  THREE TIMES DAILY  ONLY  IF  NEEDED FOR ANXIETY 90 tablet 0  . metFORMIN (GLUCOPHAGE-XR) 500 MG 24 hr tablet Take 1 to 2 tablets 2 x / day with a meal for Diabetes 360 tablet 1  . Multiple Vitamin (MULTIVITAMIN WITH MINERALS) TABS tablet Take 1 tablet by mouth daily.    . ranitidine (ZANTAC) 300 MG tablet TAKE 1 TABLET AT BEDTIME 90 tablet 4  . timolol (BETIMOL) 0.5 % ophthalmic solution Place 1 drop into both eyes 2 (two) times daily.    . traMADol (ULTRAM) 50 MG tablet Take 1 tablet (50 mg total) by mouth every 6 (six) hours as needed. 120 tablet 0  . travoprost, benzalkonium, (TRAVATAN) 0.004 % ophthalmic solution 1 drop at bedtime.    . VOLTAREN 1 % GEL APPLY  2  TO  4  GRAMS TOPICALLY FOUR TIMES DAILY 100 g 0   No current facility-administered medications on file prior to visit.     Current Problems (verified) Patient Active Problem List   Diagnosis Date Noted  . BMI 26.43,  adult 01/04/2015  . CKD (chronic kidney disease) stage 2, GFR 60-89 ml/min 11/16/2013  . Vitamin D deficiency 06/04/2013  . Medication management 06/04/2013  . Type II diabetes mellitus with renal manifestations ([place])   . GERD (gastroesophageal reflux disease)   . Glaucoma   . Hyperlipidemia   . Hypertension   . COPD (chronic obstructive  pulmonary disease) ([place])   . Asthma   . Disc disease, degenerative, cervical   . Hay fever   . Lumbar spondylosis 03/27/2012  . Disorder of sacroiliac joint 05/31/2011  . Myofascial pain syndrome 05/31/2011    Screening Tests Immunization History  Administered Date(s) Administered  . Influenza, High Dose Seasonal PF 01/04/2015, 12/31/2016  . Influenza-Unspecified 03/01/2016  . Pneumococcal Conjugate-13 11/16/2013  . Pneumococcal Polysaccharide-23 04/12/2015  . Pneumococcal-Unspecified 03/18/2005  . Tdap 03/18/2009  . Zoster 11/13/2012   Preventative care: Last colonoscopy: 2009 [facility] 01/08/2016 [facility] Dexa 11/2014 DUE this year on fosamax x 2014 will stop MRI neck 2017 CXR 2015 CT AB 2011 diverticulitis  Prior vaccinations: TD or Tdap: 2011  Influenza: 2018 Pneumococcal: 2017 Prevnar13: 2015 Shingles/Zostavax: 2014  Names of Other Physician/Practitioners you currently use: 1. [facility] here for primary care 2. [facility] Dr. Lenna Gilford,  last visit April 2018 3. Dr. Linwood Dibbles for glaucoma.  4. Dr. Georgie Chard, dentist, last visit 2017 Patient Care Team: Unk Pinto, MD as PCP - General (Internal Medicine) Normajean Glasgow, MD as Attending Physician (Physical Medicine and Rehabilitation) Phylliss Bob, MD as Consulting Physician (Orthopedic Surgery) Juanita Craver, MD as Consulting Physician (Gastroenterology)  Allergies Allergies  Allergen Reactions  . Penicillins Shortness Of Breath    And hives  . Aspirin Other (See Comments)    Stomach upset, but low dose is ok  . Hydromorphone Nausea Only    Other reaction(s): Sweating (intolerance)  . Sulfa Antibiotics Other (See Comments)    unknown  . Codeine Rash    SURGICAL HISTORY She  has a past surgical history that includes Carpal tunnel release (2012); Wrist surgery; Breast surgery (20+yrs ago); Tonsillectomy (30+yrs ago); Abdominal hysterectomy (1975); Refractive surgery (2011);  Colonoscopy; and Anterior cervical decomp/discectomy fusion (07/31/2011). FAMILY HISTORY Her family history includes Cancer (age of onset: 79) in her mother; Glaucoma in her father; HIV in her son; Hypertension in her sister; Leukemia in her father; Mitral valve prolapse in her sister; Pneumonia in her father. SOCIAL HISTORY She  reports that she has never smoked. She has never used smokeless tobacco. She reports that she does not drink alcohol or use drugs.   Objective:   Today's Vitals   12/31/16 0957  BP: 116/80  Pulse: 84  Resp: 16  Temp: (!) 97.3 F (36.3 C)  SpO2: 99%  Weight: 179 lb 9.6 oz (81.5 kg)  Height: 5' 6"  (1.676 m)   Body mass index is 28.99 kg/m.  General appearance: alert, no distress, WD/WN, female HEENT: normocephalic, sclerae anicteric, TMs pearly, nares patent, no discharge or erythema, pharynx normal Oral cavity: MMM, no lesions Neck: supple, no lymphadenopathy, no thyromegaly, no masses Heart: RRR, normal S1, S2, no murmurs Lungs: CTA bilaterally, no wheezes, rhonchi, or rales Abdomen: +bs, soft, non tender, non distended, no masses, no hepatomegaly, no splenomegaly Musculoskeletal: nontender, no swelling, no obvious deformity Extremities: no edema, no cyanosis, no clubbing Pulses: 2+ symmetric, upper and lower extremities, normal cap refill Neurological: alert, oriented x 3, CN2-12 intact, strength normal upper extremities and lower extremities, sensation normal throughout, DTRs 2+ throughout, no cerebellar signs, gait normal Psychiatric: normal affect, behavior normal, pleasant    Vicie Mutters, PA-C   12/31/2016

## 2016-12-31 NOTE — Patient Instructions (Addendum)
Get your mammogram after 10/25 [facility]  7 a.m.-6:30 p.m., Monday 7 a.m.-5 p.m., Tuesday-Friday Schedule an appointment by calling [phone].  STOP THE FOSAMAX FOR NOW WILL GET DEXA WITH YOUR MAMMOGRAM     Bad carbs also include fruit juice, alcohol, and sweet tea. These are empty calories that do not signal to your brain that you are full.   Please remember the good carbs are still carbs which convert into sugar. So please measure them out no more than 1/2-1 cup of rice, oatmeal, pasta, and beans  Veggies are however free foods! Pile them on.   Not all fruit is created equal. Please see the list below, the fruit at the bottom is higher in sugars than the fruit at the top. Please avoid all dried fruits.      Try the exercises and other information in the back care manual,  then baclofen if needed at bedtime for muscle spasm. This can be taken up to every 8 hours, but causes sedation, so should not drive or operate heavy machinery while taking this medicine.   Go to the ER if you have any new weakness in your legs, have trouble controlling your urine or bowels, or have worsening pain.   If you are not better in 1-3 month we will refer you to ortho   Back pain Rehab Ask your health care provider which exercises are safe for you. Do exercises exactly as told by your health care provider and adjust them as directed. It is normal to feel mild stretching, pulling, tightness, or discomfort as you do these exercises, but you should stop right away if you feel sudden pain or your pain gets worse.Do not begin these exercises until told by your health care provider. Stretching and range of motion exercises These exercises warm up your muscles and joints and improve the movement and flexibility of your hips and your back. These exercises also help to relieve pain, numbness, and tingling. Exercise A: Sciatic nerve glide 5. Sit in a chair with your head facing  down toward your chest. Place your hands behind your back. Let your shoulders slump forward. 6. Slowly straighten one of your knees while you tilt your head back as if you are looking toward the ceiling. Only straighten your leg as far as you can without making your symptoms worse. 7. Hold for __________ seconds. 8. Slowly return to the starting position. 9. Repeat with your other leg. Repeat __________ times. Complete this exercise __________ times a day. Exercise B: Knee to chest with hip adduction and internal rotation  1. Lie on your back on a firm surface with both legs straight. 2. Bend one of your knees and move it up toward your chest until you feel a gentle stretch in your lower back and buttock. Then, move your knee toward the shoulder that is on the opposite side from your leg. ? Hold your leg in this position by holding onto the front of your knee. 3. Hold for __________ seconds. 4. Slowly return to the starting position. 5. Repeat with your other leg. Repeat __________ times. Complete this exercise __________ times a day. Exercise C: Prone extension on elbows  1. Lie on your abdomen on a firm surface. A bed may be too soft for this exercise. 2. Prop yourself up on your elbows. 3. Use your arms to help lift your chest up until you feel a gentle stretch in your abdomen and your lower back. ?  This will place some of your body weight on your elbows. If this is uncomfortable, try stacking pillows under your chest. ? Your hips should stay down, against the surface that you are lying on. Keep your hip and back muscles relaxed. 4. Hold for __________ seconds. 5. Slowly relax your upper body and return to the starting position. Repeat __________ times. Complete this exercise __________ times a day. Strengthening exercises These exercises build strength and endurance in your back. Endurance is the ability to use your muscles for a long time, even after they get tired. Exercise D: Pelvic  tilt 1. Lie on your back on a firm surface. Bend your knees and keep your feet flat. 2. Tense your abdominal muscles. Tip your pelvis up toward the ceiling and flatten your lower back into the floor. ? To help with this exercise, you may place a small towel under your lower back and try to push your back into the towel. 3. Hold for __________ seconds. 4. Let your muscles relax completely before you repeat this exercise. Repeat __________ times. Complete this exercise __________ times a day. Exercise E: Alternating arm and leg raises  1. Get on your hands and knees on a firm surface. If you are on a hard floor, you may want to use padding to cushion your knees, such as an exercise mat. 2. Line up your arms and legs. Your hands should be below your shoulders, and your knees should be below your hips. 3. Lift your left leg behind you. At the same time, raise your right arm and straighten it in front of you. ? Do not lift your leg higher than your hip. ? Do not lift your arm higher than your shoulder. ? Keep your abdominal and back muscles tight. ? Keep your hips facing the ground. ? Do not arch your back. ? Keep your balance carefully, and do not hold your breath. 4. Hold for __________ seconds. 5. Slowly return to the starting position and repeat with your right leg and your left arm. Repeat __________ times. Complete this exercise __________ times a day. Posture and body mechanics  Body mechanics refers to the movements and positions of your body while you do your daily activities. Posture is part of body mechanics. Good posture and healthy body mechanics can help to relieve stress in your body's tissues and joints. Good posture means that your spine is in its natural S-curve position (your spine is neutral), your shoulders are pulled back slightly, and your head is not tipped forward. The following are general guidelines for applying improved posture and body mechanics to your everyday  activities. Standing   When standing, keep your spine neutral and your feet about hip-width apart. Keep a slight bend in your knees. Your ears, shoulders, and hips should line up.  When you do a task in which you stand in one place for a long time, place one foot up on a stable object that is 2-4 inches (5-10 cm) high, such as a footstool. This helps keep your spine neutral. Sitting   When sitting, keep your spine neutral and keep your feet flat on the floor. Use a footrest, if necessary, and keep your thighs parallel to the floor. Avoid rounding your shoulders, and avoid tilting your head forward.  When working at a desk or a computer, keep your desk at a height where your hands are slightly lower than your elbows. Slide your chair under your desk so you are close enough to maintain good  posture.  When working at a computer, place your monitor at a height where you are looking straight ahead and you do not have to tilt your head forward or downward to look at the screen. Resting   When lying down and resting, avoid positions that are most painful for you.  If you have pain with activities such as sitting, bending, stooping, or squatting (flexion-based activities), lie in a position in which your body does not bend very much. For example, avoid curling up on your side with your arms and knees near your chest (fetal position).  If you have pain with activities such as standing for a long time or reaching with your arms (extension-based activities), lie with your spine in a neutral position and bend your knees slightly. Try the following positions: ? Lying on your side with a pillow between your knees. ? Lying on your back with a pillow under your knees. Lifting   When lifting objects, keep your feet at least shoulder-width apart and tighten your abdominal muscles.  Bend your knees and hips and keep your spine neutral. It is important to lift using the strength of your legs, not your  back. Do not lock your knees straight out.  Always ask for help to lift heavy or awkward objects. This information is not intended to replace advice given to you by your health care provider. Make sure you discuss any questions you have with your health care provider. Document Released: 03/04/2005 Document Revised: 11/09/2015 Document Reviewed: 11/18/2014 Elsevier Interactive Patient Education  [employer].

## 2017-01-01 LAB — CBC WITH DIFFERENTIAL/PLATELET
BASOS PCT: 0.8 %
Basophils Absolute: 32 cells/uL (ref 0–200)
EOS ABS: 112 {cells}/uL (ref 15–500)
Eosinophils Relative: 2.8 %
HCT: 35.9 % (ref 35.0–45.0)
HEMOGLOBIN: 11.8 g/dL (ref 11.7–15.5)
Lymphs Abs: 1436 cells/uL (ref 850–3900)
MCH: 29.1 pg (ref 27.0–33.0)
MCHC: 32.9 g/dL (ref 32.0–36.0)
MCV: 88.4 fL (ref 80.0–100.0)
MONOS PCT: 9.3 %
MPV: 9.7 fL (ref 7.5–12.5)
NEUTROS ABS: 2048 {cells}/uL (ref 1500–7800)
Neutrophils Relative %: 51.2 %
Platelets: 268 10*3/uL (ref 140–400)
RBC: 4.06 10*6/uL (ref 3.80–5.10)
RDW: 13.3 % (ref 11.0–15.0)
Total Lymphocyte: 35.9 %
WBC mixed population: 372 cells/uL (ref 200–950)
WBC: 4 10*3/uL (ref 3.8–10.8)

## 2017-01-01 LAB — IRON, TOTAL/TOTAL IRON BINDING CAP
%SAT: 32 % (calc) (ref 11–50)
Iron: 95 ug/dL (ref 45–160)
TIBC: 298 mcg/dL (calc) (ref 250–450)

## 2017-01-01 LAB — BASIC METABOLIC PANEL WITH GFR
BUN: 10 mg/dL (ref 7–25)
CO2: 27 mmol/L (ref 20–32)
CREATININE: 0.83 mg/dL (ref 0.50–0.99)
Calcium: 10 mg/dL (ref 8.6–10.4)
Chloride: 105 mmol/L (ref 98–110)
GFR, Est African American: 84 mL/min/{1.73_m2} (ref >=60)
GFR, Est Non African American: 72 mL/min/{1.73_m2} (ref >=60)
GLUCOSE: 96 mg/dL (ref 65–99)
Potassium: 4 mmol/L (ref 3.5–5.3)
Sodium: 139 mmol/L (ref 135–146)

## 2017-01-01 LAB — URINALYSIS, ROUTINE W REFLEX MICROSCOPIC
Bilirubin Urine: NEGATIVE
GLUCOSE, UA: NEGATIVE
Hgb urine dipstick: NEGATIVE
Ketones, ur: NEGATIVE
LEUKOCYTES UA: NEGATIVE
Nitrite: NEGATIVE
PH: 5.5 (ref 5.0–8.0)
PROTEIN: NEGATIVE
Specific Gravity, Urine: 1.013 (ref 1.001–1.03)

## 2017-01-01 LAB — LIPID PANEL
CHOL/HDL RATIO: 3.3 (calc) (ref <5.0)
CHOLESTEROL: 164 mg/dL (ref <200)
HDL: 49 mg/dL — AB (ref >50)
LDL CHOLESTEROL (CALC): 91 mg/dL
Non-HDL Cholesterol (Calc): 115 mg/dL (calc) (ref <130)
TRIGLYCERIDES: 139 mg/dL (ref <150)

## 2017-01-01 LAB — HEMOGLOBIN A1C
Hgb A1c MFr Bld: 6.4 % of total Hgb — ABNORMAL HIGH (ref <5.7)
Mean Plasma Glucose: 137 (calc)
eAG (mmol/L): 7.6 (calc)

## 2017-01-01 LAB — HEPATIC FUNCTION PANEL
AG RATIO: 2 (calc) (ref 1.0–2.5)
ALBUMIN MSPROF: 4.3 g/dL (ref 3.6–5.1)
ALT: 20 U/L (ref 6–29)
AST: 17 U/L (ref 10–35)
Alkaline phosphatase (APISO): 52 U/L (ref 33–130)
BILIRUBIN INDIRECT: 0.4 mg/dL (ref 0.2–1.2)
Bilirubin, Direct: 0.1 mg/dL (ref 0.0–0.2)
GLOBULIN: 2.1 g/dL (ref 1.9–3.7)
TOTAL PROTEIN: 6.4 g/dL (ref 6.1–8.1)
Total Bilirubin: 0.5 mg/dL (ref 0.2–1.2)

## 2017-01-01 LAB — MICROALBUMIN / CREATININE URINE RATIO
Creatinine, Urine: 85 mg/dL (ref 20–275)
MICROALB/CREAT RATIO: 2 ug/mg{creat} (ref <30)
Microalb, Ur: 0.2 mg/dL

## 2017-01-01 LAB — MAGNESIUM: Magnesium: 2.1 mg/dL (ref 1.5–2.5)

## 2017-01-01 LAB — TSH: TSH: 1.35 mIU/L (ref 0.40–4.50)

## 2017-01-01 LAB — VITAMIN D 25 HYDROXY (VIT D DEFICIENCY, FRACTURES): Vit D, 25-Hydroxy: 58 ng/mL (ref 30–100)

## 2017-01-01 LAB — VITAMIN B12: VITAMIN B 12: 573 pg/mL (ref 200–1100)

## 2017-01-06 ENCOUNTER — Encounter: Payer: Self-pay | Admitting: Physician Assistant

## 2017-01-07 ENCOUNTER — Encounter: Payer: Self-pay | Admitting: Physician Assistant

## 2017-01-07 DIAGNOSIS — E1139 Type 2 diabetes mellitus with other diabetic ophthalmic complication: Secondary | ICD-10-CM

## 2017-01-07 LAB — HM DIABETES EYE EXAM

## 2017-01-08 DIAGNOSIS — H401133 Primary open-angle glaucoma, bilateral, severe stage: Secondary | ICD-10-CM | POA: Diagnosis not present

## 2017-01-15 ENCOUNTER — Encounter: Payer: Self-pay | Admitting: Internal Medicine

## 2017-02-05 DIAGNOSIS — R69 Illness, unspecified: Secondary | ICD-10-CM | POA: Diagnosis not present

## 2017-02-10 LAB — HM DEXA SCAN

## 2017-02-10 LAB — HM MAMMOGRAPHY

## 2017-02-19 ENCOUNTER — Encounter: Payer: Self-pay | Admitting: *Deleted

## 2017-04-08 DIAGNOSIS — H401133 Primary open-angle glaucoma, bilateral, severe stage: Secondary | ICD-10-CM | POA: Diagnosis not present

## 2017-04-11 ENCOUNTER — Encounter: Payer: Self-pay | Admitting: Internal Medicine

## 2017-04-11 ENCOUNTER — Other Ambulatory Visit: Payer: Self-pay | Admitting: Internal Medicine

## 2017-04-11 ENCOUNTER — Other Ambulatory Visit: Payer: Self-pay | Admitting: Physician Assistant

## 2017-04-11 ENCOUNTER — Ambulatory Visit (INDEPENDENT_AMBULATORY_CARE_PROVIDER_SITE_OTHER): Payer: Medicare HMO | Admitting: Internal Medicine

## 2017-04-11 VITALS — BP 106/62 | HR 76 | Temp 97.7°F | Resp 16 | Ht 66.0 in | Wt 172.2 lb

## 2017-04-11 DIAGNOSIS — M533 Sacrococcygeal disorders, not elsewhere classified: Secondary | ICD-10-CM

## 2017-04-11 DIAGNOSIS — K219 Gastro-esophageal reflux disease without esophagitis: Secondary | ICD-10-CM | POA: Diagnosis not present

## 2017-04-11 DIAGNOSIS — M25511 Pain in right shoulder: Secondary | ICD-10-CM | POA: Diagnosis not present

## 2017-04-11 DIAGNOSIS — Z79899 Other long term (current) drug therapy: Secondary | ICD-10-CM | POA: Diagnosis not present

## 2017-04-11 DIAGNOSIS — M810 Age-related osteoporosis without current pathological fracture: Secondary | ICD-10-CM

## 2017-04-11 DIAGNOSIS — I10 Essential (primary) hypertension: Secondary | ICD-10-CM | POA: Diagnosis not present

## 2017-04-11 DIAGNOSIS — E1122 Type 2 diabetes mellitus with diabetic chronic kidney disease: Secondary | ICD-10-CM

## 2017-04-11 DIAGNOSIS — G8929 Other chronic pain: Secondary | ICD-10-CM | POA: Diagnosis not present

## 2017-04-11 DIAGNOSIS — M47816 Spondylosis without myelopathy or radiculopathy, lumbar region: Secondary | ICD-10-CM

## 2017-04-11 DIAGNOSIS — E559 Vitamin D deficiency, unspecified: Secondary | ICD-10-CM | POA: Diagnosis not present

## 2017-04-11 DIAGNOSIS — E782 Mixed hyperlipidemia: Secondary | ICD-10-CM | POA: Diagnosis not present

## 2017-04-11 DIAGNOSIS — M25512 Pain in left shoulder: Secondary | ICD-10-CM

## 2017-04-11 DIAGNOSIS — N182 Chronic kidney disease, stage 2 (mild): Secondary | ICD-10-CM | POA: Diagnosis not present

## 2017-04-11 NOTE — Patient Instructions (Signed)

## 2017-04-11 NOTE — Progress Notes (Signed)
This very nice 72 y.o. single BF presents for 3 month follow up with Hypertension, Hyperlipidemia, Pre-Diabetes and Vitamin D Deficiency. Patient has GERD controlled on her Ranitidine.      Patient has long hx/o shoulder pains predating back at least to 2012 & has been seen by different Orthopedists in the past. She reports worsening bilat shoulder pains over the last 3-4 months. She indicates desire for a different 2sd Orthopedic opinion.     Patient is treated for HTN  Circa 2003 & BP has been controlled at home. Today's BP is at goal - 106/62. Patient has had no complaints of any cardiac type chest pain, palpitations, dyspnea / orthopnea / PND, dizziness, claudication, or dependent edema.     Hyperlipidemia is not controlled with diet & meds. Last Lipids were not at goal likely due to non med compliance: Lab Results  Component Value Date   CHOL 204 (H) 04/11/2017   HDL 61 04/11/2017   LDLCALC 133 (H) 05/21/2016   TRIG 88 04/11/2017   CHOLHDL 3.3 04/11/2017      Also, the patient has history of T2_NIDDM (2011) w/CKD2 (GFR 84)  and has had no symptoms of reactive hypoglycemia, diabetic polys, paresthesias or visual blurring.  She alleges most recent FBG's range 90-100 mg % ! Last A1c was not at goal: Lab Results  Component Value Date   HGBA1C 6.6 (H) 04/11/2017      Further, the patient also has history of Vitamin D Deficiency and supplements vitamin D without any suspected side-effects. Last vitamin D was not at goal:   Lab Results  Component Value Date   VD25OH 64 04/11/2017   Current Outpatient Medications on File Prior to Visit  Medication Sig  . TYLENOL ARTHRITIS PAIN 650 MG CR Take 1,300 mg by mouth every 8 (eight) hours as needed. For pain  . albuterol  HFA  inhaler 2 puffs  every 6 (six) hours as needed  . alendronate 70 MG tablet TAKE 1 TAB ONCE A WEEK  . VITAMIN C  1,000 mg  Take daily.   Marland Kitchen aspirin EC 81 MG  Take  daily.  . Atorvastatin 40 MG tablet TAKE 1 TABLET  EVERY DAY  . benazepril-hctz 20-25 MG  TAKE 1 TABLET EVERY DAY  . BETOPTIC-S  ophth susp Place 1 drop into both eyes 2 (two) times daily.  . AZOPT ophth susp Place 1 drop into both eyes 2 (two) times daily.  Marland Kitchen SIMBRINZA SUSP Apply 2 drops each eye daily  . CALCIUM  500 mg Take  daily.   . COD LIVER OIL Take  daily.  Marland Kitchen FLONASE   nasal spray 2 SPRAYS IN EACH NOSTRIL DAILY  . gabapentin  100 MG cap TAKE 1 CAPSULE THREE TIMES DAILY   Metformin 500 mg XR   Takes 1-2 tablets 2 x/ daily   . Multi-Vit w/minerals Take 1 tablet by mouth daily.  . ranitidine  300 MG  TAKE 1 TABLET AT BEDTIME  . BETIMOL ophth soln Place 1 drop into both eyes 2 (two) times daily.  Dorette Grate  ophth soln 1 drop at bedtime.  . VOLTAREN 1 % GEL APPLY  2  TO  4  GRAMS TOPICALLY FOUR TIMES DAILY   Allergies  Allergen Reactions  . Penicillins Shortness Of Breath    And hives  . Aspirin Other (See Comments)    Stomach upset, but low dose is ok  . Hydromorphone Nausea Only  Other reaction(s): Sweating (intolerance)  . Sulfa Antibiotics Other (See Comments)    unknown  . Codeine Rash   PMHx:   Past Medical History:  Diagnosis Date  . Asthma   . Bronchitis    hx of;>92yrs ago  . Chronic neck pain    arthritis  . COPD (chronic obstructive pulmonary disease) ([place])   . Degenerative disc disease    neck  . DeQuervain's disease (tenosynovitis)   . Diverticulitis   . Dizziness    related to neck issues  . Frozen shoulder September 2012  . GERD (gastroesophageal reflux disease)    takes Ranitidine nightly  . Glaucoma   . Hay fever   . Headache(784.0)    related neck issues  . High cholesterol    takes Lipitor daily  . History of shingles 2012  . Hypertension    takes Lotensin HCT daily  . Insomnia    d/t pain and takes Ativan nightly  . Osteoporosis   . Sarcoidosis >75yrs ago   no problems since  . Type II or unspecified type diabetes mellitus without mention of complication, not stated as uncontrolled     takes Metformin nightly   Immunization History  Administered Date(s) Administered  . Influenza, High Dose Seasonal PF 01/04/2015, 12/31/2016  . Influenza-Unspecified 03/01/2016  . Pneumococcal Conjugate-13 11/16/2013  . Pneumococcal Polysaccharide-23 04/12/2015  . Pneumococcal-Unspecified 03/18/2005  . Tdap 03/18/2009  . Zoster 11/13/2012   Past Surgical History:  Procedure Laterality Date  . ABDOMINAL HYSTERECTOMY  1975  . ANTERIOR CERVICAL DECOMP/DISCECTOMY FUSION  07/31/2011   Procedure: ANTERIOR CERVICAL DECOMPRESSION/DISCECTOMY FUSION 3 LEVELS;  Surgeon: Hosie Spangle, MD;  Location: [facility];  Service: Neurosurgery;  Laterality: N/A;  Cervical four-five,Cervical five-six,Cervical six-seven anterior cervical decompression with fusion plating and bonegraft  . BREAST SURGERY  20+yrs ago   left breast;benign  . CARPAL TUNNEL RELEASE  2012  . COLONOSCOPY    . REFRACTIVE SURGERY  2011   bilateral  . TONSILLECTOMY  30+yrs ago  . WRIST SURGERY     ORIF left wrist and then removed d/t protruding screw in 2012   FHx:    Reviewed / unchanged  SHx:    Reviewed / unchanged  Systems Review:  Constitutional: Denies fever, chills, wt changes, headaches, insomnia, fatigue, night sweats, change in appetite. Eyes: Denies redness, blurred vision, diplopia, discharge, itchy, watery eyes.  ENT: Denies discharge, congestion, post nasal drip, epistaxis, sore throat, earache, hearing loss, dental pain, tinnitus, vertigo, sinus pain, snoring.  CV: Denies chest pain, palpitations, irregular heartbeat, syncope, dyspnea, diaphoresis, orthopnea, PND, claudication or edema. Respiratory: denies cough, dyspnea, DOE, pleurisy, hoarseness, laryngitis, wheezing.  Gastrointestinal: Denies dysphagia, odynophagia, heartburn, reflux, water brash, abdominal pain or cramps, nausea, vomiting, bloating, diarrhea, constipation, hematemesis, melena, hematochezia  or hemorrhoids. Genitourinary: Denies  dysuria, frequency, urgency, nocturia, hesitancy, discharge, hematuria or flank pain. Musculoskeletal: Denies arthralgias, myalgias, stiffness, jt. swelling, pain, limping or strain/sprain.  Skin: Denies pruritus, rash, hives, warts, acne, eczema or change in skin lesion(s). Neuro: No weakness, tremor, incoordination, spasms, paresthesia or pain. Psychiatric: Denies confusion, memory loss or sensory loss. Endo: Denies change in weight, skin or hair change.  Heme/Lymph: No excessive bleeding, bruising or enlarged lymph nodes.  Physical Exam  BP 106/62   Pulse 76   Temp 97.7 F (36.5 C)   Resp 16   Ht 5\' 6"  (1.676 m)   Wt 172 lb 3.2 oz (78.1 kg)   BMI 27.79 kg/m   Appears well nourished,  well groomed  and in no distress.  Eyes: PERRLA, EOMs, conjunctiva no swelling or erythema. Sinuses: No frontal/maxillary tenderness ENT/Mouth: EAC's clear, TM's nl w/o erythema, bulging. Nares clear w/o erythema, swelling, exudates. Oropharynx clear without erythema or exudates. Oral hygiene is good. Tongue normal, non obstructing. Hearing intact.  Neck: Supple. Thyroid nl. Car 2+/2+ without bruits, nodes or JVD. Chest: Respirations nl with BS clear & equal w/o rales, rhonchi, wheezing or stridor.  Cor: Heart sounds normal w/ regular rate and rhythm without sig. murmurs, gallops, clicks or rubs. Peripheral pulses normal and equal  without edema.  Abdomen: Soft & bowel sounds normal. Non-tender w/o guarding, rebound, hernias, masses or organomegaly.  Lymphatics: Unremarkable.  Musculoskeletal: Full ROM all peripheral extremities, joint stability, 5/5 strength and normal gait.  Skin: Warm, dry without exposed rashes, lesions or ecchymosis apparent.  Neuro: Cranial nerves intact, reflexes equal bilaterally. Sensory-motor testing grossly intact. Tendon reflexes grossly intact.  Pysch: Alert & oriented x 3.  Insight and judgement nl & appropriate. No ideations.  Assessment and Plan:  1. Essential  hypertension  - Continue medication, monitor blood pressure at home.  - Continue DASH diet. Reminder to go to the ER if any CP,  SOB, nausea, dizziness, severe HA, changes vision/speech.  - CBC with Differential/Platelet - BASIC METABOLIC PANEL WITH GFR - Magnesium - TSH  2. Hyperlipidemia, mixed  - Continue diet/meds, exercise,& lifestyle modifications.  - Continue monitor periodic cholesterol/liver & renal functions   - Hepatic function panel - Lipid panel - TSH  3. Type 2 diabetes mellitus with stage 2 chronic kidney disease, without long-term current use of insulin (HCC)  - Continue diet, exercise, lifestyle modifications.  - Monitor appropriate labs.  - Insulin, random  4. Vitamin D deficiency  - Continue supplementation.  - VITAMIN D 25 Hydroxy  5. Gastroesophageal reflux disease  - CBC with Differential/Platelet  6. Chronic pain of both shoulders  - Ambulatory referral to Orthopedic Surgery  7. Osteoporosis   8. Medication management  - CBC with Differential/Platelet - BASIC METABOLIC PANEL WITH GFR - Hepatic function panel - Magnesium - Lipid panel - TSH - Hemoglobin A1c - Insulin, random - VITAMIN D 25 Hydroxy         Discussed  regular exercise, BP monitoring, weight control to achieve/maintain BMI less than 25 and discussed med and SE's. Recommended labs to assess and monitor clinical status with further disposition pending results of labs. Over 30 minutes of exam, counseling, chart review was performed.

## 2017-04-12 ENCOUNTER — Other Ambulatory Visit: Payer: Self-pay | Admitting: Internal Medicine

## 2017-04-13 MED ORDER — METFORMIN HCL ER 500 MG PO TB24: ORAL_TABLET | ORAL | 1 refills | Status: DC

## 2017-04-14 ENCOUNTER — Encounter: Payer: Self-pay | Admitting: Physician Assistant

## 2017-04-14 DIAGNOSIS — M810 Age-related osteoporosis without current pathological fracture: Secondary | ICD-10-CM | POA: Insufficient documentation

## 2017-04-14 LAB — CBC WITH DIFFERENTIAL/PLATELET
Basophils Absolute: 18 cells/uL (ref 0–200)
Basophils Relative: 0.4 %
Eosinophils Absolute: 60 cells/uL (ref 15–500)
Eosinophils Relative: 1.3 %
HEMATOCRIT: 39.8 % (ref 35.0–45.0)
HEMOGLOBIN: 13.4 g/dL (ref 11.7–15.5)
LYMPHS ABS: 1490 {cells}/uL (ref 850–3900)
MCH: 29.8 pg (ref 27.0–33.0)
MCHC: 33.7 g/dL (ref 32.0–36.0)
MCV: 88.4 fL (ref 80.0–100.0)
MPV: 10.4 fL (ref 7.5–12.5)
Monocytes Relative: 7.8 %
NEUTROS PCT: 58.1 %
Neutro Abs: 2673 cells/uL (ref 1500–7800)
Platelets: 288 10*3/uL (ref 140–400)
RBC: 4.5 10*6/uL (ref 3.80–5.10)
RDW: 13 % (ref 11.0–15.0)
Total Lymphocyte: 32.4 %
WBC: 4.6 10*3/uL (ref 3.8–10.8)
WBCMIX: 359 {cells}/uL (ref 200–950)

## 2017-04-14 LAB — INSULIN, RANDOM: INSULIN: 13.9 u[IU]/mL (ref 2.0–19.6)

## 2017-04-14 LAB — HEPATIC FUNCTION PANEL
AG RATIO: 1.8 (calc) (ref 1.0–2.5)
ALBUMIN MSPROF: 4.6 g/dL (ref 3.6–5.1)
ALT: 14 U/L (ref 6–29)
AST: 16 U/L (ref 10–35)
Alkaline phosphatase (APISO): 57 U/L (ref 33–130)
BILIRUBIN INDIRECT: 0.5 mg/dL (ref 0.2–1.2)
Bilirubin, Direct: 0.1 mg/dL (ref 0.0–0.2)
GLOBULIN: 2.6 g/dL (ref 1.9–3.7)
TOTAL PROTEIN: 7.2 g/dL (ref 6.1–8.1)
Total Bilirubin: 0.6 mg/dL (ref 0.2–1.2)

## 2017-04-14 LAB — BASIC METABOLIC PANEL WITH GFR
BUN: 14 mg/dL (ref 7–25)
CALCIUM: 10.3 mg/dL (ref 8.6–10.4)
CO2: 27 mmol/L (ref 20–32)
CREATININE: 0.95 mg/dL (ref 0.50–0.99)
Chloride: 104 mmol/L (ref 98–110)
GFR, EST AFRICAN AMERICAN: 71 mL/min/{1.73_m2} (ref >=60)
GFR, EST NON AFRICAN AMERICAN: 61 mL/min/{1.73_m2} (ref >=60)
Glucose, Bld: 135 mg/dL — ABNORMAL HIGH (ref 65–99)
POTASSIUM: 4.4 mmol/L (ref 3.5–5.3)
Sodium: 140 mmol/L (ref 135–146)

## 2017-04-14 LAB — LIPID PANEL
CHOL/HDL RATIO: 3.3 (calc) (ref <5.0)
CHOLESTEROL: 204 mg/dL — AB (ref <200)
HDL: 61 mg/dL (ref >50)
LDL CHOLESTEROL (CALC): 124 mg/dL — AB
Non-HDL Cholesterol (Calc): 143 mg/dL (calc) — ABNORMAL HIGH (ref <130)
TRIGLYCERIDES: 88 mg/dL (ref <150)

## 2017-04-14 LAB — VITAMIN D 25 HYDROXY (VIT D DEFICIENCY, FRACTURES): VIT D 25 HYDROXY: 64 ng/mL (ref 30–100)

## 2017-04-14 LAB — TSH: TSH: 1.39 mIU/L (ref 0.40–4.50)

## 2017-04-14 LAB — MAGNESIUM: MAGNESIUM: 2 mg/dL (ref 1.5–2.5)

## 2017-04-14 LAB — HEMOGLOBIN A1C
EAG (MMOL/L): 7.9 (calc)
Hgb A1c MFr Bld: 6.6 % of total Hgb — ABNORMAL HIGH (ref <5.7)
Mean Plasma Glucose: 143 (calc)

## 2017-04-22 ENCOUNTER — Other Ambulatory Visit: Payer: Self-pay | Admitting: Physician Assistant

## 2017-04-22 MED ORDER — PROMETHAZINE-DM 6.25-15 MG/5ML PO SYRP: 5.0000 mL | ORAL_SOLUTION | Freq: Four times a day (QID) | ORAL | 0 refills | Status: DC | PRN

## 2017-05-09 ENCOUNTER — Encounter (INDEPENDENT_AMBULATORY_CARE_PROVIDER_SITE_OTHER): Payer: Self-pay

## 2017-05-09 ENCOUNTER — Other Ambulatory Visit: Payer: Self-pay | Admitting: Internal Medicine

## 2017-05-09 MED ORDER — GABAPENTIN 600 MG PO TABS: ORAL_TABLET | ORAL | 1 refills | Status: DC

## 2017-05-13 ENCOUNTER — Ambulatory Visit (INDEPENDENT_AMBULATORY_CARE_PROVIDER_SITE_OTHER): Payer: Medicare HMO | Admitting: Orthopaedic Surgery

## 2017-05-13 ENCOUNTER — Ambulatory Visit (INDEPENDENT_AMBULATORY_CARE_PROVIDER_SITE_OTHER): Payer: Medicare HMO

## 2017-05-13 ENCOUNTER — Encounter (INDEPENDENT_AMBULATORY_CARE_PROVIDER_SITE_OTHER): Payer: Self-pay | Admitting: Orthopaedic Surgery

## 2017-05-13 VITALS — BP 117/76 | HR 82 | Ht 66.0 in | Wt 170.0 lb

## 2017-05-13 DIAGNOSIS — M542 Cervicalgia: Secondary | ICD-10-CM | POA: Diagnosis not present

## 2017-05-13 DIAGNOSIS — M25512 Pain in left shoulder: Secondary | ICD-10-CM

## 2017-05-13 DIAGNOSIS — M25511 Pain in right shoulder: Secondary | ICD-10-CM | POA: Diagnosis not present

## 2017-05-13 DIAGNOSIS — G8929 Other chronic pain: Secondary | ICD-10-CM

## 2017-05-13 MED ORDER — BUPIVACAINE HCL 0.25 % IJ SOLN
4.0000 mL | INTRAMUSCULAR | Status: AC | PRN
  Administered 2017-05-13: 4 mL via INTRA_ARTICULAR

## 2017-05-13 MED ORDER — LIDOCAINE HCL 1 % IJ SOLN
0.5000 mL | INTRAMUSCULAR | Status: AC | PRN
  Administered 2017-05-13: .5 mL

## 2017-05-13 MED ORDER — METHYLPREDNISOLONE ACETATE 40 MG/ML IJ SUSP
40.0000 mg | INTRAMUSCULAR | Status: AC | PRN
  Administered 2017-05-13: 40 mg via INTRA_ARTICULAR

## 2017-05-13 NOTE — Addendum Note (Signed)
Addended by: Meyer Cory on: 05/13/2017 04:12 PM   Modules accepted: Orders

## 2017-05-13 NOTE — Progress Notes (Signed)
Office Visit Note   Patient: Sara [name]           Date of Birth: 1945-08-15           MRN: [id number] Visit Date: 05/13/2017              Requested by: Unk Pinto, [address] [place], [place] [address] PCP: Unk Pinto, MD   Assessment & Plan: Visit Diagnoses:  1. Neck pain   2. Chronic pain of both shoulders     Plan: recheck 3 wks. . Therapy ordered. Left subacromial injection done. She will watch her CBG closely.   Follow-Up Instructions: Return in about 3 weeks (around 06/03/2017).   Orders:  Orders Placed This Encounter  Procedures  . Large Joint Inj: L subacromial bursa  . XR Cervical Spine 2 or 3 views  . XR Shoulder Right  . XR Shoulder Left   No orders of the defined types were placed in this encounter.     Procedures: Large Joint Inj: L subacromial bursa on 05/13/2017 10:17 AM Indications: pain Details: 22 G 1.5 in needle  Arthrogram: No  Medications: 4 mL bupivacaine 0.25 %; 40 mg methylPREDNISolone acetate 40 MG/ML; 0.5 mL lidocaine 1 % Outcome: tolerated well, no immediate complications Procedure, treatment alternatives, risks and benefits explained, specific risks discussed. Consent was given by the patient. Immediately prior to procedure a time out was called to verify the correct patient, procedure, equipment, support staff and site/side marked as required. Patient was prepped and draped in the usual sterile fashion.       Clinical Data: No additional findings.   Subjective: Chief Complaint  Patient presents with  . Neck - Pain  . Right Shoulder - Pain  . Left Shoulder - Pain    HPI 72 year old female complains of bilateral shoulder pain.  Previous 3 level cervical fusion C4-C7 by Dr. Sherwood Gambler in 2013 with complete healing by CT scan and MRI showing some adjacent level with mild changes.  Patient has some numbness and tingling in her left lateral 2 fingers.  She has problems getting her arms up overhead  her symptoms tend to come and go at times she is not able to do her yard work.  She is use Vicks VapoRub.  Some pain radiates between her shoulder blades.  She can only abduct her shoulders to about 80 degrees with more pain on the left than right.  She has problems with dressing.  No lower extremity gait disturbance.  Old MRI shoulder 2014  Showed tendonopat.hy without full thickness tear.   Review of Systems 14 point review of systems positive for previous 3 level cervical fusion, carpal tunnel surgery on the left.  Patient's a non-smoker, positive for COPD.  Positive for acid reflux anxiety, type 2 diabetes asthma, bleeding disorder, bronchitis, diabetes, glaucoma, goiter, osteoporosis, endometriosis and pneumonia.  Otherwise negative as it pertains HPI.  Objective: Vital Signs: BP 117/76   Pulse 82   Ht 5\' 6"  (1.676 m)   Wt 170 lb (77.1 kg)   BMI 27.44 kg/m   Physical Exam  Constitutional: She is oriented to person, place, and time. She appears well-developed.  HENT:  Head: Normocephalic.  Right Ear: External ear normal.  Left Ear: External ear normal.  Eyes: Pupils are equal, round, and reactive to light.  Neck: No tracheal deviation present. No thyromegaly present.  Cardiovascular: Normal rate.  Pulmonary/Chest: Effort normal.  Abdominal: Soft.  Neurological: She is alert and oriented to  person, place, and time.  Skin: Skin is warm and dry.  Psychiatric: She has a normal mood and affect. Her behavior is normal.    Ortho Exam healed vertical incision left neck.  Mild brachial plexus tenderness.  She has pain with attempted abduction more than 90 degrees active and passive.  Long of the biceps tender.  Reflexes are intact.  No interosseous atrophy. Specialty Comments:  No specialty comments available.  Imaging: Xr Cervical Spine 2 Or 3 Views  Result Date: 05/13/2017 AP lateral C-spine x-rays obtained and reviewed.  This shows 3 level cervical plate with fusion from C4-C7 with  good incorporation.  Some narrowing above the fusion as well as 2 mm anterolisthesis at C7-T1 below the fusion. Impression: Previous 3 level cervical fusion C4-C7 with slight disc degenerative changes above and below the fusion.  Xr Shoulder Left  Result Date: 05/13/2017 Three-view x-rays left shoulder obtained and reviewed.  This shows normal glenohumeral joint.  Mild acromioclavicular degenerative changes no soft tissue calcification negative for acute changes. Impression: Normal left shoulder x-rays  Xr Shoulder Right  Result Date: 05/13/2017 AP lateral right shoulder and Y scapular view obtained right shoulder.  This shows mild acromioclavicular degenerative changes.  No glenohumeral changes.  Glenohumeral joint is well located.  No soft tissue calcification. Impression: Normal right shoulder x-rays.  Mild acromioclavicular degenerative changes    PMFS History: Patient Active Problem List   Diagnosis Date Noted  . Osteoporosis 04/14/2017  . Diabetes mellitus with ophthalmic complication ([place]) 08/65/7846  . BMI 28.0-28.9,adult 01/04/2015  . CKD (chronic kidney disease) stage 2, GFR 60-89 ml/min 11/16/2013  . Vitamin D deficiency 06/04/2013  . Medication management 06/04/2013  . Type II diabetes mellitus with renal manifestations ([place])   . GERD (gastroesophageal reflux disease)   . Glaucoma   . Hyperlipidemia   . Hypertension   . COPD (chronic obstructive pulmonary disease) ([place])   . Asthma   . Cervical arthritis   . Lumbar spondylosis 03/27/2012  . Disorder of sacroiliac joint 05/31/2011   Past Medical History:  Diagnosis Date  . Asthma   . Bronchitis    hx of;>95yrs ago  . Chronic neck pain    arthritis  . COPD (chronic obstructive pulmonary disease) ([place])   . Degenerative disc disease    neck  . DeQuervain's disease (tenosynovitis)   . Diverticulitis   . Dizziness    related to neck issues  . Frozen shoulder September 2012  . GERD (gastroesophageal reflux disease)      takes Ranitidine nightly  . Glaucoma   . Hay fever   . Headache(784.0)    related neck issues  . High cholesterol    takes Lipitor daily  . History of shingles 2012  . Hypertension    takes Lotensin HCT daily  . Insomnia    d/t pain and takes Ativan nightly  . Osteoporosis   . Sarcoidosis >71yrs ago   no problems since  . Type II or unspecified type diabetes mellitus without mention of complication, not stated as uncontrolled    takes Metformin nightly    Family History  Problem Relation Age of Onset  . Cancer Mother 21       breast cancer  . Pneumonia Father   . Leukemia Father   . Glaucoma Father   . Anesthesia problems Neg Hx   . Hypotension Neg Hx   . Malignant hyperthermia Neg Hx   . Pseudochol deficiency Neg Hx   . Mitral valve prolapse  Sister   . Hypertension Sister   . HIV Son     Past Surgical History:  Procedure Laterality Date  . ABDOMINAL HYSTERECTOMY  1975  . ANTERIOR CERVICAL DECOMP/DISCECTOMY FUSION  07/31/2011   Procedure: ANTERIOR CERVICAL DECOMPRESSION/DISCECTOMY FUSION 3 LEVELS;  Surgeon: Hosie Spangle, MD;  Location: [facility];  Service: Neurosurgery;  Laterality: N/A;  Cervical four-five,Cervical five-six,Cervical six-seven anterior cervical decompression with fusion plating and bonegraft  . BREAST SURGERY  20+yrs ago   left breast;benign  . CARPAL TUNNEL RELEASE  2012  . COLONOSCOPY    . REFRACTIVE SURGERY  2011   bilateral  . TONSILLECTOMY  30+yrs ago  . WRIST SURGERY     ORIF left wrist and then removed d/t protruding screw in 2012   Social History   Occupational History  . Not on file  Tobacco Use  . Smoking status: Never Smoker  . Smokeless tobacco: Never Used  Substance and Sexual Activity  . Alcohol use: No  . Drug use: No  . Sexual activity: No    Birth control/protection: Surgical

## 2017-05-20 ENCOUNTER — Encounter (INDEPENDENT_AMBULATORY_CARE_PROVIDER_SITE_OTHER): Payer: Self-pay

## 2017-05-21 ENCOUNTER — Encounter: Payer: Self-pay | Admitting: Physical Therapy

## 2017-05-21 ENCOUNTER — Other Ambulatory Visit: Payer: Self-pay

## 2017-05-21 ENCOUNTER — Ambulatory Visit: Payer: Medicare HMO | Attending: Orthopaedic Surgery | Admitting: Physical Therapy

## 2017-05-21 DIAGNOSIS — M25511 Pain in right shoulder: Secondary | ICD-10-CM | POA: Diagnosis not present

## 2017-05-21 DIAGNOSIS — R293 Abnormal posture: Secondary | ICD-10-CM | POA: Diagnosis not present

## 2017-05-21 DIAGNOSIS — G8929 Other chronic pain: Secondary | ICD-10-CM | POA: Diagnosis not present

## 2017-05-21 DIAGNOSIS — M25611 Stiffness of right shoulder, not elsewhere classified: Secondary | ICD-10-CM | POA: Insufficient documentation

## 2017-05-21 DIAGNOSIS — M6281 Muscle weakness (generalized): Secondary | ICD-10-CM

## 2017-05-21 DIAGNOSIS — M25612 Stiffness of left shoulder, not elsewhere classified: Secondary | ICD-10-CM | POA: Diagnosis not present

## 2017-05-21 DIAGNOSIS — M25512 Pain in left shoulder: Secondary | ICD-10-CM | POA: Insufficient documentation

## 2017-05-21 NOTE — Therapy (Signed)
[facility] [place], [place], [address] Phone: [phone]   Fax:  [phone]  Physical Therapy Evaluation  Patient Details  Name: Sara [name] MRN: [id number] Date of Birth: [birth year] Referring Provider: Rodell Perna, MD   Encounter Date: 05/21/2017  PT End of Session - 05/21/17 0859    Visit Number  1    Number of Visits  13    Date for PT Re-Evaluation  07/04/17    Authorization Type  [employer] M[employer] KX at visit 15    PT Start Time  [phone] pt arrived late    PT Stop Time  0928    PT Time Calculation (min)  37 min    Activity Tolerance  Patient tolerated treatment well    Behavior During Therapy  [facility] for tasks assessed/performed       Past Medical History:  Diagnosis Date  . Asthma   . Bronchitis    hx of;>14yrs ago  . Chronic neck pain    arthritis  . COPD (chronic obstructive pulmonary disease) ([place])   . Degenerative disc disease    neck  . DeQuervain's disease (tenosynovitis)   . Diverticulitis   . Dizziness    related to neck issues  . Frozen shoulder September 2012  . GERD (gastroesophageal reflux disease)    takes Ranitidine nightly  . Glaucoma   . Hay fever   . Headache(784.0)    related neck issues  . High cholesterol    takes Lipitor daily  . History of shingles 2012  . Hypertension    takes Lotensin HCT daily  . Insomnia    d/t pain and takes Ativan nightly  . Osteoporosis   . Sarcoidosis >33yrs ago   no problems since  . Type II or unspecified type diabetes mellitus without mention of complication, not stated as uncontrolled    takes Metformin nightly    Past Surgical History:  Procedure Laterality Date  . ABDOMINAL HYSTERECTOMY  1975  . ANTERIOR CERVICAL DECOMP/DISCECTOMY FUSION  07/31/2011   Procedure: ANTERIOR CERVICAL DECOMPRESSION/DISCECTOMY FUSION 3 LEVELS;  Surgeon: Hosie Spangle, MD;  Location: [facility];  Service: Neurosurgery;  Laterality: N/A;  Cervical  four-five,Cervical five-six,Cervical six-seven anterior cervical decompression with fusion plating and bonegraft  . BREAST SURGERY  20+yrs ago   left breast;benign  . CARPAL TUNNEL RELEASE  2012  . COLONOSCOPY    . REFRACTIVE SURGERY  2011   bilateral  . TONSILLECTOMY  30+yrs ago  . WRIST SURGERY     ORIF left wrist and then removed d/t protruding screw in 2012    There were no vitals filed for this visit.   Subjective Assessment - 05/21/17 0902    Subjective  Fell in 2011 and broke both arms, was in bil casts and had significant pain in shoulders. Continued to have shoulder pain after having casts removed. ACDF in 2013 C4-7 which bumped shoulder pain down a little. Was worse in Rt arm to where she could not raise her arm. Was told she had arthritis so she treated with aspercream. About 6 mo ago began having Lt medial elbow pain and progressed to being unable to raise Lt arm.     Patient Stated Goals  baking/cooking, sleep, walking, driving, upper body strengthening, water aerobics, yard work, pick up cat    Currently in Pain?  Yes    Pain Score  6  took tramadol about 30 min ago    Pain Location  Shoulder  Pain Orientation  Left;Right Lt is worse than Rt    Pain Descriptors / Indicators  Aching;Sharp;Tingling;Pounding weak    Pain Radiating Towards  down arm to 4th & 5th digits, occasional neck pain, LBP    Aggravating Factors   activity    Pain Relieving Factors  tramadol, rest         [facility] PT Assessment - 05/21/17 0001      Assessment   Medical Diagnosis  bil shoulder pain    Referring Provider  Rodell Perna, MD    Onset Date/Surgical Date  -- 2011, Lt worsening in last 6 mo    Hand Dominance  Right    Prior Therapy  not this year      Precautions   Precautions  None      Restrictions   Weight Bearing Restrictions  No      Balance Screen   Has the patient fallen in the past 6 months  No      [place] residence    Additional  Comments  no stairs      Prior Function   Vocation  Retired      Associate Professor   Overall Cognitive Status  Within Functional Limits for tasks assessed      Observation/Other Assessments   Focus on Therapeutic Outcomes (FOTO)   58% limited      Sensation   Additional Comments  occasional tingling to 4th & 5th digits on Lt      Posture/Postural Control   Posture Comments  forward head, rounded shoulders      ROM / Strength   AROM / PROM / Strength  AROM;Strength      AROM   AROM Assessment Site  Shoulder    Right/Left Shoulder  Right;Left    Right Shoulder Flexion  135 Degrees    Right Shoulder ABduction  140 Degrees    Right Shoulder Internal Rotation  -- L3    Right Shoulder External Rotation  -- occiput    Left Shoulder Flexion  114 Degrees    Left Shoulder ABduction  92 Degrees    Left Shoulder Internal Rotation  -- to midline below sacrum    Left Shoulder External Rotation  -- occiput      Strength   Overall Strength Comments  Lt GHJ 3-/5- unable to move through avail ROM; Rt gross 4-/5    Strength Assessment Site  Shoulder    Right/Left Shoulder  --             Objective measurements completed on examination: See above findings.              PT Education - 05/21/17 0901    Education provided  Yes    Education Details  anatomy of condition, POC, HEP, exercise form/rationale, FOTO, importance of posture, racerback bra for support     Person(s) Educated  Patient    Methods  Explanation;Demonstration;Tactile cues;Verbal cues;Handout    Comprehension  Verbalized understanding;Need further instruction;Returned demonstration;Verbal cues required;Tactile cues required       PT Short Term Goals - 05/21/17 1027      PT SHORT TERM GOAL #1   Title  Lt GHJ flexion AROM to 120 for improved functional reach    Baseline  see flowsheet    Time  3    Period  Weeks    Status  New    Target Date  06/13/17  PT Long Term Goals - 05/21/17 1023      PT  LONG TERM GOAL #1   Title  Pt will be able to bake and cook without limitation by shoulder pain    Baseline  significant limitation noted at eval, severe pain after making banana bread    Time  6    Period  Weeks    Status  New    Target Date  07/04/17      PT LONG TERM GOAL #2   Title  Pt will be able to drive GHJ pain <=3/50    Baseline  10+/10 at eval with turning wheel    Time  6    Period  Weeks    Status  New    Target Date  07/04/17      PT LONG TERM GOAL #3   Title  Pt will be able to complete household chores and yardwork GHJ pain <=3/10    Baseline  severe and unable at eval    Time  6    Period  Weeks    Status  New    Target Date  07/04/17      PT LONG TERM GOAL #4   Title  FOTO to 44% limitation    Baseline  58% limited at eval    Time  6    Period  Weeks    Status  New    Target Date  07/04/17             Plan - 05/21/17 0934    Clinical Impression Statement  Pt presents to PT with complaints of bilateral shoulder pain, Lt worse than Rt. In the last 6 mo notes that she has had notable increase in difficulty moving and using Lt arm. Poor AROM with good available PROM, near [facility], but painful. Poor postural alignment at rest that is corrected with cuing. Pt will benefit from skilled PT in order to improve postural strength and alignment for improved shoulder strength, mobility and functional use.     History and Personal Factors relevant to plan of care:  h/o adhesive capsulitis, chronic shoulder pain, h/o cervical ACDF, COPD, DDD    Clinical Presentation  Evolving    Clinical Presentation due to:  recent worsening of symptoms limiting functional ability    Clinical Decision Making  Moderate    Rehab Potential  Good    PT Frequency  2x / week    PT Duration  6 weeks    PT Treatment/Interventions  ADLs/Self Care Home Management;Cryotherapy;Electrical Stimulation;Ultrasound;Moist Heat;Iontophoresis 4mg /ml Dexamethasone;Therapeutic activities;Therapeutic  exercise;Patient/family education;Manual techniques;Passive range of motion;Taping;Dry needling    PT Next Visit Plan  PROM GHJ, AAROM HEP, periscapular strengthening    PT Home Exercise Plan  scapular retraction/resting posture    Consulted and Agree with Plan of Care  Patient       Patient will benefit from skilled therapeutic intervention in order to improve the following deficits and impairments:  Pain, Improper body mechanics, Impaired sensation, Increased muscle spasms, Postural dysfunction, Impaired UE functional use, Decreased strength, Decreased range of motion, Decreased activity tolerance, Difficulty walking  Visit Diagnosis: Chronic left shoulder pain - Plan: PT plan of care cert/re-cert  Chronic right shoulder pain - Plan: PT plan of care cert/re-cert  Stiffness of left shoulder, not elsewhere classified - Plan: PT plan of care cert/re-cert  Stiffness of right shoulder, not elsewhere classified - Plan: PT plan of care cert/re-cert  Muscle weakness (generalized) - Plan: PT plan of  care cert/re-cert  Abnormal posture - Plan: PT plan of care cert/re-cert     Problem List Patient Active Problem List   Diagnosis Date Noted  . Osteoporosis 04/14/2017  . Diabetes mellitus with ophthalmic complication ([place]) 37/34/2876  . BMI 28.0-28.9,adult 01/04/2015  . CKD (chronic kidney disease) stage 2, GFR 60-89 ml/min 11/16/2013  . Vitamin D deficiency 06/04/2013  . Medication management 06/04/2013  . Type II diabetes mellitus with renal manifestations ([place])   . GERD (gastroesophageal reflux disease)   . Glaucoma   . Hyperlipidemia   . Hypertension   . COPD (chronic obstructive pulmonary disease) ([place])   . Asthma   . Cervical arthritis   . Lumbar spondylosis 03/27/2012  . Disorder of sacroiliac joint 05/31/2011    Nakita Santerre C. Mizani Dilday PT, DPT 05/21/17 10:31 AM   [facility] [address] [place], [place],  [address] Phone: [phone]   Fax:  [phone]  Name: DONNISHA BESECKER MRN: [id number] Date of Birth: 06/29/1945

## 2017-06-02 DIAGNOSIS — Z7984 Long term (current) use of oral hypoglycemic drugs: Secondary | ICD-10-CM | POA: Diagnosis not present

## 2017-06-02 DIAGNOSIS — H2513 Age-related nuclear cataract, bilateral: Secondary | ICD-10-CM | POA: Diagnosis not present

## 2017-06-02 DIAGNOSIS — E119 Type 2 diabetes mellitus without complications: Secondary | ICD-10-CM | POA: Diagnosis not present

## 2017-06-02 DIAGNOSIS — H401133 Primary open-angle glaucoma, bilateral, severe stage: Secondary | ICD-10-CM | POA: Diagnosis not present

## 2017-06-03 ENCOUNTER — Encounter (INDEPENDENT_AMBULATORY_CARE_PROVIDER_SITE_OTHER): Payer: Self-pay | Admitting: Orthopaedic Surgery

## 2017-06-03 ENCOUNTER — Ambulatory Visit (INDEPENDENT_AMBULATORY_CARE_PROVIDER_SITE_OTHER): Payer: Medicare HMO | Admitting: Orthopaedic Surgery

## 2017-06-03 VITALS — BP 122/75 | HR 79 | Ht 66.0 in | Wt 170.0 lb

## 2017-06-03 DIAGNOSIS — M7542 Impingement syndrome of left shoulder: Secondary | ICD-10-CM

## 2017-06-03 NOTE — Progress Notes (Signed)
Office Visit Note   Patient: Sara [name]           Date of Birth: 1946-01-07           MRN: [id number] Visit Date: 06/03/2017              Requested by: Unk Pinto, [address] [place], [place] [address] PCP: Unk Pinto, MD   Assessment & Plan: Visit Diagnoses:  1. Impingement syndrome of left shoulder     Plan: Patient's not responded to activity modification, anti-inflammatories, previous subacromial injection..  She is currently in therapy.  Injection was not helpful.  We will proceed with shoulder MRI scan to rule out rotator cuff tear due to her ongoing symptoms.  Office follow-up after scan for review.  She is had progressive symptoms for greater than 6 months and previous MRI scan 2014 showed supraspinatus tendinopathy without full-thickness tear.  Follow-Up Instructions: Return in about 3 weeks (around 06/24/2017).   Orders:  No orders of the defined types were placed in this encounter.  No orders of the defined types were placed in this encounter.     Procedures: No procedures performed   Clinical Data: No additional findings.   Subjective: Chief Complaint  Patient presents with  . Left Shoulder - Pain, Follow-up    HPI 72 year old female returns 3-week follow-up after left shoulder injection 05/13/2017 subacromially without relief.  She states she feels better if she does not move her shoulder.  She went to physical therapy for evaluation and they states that they did not have any appointments until later this week and she has 2 visits scheduled.  She is taken one half tramadol tablet with relief.  Pain bothers her at night and with outstretched reaching overhead activity.  He denies right shoulder problems.  Review of Systems 14 point review of systems updated and unchanged from 05/13/2017 office visit.  Previous 3 level cervical fusion.  Patient is a diabetic type 2.   Objective: Vital Signs: BP 122/75   Pulse 79   Ht 5'  6" (1.676 m)   Wt 170 lb (77.1 kg)   BMI 27.44 kg/m   Physical Exam  Constitutional: She is oriented to person, place, and time. She appears well-developed.  HENT:  Head: Normocephalic.  Right Ear: External ear normal.  Left Ear: External ear normal.  Eyes: Pupils are equal, round, and reactive to light.  Neck: No tracheal deviation present. No thyromegaly present.  Cardiovascular: Normal rate.  Pulmonary/Chest: Effort normal.  Abdominal: Soft.  Neurological: She is alert and oriented to person, place, and time.  Skin: Skin is warm and dry.  Psychiatric: She has a normal mood and affect. Her behavior is normal.    Ortho Exam well-healed cervical incision.  Minimal brachial plexus tenderness.  Positive impingement left shoulder.  Negative right shoulder.  Long head of the biceps is tender.  No distal biceps migration.  Elbow reaches full extension.  Upper extremity reflexes are 2+ and symmetrical.  Specialty Comments:  No specialty comments available.  Imaging: No results found.   PMFS History: Patient Active Problem List   Diagnosis Date Noted  . Osteoporosis 04/14/2017  . Diabetes mellitus with ophthalmic complication ([place]) 35/45/6256  . BMI 28.0-28.9,adult 01/04/2015  . CKD (chronic kidney disease) stage 2, GFR 60-89 ml/min 11/16/2013  . Vitamin D deficiency 06/04/2013  . Medication management 06/04/2013  . Type II diabetes mellitus with renal manifestations ([place])   . GERD (gastroesophageal reflux disease)   .  Glaucoma   . Hyperlipidemia   . Hypertension   . COPD (chronic obstructive pulmonary disease) ([place])   . Asthma   . Cervical arthritis   . Lumbar spondylosis 03/27/2012  . Disorder of sacroiliac joint 05/31/2011   Past Medical History:  Diagnosis Date  . Asthma   . Bronchitis    hx of;>63yrs ago  . Chronic neck pain    arthritis  . COPD (chronic obstructive pulmonary disease) ([place])   . Degenerative disc disease    neck  . DeQuervain's disease  (tenosynovitis)   . Diverticulitis   . Dizziness    related to neck issues  . Frozen shoulder September 2012  . GERD (gastroesophageal reflux disease)    takes Ranitidine nightly  . Glaucoma   . Hay fever   . Headache(784.0)    related neck issues  . High cholesterol    takes Lipitor daily  . History of shingles 2012  . Hypertension    takes Lotensin HCT daily  . Insomnia    d/t pain and takes Ativan nightly  . Osteoporosis   . Sarcoidosis >58yrs ago   no problems since  . Type II or unspecified type diabetes mellitus without mention of complication, not stated as uncontrolled    takes Metformin nightly    Family History  Problem Relation Age of Onset  . Cancer Mother 76       breast cancer  . Pneumonia Father   . Leukemia Father   . Glaucoma Father   . Anesthesia problems Neg Hx   . Hypotension Neg Hx   . Malignant hyperthermia Neg Hx   . Pseudochol deficiency Neg Hx   . Mitral valve prolapse Sister   . Hypertension Sister   . HIV Son     Past Surgical History:  Procedure Laterality Date  . ABDOMINAL HYSTERECTOMY  1975  . ANTERIOR CERVICAL DECOMP/DISCECTOMY FUSION  07/31/2011   Procedure: ANTERIOR CERVICAL DECOMPRESSION/DISCECTOMY FUSION 3 LEVELS;  Surgeon: Hosie Spangle, MD;  Location: [facility] [facility];  Service: Neurosurgery;  Laterality: N/A;  Cervical four-five,Cervical five-six,Cervical six-seven anterior cervical decompression with fusion plating and bonegraft  . BREAST SURGERY  20+yrs ago   left breast;benign  . CARPAL TUNNEL RELEASE  2012  . COLONOSCOPY    . REFRACTIVE SURGERY  2011   bilateral  . TONSILLECTOMY  30+yrs ago  . WRIST SURGERY     ORIF left wrist and then removed d/t protruding screw in 2012   Social History   Occupational History  . Not on file  Tobacco Use  . Smoking status: Never Smoker  . Smokeless tobacco: Never Used  Substance and Sexual Activity  . Alcohol use: No  . Drug use: No  . Sexual activity: Never    Birth  control/protection: Surgical

## 2017-06-04 ENCOUNTER — Encounter: Payer: Self-pay | Admitting: Physical Therapy

## 2017-06-04 ENCOUNTER — Ambulatory Visit: Payer: Medicare HMO | Admitting: Physical Therapy

## 2017-06-04 DIAGNOSIS — M25511 Pain in right shoulder: Secondary | ICD-10-CM

## 2017-06-04 DIAGNOSIS — M6281 Muscle weakness (generalized): Secondary | ICD-10-CM | POA: Diagnosis not present

## 2017-06-04 DIAGNOSIS — M25512 Pain in left shoulder: Secondary | ICD-10-CM | POA: Diagnosis not present

## 2017-06-04 DIAGNOSIS — R293 Abnormal posture: Secondary | ICD-10-CM | POA: Diagnosis not present

## 2017-06-04 DIAGNOSIS — G8929 Other chronic pain: Secondary | ICD-10-CM

## 2017-06-04 DIAGNOSIS — M25611 Stiffness of right shoulder, not elsewhere classified: Secondary | ICD-10-CM

## 2017-06-04 DIAGNOSIS — M25612 Stiffness of left shoulder, not elsewhere classified: Secondary | ICD-10-CM

## 2017-06-04 NOTE — Therapy (Signed)
[place], [place], [address] Phone: [phone]   Fax:  [phone]  Physical Therapy Treatment  Patient Details  Name: Sara [name] MRN: [id number] Date of Birth: Oct 13, 1945 Referring Provider: Rodell Perna, MD   Encounter Date: 06/04/2017  PT End of Session - 06/04/17 0844    Visit Number  2    Number of Visits  13    Date for PT Re-Evaluation  07/04/17    Authorization Type  [employer] MCR KX at visit 15    PT Start Time  0844    PT Stop Time  0925    PT Time Calculation (min)  41 min    Activity Tolerance  Patient tolerated treatment well    Behavior During Therapy  [facility] for tasks assessed/performed       Past Medical History:  Diagnosis Date  . Asthma   . Bronchitis    hx of;>35yrs ago  . Chronic neck pain    arthritis  . COPD (chronic obstructive pulmonary disease) ([place])   . Degenerative disc disease    neck  . DeQuervain's disease (tenosynovitis)   . Diverticulitis   . Dizziness    related to neck issues  . Frozen shoulder September 2012  . GERD (gastroesophageal reflux disease)    takes Ranitidine nightly  . Glaucoma   . Hay fever   . Headache(784.0)    related neck issues  . High cholesterol    takes Lipitor daily  . History of shingles 2012  . Hypertension    takes Lotensin HCT daily  . Insomnia    d/t pain and takes Ativan nightly  . Osteoporosis   . Sarcoidosis >18yrs ago   no problems since  . Type II or unspecified type diabetes mellitus without mention of complication, not stated as uncontrolled    takes Metformin nightly    Past Surgical History:  Procedure Laterality Date  . ABDOMINAL HYSTERECTOMY  1975  . ANTERIOR CERVICAL DECOMP/DISCECTOMY FUSION  07/31/2011   Procedure: ANTERIOR CERVICAL DECOMPRESSION/DISCECTOMY FUSION 3 LEVELS;  Surgeon: Hosie Spangle, MD;  Location: [place] [facility];  Service: Neurosurgery;  Laterality: N/A;  Cervical four-five,Cervical  five-six,Cervical six-seven anterior cervical decompression with fusion plating and bonegraft  . BREAST SURGERY  20+yrs ago   left breast;benign  . CARPAL TUNNEL RELEASE  2012  . COLONOSCOPY    . REFRACTIVE SURGERY  2011   bilateral  . TONSILLECTOMY  30+yrs ago  . WRIST SURGERY     ORIF left wrist and then removed d/t protruding screw in 2012    There were no vitals filed for this visit.  Subjective Assessment - 06/04/17 0844    Subjective  Feel a little better, I got a racer back bra which seems to be helpful. I have been doing the back exercises and they help. I have been doing a little more movement than I usually do since my sister is in the hospital.     Patient Stated Goals  baking/cooking, sleep, walking, driving, upper body strengthening, water aerobics, yard work, pick up cat    Currently in Pain?  Yes    Pain Score  5     Pain Location  Shoulder    Pain Orientation  Right;Left    Pain Descriptors / Indicators  Aching;Dull                      [facility] Adult PT Treatment/Exercise - 06/04/17 0001  Exercises   Exercises  Shoulder      Shoulder Exercises: Supine   Horizontal ABduction  Both;15 reps    Theraband Level (Shoulder Horizontal ABduction)  Level 2 (Red)    External Rotation  Both;15 reps;Theraband    Theraband Level (Shoulder External Rotation)  Level 2 (Red)    Flexion  AAROM;10 reps      Shoulder Exercises: Sidelying   External Rotation  Both;20 reps    ABduction  Both;20 reps      Shoulder Exercises: Pulleys   Flexion  3 minutes      Shoulder Exercises: Stretch   Other Shoulder Stretches  levator stretch      Manual Therapy   Manual Therapy  Passive ROM    Passive ROM  bil GHJ to tolerance; Lt-sided ulnar nerve glides               PT Short Term Goals - 05/21/17 1027      PT SHORT TERM GOAL #1   Title  Lt GHJ flexion AROM to 120 for improved functional reach    Baseline  see flowsheet    Time  3    Period  Weeks     Status  New    Target Date  06/13/17        PT Long Term Goals - 05/21/17 1023      PT LONG TERM GOAL #1   Title  Pt will be able to bake and cook without limitation by shoulder pain    Baseline  significant limitation noted at eval, severe pain after making banana bread    Time  6    Period  Weeks    Status  New    Target Date  07/04/17      PT LONG TERM GOAL #2   Title  Pt will be able to drive GHJ pain <=2/20    Baseline  10+/10 at eval with turning wheel    Time  6    Period  Weeks    Status  New    Target Date  07/04/17      PT LONG TERM GOAL #3   Title  Pt will be able to complete household chores and yardwork GHJ pain <=3/10    Baseline  severe and unable at eval    Time  6    Period  Weeks    Status  New    Target Date  07/04/17      PT LONG TERM GOAL #4   Title  FOTO to 44% limitation    Baseline  58% limited at eval    Time  6    Period  Weeks    Status  New    Target Date  07/04/17            Plan - 06/04/17 0925    Clinical Impression Statement  Added AAROM and strengthening to care today which pt tolerated well. Pt reported feeling really good after exercises.     PT Treatment/Interventions  ADLs/Self Care Home Management;Cryotherapy;Electrical Stimulation;Ultrasound;Moist Heat;Iontophoresis 4mg /ml Dexamethasone;Therapeutic activities;Therapeutic exercise;Patient/family education;Manual techniques;Passive range of motion;Taping;Dry needling    PT Next Visit Plan  Transition to AROM, review band exerciess    PT Home Exercise Plan  scapular retraction/resting posture; supine ER & horiz abd with band, wall slide, levator stretch    Consulted and Agree with Plan of Care  Patient       Patient will benefit from skilled therapeutic intervention in order to  improve the following deficits and impairments:  Pain, Improper body mechanics, Impaired sensation, Increased muscle spasms, Postural dysfunction, Impaired UE functional use, Decreased strength,  Decreased range of motion, Decreased activity tolerance, Difficulty walking  Visit Diagnosis: Chronic left shoulder pain  Chronic right shoulder pain  Stiffness of left shoulder, not elsewhere classified  Stiffness of right shoulder, not elsewhere classified  Muscle weakness (generalized)  Abnormal posture     Problem List Patient Active Problem List   Diagnosis Date Noted  . Osteoporosis 04/14/2017  . Diabetes mellitus with ophthalmic complication ([place]) 93/71/6967  . BMI 28.0-28.9,adult 01/04/2015  . CKD (chronic kidney disease) stage 2, GFR 60-89 ml/min 11/16/2013  . Vitamin D deficiency 06/04/2013  . Medication management 06/04/2013  . Type II diabetes mellitus with renal manifestations ([place])   . GERD (gastroesophageal reflux disease)   . Glaucoma   . Hyperlipidemia   . Hypertension   . COPD (chronic obstructive pulmonary disease) ([place])   . Asthma   . Cervical arthritis   . Lumbar spondylosis 03/27/2012  . Disorder of sacroiliac joint 05/31/2011    Doratha Mcswain C. Kristoffer Bala PT, DPT 06/04/17 9:28 AM   [facility] [address] [place], [place], [address] Phone: [phone]   Fax:  [phone]  Name: Sara [name] MRN: [id number] Date of Birth: 27-Jan-1946

## 2017-06-06 ENCOUNTER — Ambulatory Visit: Payer: Medicare HMO | Admitting: Physical Therapy

## 2017-06-06 ENCOUNTER — Encounter: Payer: Self-pay | Admitting: Physical Therapy

## 2017-06-06 DIAGNOSIS — M25611 Stiffness of right shoulder, not elsewhere classified: Secondary | ICD-10-CM

## 2017-06-06 DIAGNOSIS — G8929 Other chronic pain: Secondary | ICD-10-CM | POA: Diagnosis not present

## 2017-06-06 DIAGNOSIS — M25511 Pain in right shoulder: Secondary | ICD-10-CM

## 2017-06-06 DIAGNOSIS — M25612 Stiffness of left shoulder, not elsewhere classified: Secondary | ICD-10-CM

## 2017-06-06 DIAGNOSIS — R293 Abnormal posture: Secondary | ICD-10-CM | POA: Diagnosis not present

## 2017-06-06 DIAGNOSIS — M6281 Muscle weakness (generalized): Secondary | ICD-10-CM

## 2017-06-06 DIAGNOSIS — M25512 Pain in left shoulder: Secondary | ICD-10-CM | POA: Diagnosis not present

## 2017-06-06 NOTE — Therapy (Signed)
[place], [place], [address] Phone: [phone]   Fax:  [phone]  Physical Therapy Treatment  Patient Details  Name: Sara [name] MRN: [id number] Date of Birth: 1945/06/16 Referring Provider: Rodell Perna, MD   Encounter Date: 06/06/2017  PT End of Session - 06/06/17 0849    Visit Number  3    Number of Visits  13    Date for PT Re-Evaluation  07/04/17    Authorization Type  [employer] M[employer] KX at visit 15    PT Start Time  0848    PT Stop Time  0928    PT Time Calculation (min)  40 min    Activity Tolerance  Patient tolerated treatment well    Behavior During Therapy  [facility] for tasks assessed/performed       Past Medical History:  Diagnosis Date  . Asthma   . Bronchitis    hx of;>78yrs ago  . Chronic neck pain    arthritis  . COPD (chronic obstructive pulmonary disease) ([place])   . Degenerative disc disease    neck  . DeQuervain's disease (tenosynovitis)   . Diverticulitis   . Dizziness    related to neck issues  . Frozen shoulder September 2012  . GERD (gastroesophageal reflux disease)    takes Ranitidine nightly  . Glaucoma   . Hay fever   . Headache(784.0)    related neck issues  . High cholesterol    takes Lipitor daily  . History of shingles 2012  . Hypertension    takes Lotensin HCT daily  . Insomnia    d/t pain and takes Ativan nightly  . Osteoporosis   . Sarcoidosis >76yrs ago   no problems since  . Type II or unspecified type diabetes mellitus without mention of complication, not stated as uncontrolled    takes Metformin nightly    Past Surgical History:  Procedure Laterality Date  . ABDOMINAL HYSTERECTOMY  1975  . ANTERIOR CERVICAL DECOMP/DISCECTOMY FUSION  07/31/2011   Procedure: ANTERIOR CERVICAL DECOMPRESSION/DISCECTOMY FUSION 3 LEVELS;  Surgeon: Hosie Spangle, MD;  Location: [place];  Service: Neurosurgery;  Laterality: N/A;  Cervical four-five,Cervical  five-six,Cervical six-seven anterior cervical decompression with fusion plating and bonegraft  . BREAST SURGERY  20+yrs ago   left breast;benign  . CARPAL TUNNEL RELEASE  2012  . COLONOSCOPY    . REFRACTIVE SURGERY  2011   bilateral  . TONSILLECTOMY  30+yrs ago  . WRIST SURGERY     ORIF left wrist and then removed d/t protruding screw in 2012    There were no vitals filed for this visit.  Subjective Assessment - 06/06/17 0849    Subjective  I feel like I can cut back on a session, I am feeling really good. I have cut back on some driving. Would like to review use of gym equipment.     Patient Stated Goals  baking/cooking, sleep, walking, driving, upper body strengthening, water aerobics, yard work, pick up cat    Currently in Pain?  No/denies                      [facility] Adult PT Treatment/Exercise - 06/06/17 0001      Shoulder Exercises: Pulleys   Flexion  2 minutes      Shoulder Exercises: ROM/Strengthening   UBE (Upper Arm Bike)  2'/2' L1.5    Nustep  5 min L5 Ue & LE    Other ROM/Strengthening  Exercises  row & lat pull down machine      Shoulder Exercises: Stretch   Wall Stretch - Flexion  Other (comment) towel slide    Wall Stretch - ABduction  Other (comment) door pec stretch    Other Shoulder Stretches  upper trap & levator stretch             PT Education - 06/06/17 0930    Education provided  Yes    Education Details  use of gym equipment, cervical musculature activation/posture, post-therapy group program    Person(s) Educated  Patient    Methods  Explanation;Demonstration;Tactile cues;Verbal cues;Handout    Comprehension  Verbalized understanding;Returned demonstration;Verbal cues required;Tactile cues required;Need further instruction       PT Short Term Goals - 05/21/17 1027      PT SHORT TERM GOAL #1   Title  Lt GHJ flexion AROM to 120 for improved functional reach    Baseline  see flowsheet    Time  3    Period  Weeks    Status   New    Target Date  06/13/17        PT Long Term Goals - 05/21/17 1023      PT LONG TERM GOAL #1   Title  Pt will be able to bake and cook without limitation by shoulder pain    Baseline  significant limitation noted at eval, severe pain after making banana bread    Time  6    Period  Weeks    Status  New    Target Date  07/04/17      PT LONG TERM GOAL #2   Title  Pt will be able to drive GHJ pain <=4/17    Baseline  10+/10 at eval with turning wheel    Time  6    Period  Weeks    Status  New    Target Date  07/04/17      PT LONG TERM GOAL #3   Title  Pt will be able to complete household chores and yardwork GHJ pain <=3/10    Baseline  severe and unable at eval    Time  6    Period  Weeks    Status  New    Target Date  07/04/17      PT LONG TERM GOAL #4   Title  FOTO to 44% limitation    Baseline  58% limited at eval    Time  6    Period  Weeks    Status  New    Target Date  07/04/17            Plan - 06/06/17 4081    Clinical Impression Statement  Educated on use of gym equipment today which required heavy cuing but pt was able to demo and verbalize proper muscle use. Will educate on use of free weights at next visit.     PT Treatment/Interventions  ADLs/Self Care Home Management;Cryotherapy;Electrical Stimulation;Ultrasound;Moist Heat;Iontophoresis 4mg /ml Dexamethasone;Therapeutic activities;Therapeutic exercise;Patient/family education;Manual techniques;Passive range of motion;Taping;Dry needling    PT Next Visit Plan  free weights at gym    PT Home Exercise Plan  scapular retraction/resting posture; supine ER & horiz abd with band, wall slide, levator stretch, gym machines    Consulted and Agree with Plan of Care  Patient       Patient will benefit from skilled therapeutic intervention in order to improve the following deficits and impairments:  Pain, Improper body mechanics, Impaired sensation,  Increased muscle spasms, Postural dysfunction, Impaired UE  functional use, Decreased strength, Decreased range of motion, Decreased activity tolerance, Difficulty walking  Visit Diagnosis: Chronic left shoulder pain  Chronic right shoulder pain  Stiffness of left shoulder, not elsewhere classified  Stiffness of right shoulder, not elsewhere classified  Muscle weakness (generalized)  Abnormal posture     Problem List Patient Active Problem List   Diagnosis Date Noted  . Osteoporosis 04/14/2017  . Diabetes mellitus with ophthalmic complication ([place]) 73/56/7014  . BMI 28.0-28.9,adult 01/04/2015  . CKD (chronic kidney disease) stage 2, GFR 60-89 ml/min 11/16/2013  . Vitamin D deficiency 06/04/2013  . Medication management 06/04/2013  . Type II diabetes mellitus with renal manifestations ([place])   . GERD (gastroesophageal reflux disease)   . Glaucoma   . Hyperlipidemia   . Hypertension   . COPD (chronic obstructive pulmonary disease) ([place])   . Asthma   . Cervical arthritis   . Lumbar spondylosis 03/27/2012  . Disorder of sacroiliac joint 05/31/2011    Lan Mcneill C. Mahli Glahn PT, DPT 06/06/17 9:31 AM   [facility] [address] [place], [place], [address] Phone: [phone]   Fax:  [phone]  Name: KAREENA ARRAMBIDE MRN: [id number] Date of Birth: September 03, 1945

## 2017-06-09 ENCOUNTER — Encounter (INDEPENDENT_AMBULATORY_CARE_PROVIDER_SITE_OTHER): Payer: Self-pay | Admitting: Orthopaedic Surgery

## 2017-06-10 ENCOUNTER — Telehealth: Payer: Self-pay | Admitting: Physical Therapy

## 2017-06-10 ENCOUNTER — Ambulatory Visit: Payer: Medicare HMO | Admitting: Physical Therapy

## 2017-06-10 NOTE — Telephone Encounter (Signed)
Left voicemail regarding no-show to appointment today. Left next appointment time and asked her to call us if she needs to cancel or reschedule.

## 2017-06-12 ENCOUNTER — Ambulatory Visit: Payer: Medicare HMO | Admitting: Physical Therapy

## 2017-06-12 DIAGNOSIS — M25512 Pain in left shoulder: Principal | ICD-10-CM

## 2017-06-12 DIAGNOSIS — M25611 Stiffness of right shoulder, not elsewhere classified: Secondary | ICD-10-CM | POA: Diagnosis not present

## 2017-06-12 DIAGNOSIS — R293 Abnormal posture: Secondary | ICD-10-CM | POA: Diagnosis not present

## 2017-06-12 DIAGNOSIS — G8929 Other chronic pain: Secondary | ICD-10-CM | POA: Diagnosis not present

## 2017-06-12 DIAGNOSIS — M25612 Stiffness of left shoulder, not elsewhere classified: Secondary | ICD-10-CM | POA: Diagnosis not present

## 2017-06-12 DIAGNOSIS — M6281 Muscle weakness (generalized): Secondary | ICD-10-CM | POA: Diagnosis not present

## 2017-06-12 DIAGNOSIS — M25511 Pain in right shoulder: Secondary | ICD-10-CM | POA: Diagnosis not present

## 2017-06-12 NOTE — Therapy (Signed)
[place], [place], [address] Phone: [phone]   Fax:  [phone]  Physical Therapy Treatment  Patient Details  Name: Sara [name] MRN: [id number] Date of Birth: 03/27/1945 Referring Provider: Rodell Perna, MD   Encounter Date: 06/12/2017  PT End of Session - 06/12/17 0938    Visit Number  4    Number of Visits  13    Date for PT Re-Evaluation  07/04/17    Authorization Type  [employer] MCR KX at visit 15    PT Start Time  0935    PT Stop Time  1015    PT Time Calculation (min)  40 min    Activity Tolerance  Patient tolerated treatment well    Behavior During Therapy  [facility] for tasks assessed/performed       Past Medical History:  Diagnosis Date  . Asthma   . Bronchitis    hx of;>61yrs ago  . Chronic neck pain    arthritis  . COPD (chronic obstructive pulmonary disease) ([place])   . Degenerative disc disease    neck  . DeQuervain's disease (tenosynovitis)   . Diverticulitis   . Dizziness    related to neck issues  . Frozen shoulder September 2012  . GERD (gastroesophageal reflux disease)    takes Ranitidine nightly  . Glaucoma   . Hay fever   . Headache(784.0)    related neck issues  . High cholesterol    takes Lipitor daily  . History of shingles 2012  . Hypertension    takes Lotensin HCT daily  . Insomnia    d/t pain and takes Ativan nightly  . Osteoporosis   . Sarcoidosis >35yrs ago   no problems since  . Type II or unspecified type diabetes mellitus without mention of complication, not stated as uncontrolled    takes Metformin nightly    Past Surgical History:  Procedure Laterality Date  . ABDOMINAL HYSTERECTOMY  1975  . ANTERIOR CERVICAL DECOMP/DISCECTOMY FUSION  07/31/2011   Procedure: ANTERIOR CERVICAL DECOMPRESSION/DISCECTOMY FUSION 3 LEVELS;  Surgeon: Hosie Spangle, MD;  Location: [place] [facility];  Service: Neurosurgery;  Laterality: N/A;  Cervical four-five,Cervical  five-six,Cervical six-seven anterior cervical decompression with fusion plating and bonegraft  . BREAST SURGERY  20+yrs ago   left breast;benign  . CARPAL TUNNEL RELEASE  2012  . COLONOSCOPY    . REFRACTIVE SURGERY  2011   bilateral  . TONSILLECTOMY  30+yrs ago  . WRIST SURGERY     ORIF left wrist and then removed d/t protruding screw in 2012    There were no vitals filed for this visit.  Subjective Assessment - 06/12/17 0939    Subjective  Patient states she is doing much better. She is able to drive without pain now.    Patient Stated Goals  baking/cooking, sleep, walking, driving, upper body strengthening, water aerobics, yard work, pick up cat    Currently in Pain?  No/denies         [facility] PT Assessment - 06/12/17 0001      AROM   Left Shoulder Flexion  120 Degrees            No data recorded       [facility] Adult PT Treatment/Exercise - 06/12/17 0001      Shoulder Exercises: Seated   External Rotation  Strengthening;Both;20 reps      Shoulder Exercises: Sidelying   External Rotation  Strengthening;Left;20 reps;Weights 10 x no wt; 20 with  1#    External Rotation Weight (lbs)  1    Flexion  Strengthening;Left    ABduction  Strengthening;10 reps;Limitations    ABduction Limitations  painful by 8     Other Sidelying Exercises  empty can x 20      Shoulder Exercises: Standing   Flexion  Strengthening;Left;10 reps    Extension  10 reps    Diagonals  10 reps      Shoulder Exercises: Pulleys   Flexion  3 minutes      Shoulder Exercises: ROM/Strengthening   UBE (Upper Arm Bike)  2'/2' L1.5    Nustep  5 min L5 Ue & LE               PT Short Term Goals - 06/12/17 0947      PT SHORT TERM GOAL #1   Title  Lt GHJ flexion AROM to 120 for improved functional reach    Time  3    Period  Weeks    Status  Achieved        PT Long Term Goals - 06/12/17 0932      PT LONG TERM GOAL #1   Title  Pt will be able to bake and cook without limitation by  shoulder pain    Baseline  able to cook and bake yesterday without pain. Still fatigues at 15-20 min of use    Period  Weeks    Status  On-going      PT LONG TERM GOAL #2   Title  Pt will be able to drive GHJ pain <=3/55    Time  6    Period  Weeks    Status  Achieved      PT LONG TERM GOAL #3   Title  Pt will be able to complete household chores and yardwork GHJ pain <=3/10    Baseline  severe pain by 15 min     Period  Weeks    Status  On-going            Plan - 06/12/17 1253    Clinical Impression Statement  Patient did very well with TE today. She still experiences pain with some TE mainly against gravity. Advised patient to stay in pain free range with exercise at home and at gym.    Rehab Potential  Good    PT Frequency  2x / week    PT Duration  6 weeks    PT Treatment/Interventions  ADLs/Self Care Home Management;Cryotherapy;Electrical Stimulation;Ultrasound;Moist Heat;Iontophoresis 4mg /ml Dexamethasone;Therapeutic activities;Therapeutic exercise;Patient/family education;Manual techniques;Passive range of motion;Taping;Dry needling    PT Next Visit Plan  continue pain free strengthening and ROM    PT Home Exercise Plan  scapular retraction/resting posture; supine ER & horiz abd with band, wall slide, levator stretch, gym machines    Consulted and Agree with Plan of Care  Patient       Patient will benefit from skilled therapeutic intervention in order to improve the following deficits and impairments:  Pain, Improper body mechanics, Impaired sensation, Increased muscle spasms, Postural dysfunction, Impaired UE functional use, Decreased strength, Decreased range of motion, Decreased activity tolerance, Difficulty walking  Visit Diagnosis: Chronic left shoulder pain     Problem List Patient Active Problem List   Diagnosis Date Noted  . Osteoporosis 04/14/2017  . Diabetes mellitus with ophthalmic complication ([place]) 73/22/0254  . BMI 28.0-28.9,adult 01/04/2015  .  CKD (chronic kidney disease) stage 2, GFR 60-89 ml/min 11/16/2013  . Vitamin D deficiency 06/04/2013  .  Medication management 06/04/2013  . Type II diabetes mellitus with renal manifestations ([place])   . GERD (gastroesophageal reflux disease)   . Glaucoma   . Hyperlipidemia   . Hypertension   . COPD (chronic obstructive pulmonary disease) ([place])   . Asthma   . Cervical arthritis   . Lumbar spondylosis 03/27/2012  . Disorder of sacroiliac joint 05/31/2011    Madelyn Flavors PT 06/12/2017, 12:55 PM  [facility] [address] [place], [place], [address] Phone: [phone]   Fax:  [phone]  Name: Sara [name] MRN: [id number] Date of Birth: 1946-03-17

## 2017-06-17 ENCOUNTER — Encounter: Payer: Self-pay | Admitting: Physical Therapy

## 2017-06-17 ENCOUNTER — Ambulatory Visit: Payer: Medicare HMO | Attending: Orthopaedic Surgery | Admitting: Physical Therapy

## 2017-06-17 DIAGNOSIS — M25612 Stiffness of left shoulder, not elsewhere classified: Secondary | ICD-10-CM | POA: Insufficient documentation

## 2017-06-17 DIAGNOSIS — M25511 Pain in right shoulder: Secondary | ICD-10-CM | POA: Diagnosis not present

## 2017-06-17 DIAGNOSIS — M6281 Muscle weakness (generalized): Secondary | ICD-10-CM

## 2017-06-17 DIAGNOSIS — M25611 Stiffness of right shoulder, not elsewhere classified: Secondary | ICD-10-CM | POA: Diagnosis not present

## 2017-06-17 DIAGNOSIS — G8929 Other chronic pain: Secondary | ICD-10-CM | POA: Insufficient documentation

## 2017-06-17 DIAGNOSIS — M25512 Pain in left shoulder: Secondary | ICD-10-CM | POA: Insufficient documentation

## 2017-06-17 DIAGNOSIS — R293 Abnormal posture: Secondary | ICD-10-CM | POA: Diagnosis not present

## 2017-06-17 NOTE — Therapy (Addendum)
[place], [place], [address] Phone: [phone]   Fax:  [phone]  Physical Therapy Treatment/Discharge Summary  Patient Details  Name: VENETA SLITER MRN: [id number] Date of Birth: 1945-07-25 Referring Provider: Rodell Perna, MD   Encounter Date: 06/17/2017  PT End of Session - 06/17/17 0933    Visit Number  5    Number of Visits  13    Date for PT Re-Evaluation  07/04/17    Authorization Type  [employer] M[employer] KX at visit 15    PT Start Time  0930    PT Stop Time  1010    PT Time Calculation (min)  40 min       Past Medical History:  Diagnosis Date  . Asthma   . Bronchitis    hx of;>83yr ago  . Chronic neck pain    arthritis  . [place]   . Degenerative disc disease    neck  . DeQuervain's disease (tenosynovitis)   . Diverticulitis   . Dizziness    related to neck issues  . Frozen shoulder September 2012  . GERD (gastroesophageal reflux disease)    takes Ranitidine nightly  . Glaucoma   . Hay fever   . Headache(784.0)    related neck issues  . High cholesterol    takes Lipitor daily  . History of shingles 2012  . Hypertension    takes Lotensin HCT daily  . Insomnia    d/t pain and takes Ativan nightly  . Osteoporosis   . Sarcoidosis >350yrago   no problems since  . Type II or unspecified type diabetes mellitus without mention of complication, not stated as uncontrolled    takes Metformin nightly    Past Surgical History:  Procedure Laterality Date  . ABDOMINAL HYSTERECTOMY  1975  . ANTERIOR CERVICAL DECOMP/DISCECTOMY FUSION  07/31/2011   Procedure: ANTERIOR CERVICAL DECOMPRESSION/DISCECTOMY FUSION 3 LEVELS;  Surgeon: RoHosie SpangleMD;  [facility];  Service: Neurosurgery;  Laterality: N/A;  Cervical four-five,Cervical five-six,Cervical six-seven anterior cervical decompression with fusion plating and bonegraft  . BREAST  SURGERY  20+yrs ago   left breast;benign  . CARPAL TUNNEL RELEASE  2012  . COLONOSCOPY    . REFRACTIVE SURGERY  2011   bilateral  . TONSILLECTOMY  30+yrs ago  . WRIST SURGERY     ORIF left wrist and then removed d/t protruding screw in 2012    There were no vitals filed for this visit.  Subjective Assessment - 06/17/17 0932    Subjective  Just a little pain around my left elbow that leaves after I do my exercises.     Currently in Pain?  No/denies         [facility] 06/17[facility]17 0001      Observation/Other Assessments   Focus on Therapeutic Outcomes (FOTO)   27% limited improved from 58% limited                    [facility] Adult PT Treatment/Exercise - 06/17/17 0001      Shoulder Exercises: Seated   External Rotation  Strengthening;Both;20 reps red      Shoulder Exercises: Standing   Horizontal ABduction  10 reps    External Rotation  20 reps    Diagonals  10 reps      Shoulder Exercises: Pulleys   Flexion  3 minutes      Shoulder Exercises: ROM/Strengthening  UBE (Upper Arm Bike)  2'/2' L1.5    Nustep  5 min L5 Ue & LE    Other ROM/Strengthening Exercises  row & lat pull down machine      Shoulder Exercises: Stretch   Wall Stretch - Flexion  5 reps    Wall Stretch - ABduction  Other (comment) door pec stretch             PT Education - 06/17/17 1004    Education provided  Yes    Education Details  HEP    Person(s) Educated  Patient    Methods  Explanation;Handout    Comprehension  Verbalized understanding       PT Short Term Goals - 06/12/17 0947      PT SHORT TERM GOAL #1   Title  Lt GHJ flexion AROM to 120 for improved functional reach    Time  3    Period  Weeks    Status  Achieved        PT Long Term Goals - 06/17/17 0941      PT LONG TERM GOAL #1   Title  Pt will be able to bake and cook without limitation by shoulder pain    Baseline  not limited by shoulder pan    Time  6    Period  Weeks    Status  Achieved       PT LONG TERM GOAL #2   Title  Pt will be able to drive GHJ pain <=1/95    Baseline  no more pain with driving    Time  6    Period  Weeks    Status  Achieved      PT LONG TERM GOAL #3   Title  Pt will be able to complete household chores and yardwork GHJ pain <=3/10    Baseline  not limited by shoulder pain with household chores, unable to assess yardwork     Time  6    Period  Weeks    Status  Partially Met      PT LONG TERM GOAL #4   Title  FOTO to 44% limitation    Baseline  27 % limited             Plan - 06/17/17 0934    Clinical Impression Statement  Pt notes no pain with bringing in grocerries or driving which were her main concerns at evaluation. She was able to rearrange her kitchen without shoulder pain, only back fatigue. She reports she is 96% pain free since beginning PT. She is able to cook and clean up without pain. FOTO score much improved. We reviewed HEP and gym equipment. She is ready for discharge.     PT Next Visit Plan  discharge to HEP     PT Home Exercise Plan  scapular retraction/resting posture; supine ER & horiz abd with band, wall slide, levator stretch, gym machines, red band diagonals, doorway stretch    Consulted and Agree with Plan of Care  Patient       Patient will benefit from skilled therapeutic intervention in order to improve the following deficits and impairments:  Pain, Improper body mechanics, Impaired sensation, Increased muscle spasms, Postural dysfunction, Impaired UE functional use, Decreased strength, Decreased range of motion, Decreased activity tolerance, Difficulty walking  Visit Diagnosis: Chronic left shoulder pain  Chronic right shoulder pain  Stiffness of left shoulder, not elsewhere classified  Stiffness of right shoulder, not elsewhere classified  Muscle weakness (  generalized)  Abnormal posture     Problem List Patient Active Problem List   Diagnosis Date Noted  . Osteoporosis 04/14/2017  . Diabetes  mellitus with ophthalmic complication ([place]) 70/01/33  . BMI 28.0-28.9,adult 01/04/2015  . CKD (chronic kidney disease) stage 2, GFR 60-89 ml/min 11/16/2013  . Vitamin D deficiency 06/04/2013  . Medication management 06/04/2013  . Type II diabetes mellitus with renal manifestations ([place])   . GERD (gastroesophageal reflux disease)   . Glaucoma   . Hyperlipidemia   . Hypertension   . COPD (chronic obstructive pulmonary disease) ([place])   . Asthma   . Cervical arthritis   . Lumbar spondylosis 03/27/2012  . Disorder of sacroiliac joint 05/31/2011    Dorene Ar, PTA 06/17/2017, 10:08 AM  [facility] [address] [place], [place], [address] Phone: [phone]   Fax:  [phone]  Name: TOYA PALACIOS MRN: [id number] Date of Birth: 12-21-1945  PHYSICAL THERAPY DISCHARGE SUMMARY  Visits from Start of Care: 5  Current functional level related to goals / functional outcomes: See above   Remaining deficits: See above   Education / Equipment: Anatomy of condition, POC, HEP, exercise form/rationale  Plan: Patient agrees to discharge.  Patient goals were partially met. Patient is being discharged due to being pleased with the current functional level.  ?????     Alexandrea Westergard C. Hightower PT, DPT 10/07/17 11:23 AM

## 2017-06-19 ENCOUNTER — Encounter: Payer: Self-pay | Admitting: Physical Therapy

## 2017-06-24 ENCOUNTER — Encounter: Payer: Self-pay | Admitting: Physical Therapy

## 2017-06-25 ENCOUNTER — Ambulatory Visit (INDEPENDENT_AMBULATORY_CARE_PROVIDER_SITE_OTHER): Payer: Medicare HMO | Admitting: Orthopaedic Surgery

## 2017-06-26 ENCOUNTER — Encounter: Payer: Self-pay | Admitting: Physical Therapy

## 2017-07-01 ENCOUNTER — Encounter: Payer: Self-pay | Admitting: Physical Therapy

## 2017-07-03 ENCOUNTER — Encounter: Payer: Self-pay | Admitting: Physical Therapy

## 2017-07-09 ENCOUNTER — Ambulatory Visit (INDEPENDENT_AMBULATORY_CARE_PROVIDER_SITE_OTHER): Payer: Medicare HMO | Admitting: Orthopaedic Surgery

## 2017-07-09 DIAGNOSIS — Z981 Arthrodesis status: Secondary | ICD-10-CM

## 2017-07-16 ENCOUNTER — Other Ambulatory Visit: Payer: Self-pay | Admitting: Internal Medicine

## 2017-07-22 ENCOUNTER — Encounter (INDEPENDENT_AMBULATORY_CARE_PROVIDER_SITE_OTHER): Payer: Self-pay | Admitting: Orthopaedic Surgery

## 2017-07-22 DIAGNOSIS — Z981 Arthrodesis status: Secondary | ICD-10-CM | POA: Insufficient documentation

## 2017-07-22 NOTE — Progress Notes (Signed)
Office Visit Note   Patient: Sara [name]           Date of Birth: 1945-08-16           MRN: [id number] Visit Date: 07/09/2017              Requested by: Unk Pinto, [place], [place] [address] PCP: Unk Pinto, MD   Assessment & Plan: Visit Diagnoses:  1. S/P cervical spinal fusion     Plan: Patient is responded to physical therapy and [facility] is virtually [facility]-free.  She will continue regular activities and she can return if she has increased symptoms.  She has a solid 3 level cervical fusion with mild narrowing above her fusion at the C3-4 level by MRI 2017.  She can return if she has increased symptoms.  Follow-Up Instructions: Return if symptoms worsen or fail to improve.   Orders:  No orders of the defined types were placed in this encounter.  No orders of the defined types were placed in this encounter.     Procedures: No procedures performed   Clinical Data: No additional findings.   Subjective: Chief Complaint  Patient presents with  . Right Shoulder - Follow-up  . Left Shoulder - Follow-up    HPI 72 year old female returns with ongoing problems with bilateral shoulder pain and stiffness.  She has more symptoms on the left than right.  She discontinued physical therapy, joined the [employer] states she is virtually pain-free.  Was a little lightheaded today skipped her blood pressure medication he is 88/56 informed.  Blood pressure at home for was 94/59.  Review of Systems 14 point review of systems  updated and unchanged from 2/26/ 2019 other than as mentioned above.  She is had previous 3 level cervical fusion.  She is a type II diabetic.   Objective: Vital Signs: BP (!) 85/52 (BP Location: Left Arm, Patient Position: Sitting, Cuff Size: Normal) Comment: checked right arm manually (stting, reg cuff)  88/56  Pulse 72   Ht 5\' 6"  (1.676 m)   Wt 165 lb 12.8 oz  (75.2 kg)   BMI 26.76 kg/m   Physical Exam  Constitutional: She is oriented to person, place, and time. She appears well-developed.  HENT:  Head: Normocephalic.  Right Ear: External ear normal.  Left Ear: External ear normal.  Eyes: Pupils are equal, round, and reactive to light.  Neck: No tracheal deviation present. No thyromegaly present.  Cardiovascular: Normal rate.  Pulmonary/Chest: Effort normal.  Abdominal: Soft.  Neurological: She is alert and oriented to person, place, and time.  Skin: Skin is warm and dry.  Psychiatric: She has a normal mood and affect. Her behavior is normal.    Ortho Exam well-healed cervical incision.  Good rotation of her cervical spine.  Upper extremity reflexes are 2+.  No isolated motor weakness.  Negative impingement left shoulder today.  Specialty Comments:  No specialty comments available.  Imaging: No results found.   PMFS History: Patient Active Problem List   Diagnosis Date Noted  . S/P cervical spinal fusion 07/22/2017  . Osteoporosis 04/14/2017  . Diabetes mellitus with ophthalmic complication ([place]) 98/33/8250  . BMI 28.0-28.9,adult 01/04/2015  . CKD (chronic kidney disease) stage 2, GFR 60-89 ml/min 11/16/2013  . Vitamin D deficiency 06/04/2013  . Medication management 06/04/2013  . Type II diabetes mellitus with renal manifestations ([place])   . GERD (gastroesophageal reflux disease)   .  Glaucoma   . Hyperlipidemia   . Hypertension   . COPD (chronic obstructive pulmonary disease) ([place])   . Asthma   . Cervical arthritis   . Lumbar spondylosis 03/27/2012  . Disorder of sacroiliac joint 05/31/2011   Past Medical History:  Diagnosis Date  . Asthma   . Bronchitis    hx of;>44yrs ago  . Chronic neck pain    arthritis  . COPD (chronic obstructive pulmonary disease) ([place])   . Degenerative disc disease    neck  . DeQuervain's disease (tenosynovitis)   . Diverticulitis   . Dizziness    related to neck issues  . Frozen  shoulder September 2012  . GERD (gastroesophageal reflux disease)    takes Ranitidine nightly  . Glaucoma   . Hay fever   . Headache(784.0)    related neck issues  . High cholesterol    takes Lipitor daily  . History of shingles 2012  . Hypertension    takes Lotensin HCT daily  . Insomnia    d/t pain and takes Ativan nightly  . Osteoporosis   . Sarcoidosis >44yrs ago   no problems since  . Type II or unspecified type diabetes mellitus without mention of complication, not stated as uncontrolled    takes Metformin nightly    Family History  Problem Relation Age of Onset  . Cancer Mother 40       breast cancer  . Pneumonia Father   . Leukemia Father   . Glaucoma Father   . Anesthesia problems Neg Hx   . Hypotension Neg Hx   . Malignant hyperthermia Neg Hx   . Pseudochol deficiency Neg Hx   . Mitral valve prolapse Sister   . Hypertension Sister   . HIV Son     Past Surgical History:  Procedure Laterality Date  . ABDOMINAL HYSTERECTOMY  1975  . ANTERIOR CERVICAL DECOMP/DISCECTOMY FUSION  07/31/2011   Procedure: ANTERIOR CERVICAL DECOMPRESSION/DISCECTOMY FUSION 3 LEVELS;  Surgeon: Hosie Spangle, MD;  Location: [facility];  Service: Neurosurgery;  Laterality: N/A;  Cervical four-five,Cervical five-six,Cervical six-seven anterior cervical decompression with fusion plating and bonegraft  . BREAST SURGERY  20+yrs ago   left breast;benign  . CARPAL TUNNEL RELEASE  2012  . COLONOSCOPY    . REFRACTIVE SURGERY  2011   bilateral  . TONSILLECTOMY  30+yrs ago  . WRIST SURGERY     ORIF left wrist and then removed d/t protruding screw in 2012   Social History   Occupational History  . Not on file  Tobacco Use  . Smoking status: Never Smoker  . Smokeless tobacco: Never Used  Substance and Sexual Activity  . Alcohol use: No  . Drug use: No  . Sexual activity: Never    Birth control/protection: Surgical

## 2017-07-23 ENCOUNTER — Encounter: Payer: Self-pay | Admitting: Adult Health

## 2017-07-23 DIAGNOSIS — F419 Anxiety disorder, unspecified: Secondary | ICD-10-CM

## 2017-07-23 DIAGNOSIS — G4700 Insomnia, unspecified: Secondary | ICD-10-CM | POA: Insufficient documentation

## 2017-07-23 HISTORY — DX: Anxiety disorder, unspecified: F41.9

## 2017-07-23 NOTE — Progress Notes (Signed)
MEDICARE ANNUAL WELLNESS VISIT AND FOLLOW UP  Assessment:   Diagnoses and all orders for this visit:  Encounter for Medicare annual wellness exam  Essential hypertension Continue medications Monitor blood pressure at home; call if consistently over 130/80 Continue DASH diet.   Reminder to go to the ER if any CP, SOB, nausea, dizziness, severe HA, changes vision/speech, left arm numbness and tingling and jaw pain.  Chronic obstructive pulmonary disease, unspecified COPD type ([place]) Well controlled with night time humidifier use Has inhaler to use as needed; hasn't used in a long time  Uncomplicated asthma, unspecified asthma severity, unspecified whether persistent Well controlled without recent exacerbation on PRN albuterol, humidifier at night Avoid triggers  Gastroesophageal reflux disease, esophagitis presence not specified Well managed on current medications Discussed diet, avoiding triggers and other lifestyle changes  Type 2 diabetes mellitus with diabetic nephropathy, without long-term current use of insulin [facility]) Education: Reviewed 'ABCs' of diabetes management (respective goals in parentheses):  A1C (<7), blood pressure (<130/80), and cholesterol (LDL <70) Eye Exam yearly and Dental Exam every 6 months. Dietary recommendations Physical Activity recommendations Foot exam done  Osteoporosis, unspecified osteoporosis type, unspecified pathological fracture presence Has been on fosamax x 4 years; stopped 2018; order dexa for follow up and restart fosamax in 2020 Continue calcium/vitamin D supplementation, weight bearing exercises  Lumbar spondylosis Doing exercises, tylenol Followed by ortho as needed  Cervical arthritis S/p fusion, managing well with therapy, sleeping with specialty pillow and using tylenol Followed by ortho as needed  CKD (chronic kidney disease) stage 2, GFR 60-89 ml/min Increase fluids, avoid NSAIDS, monitor sugars, will monitor  Vitamin D  deficiency At goal at recent check; continue to recommend supplementation for goal of 70-100 Defer vitamin D level  Medication management CBC, CMP/GFR  Hyperlipidemia Currently well above goal of LDL <70; atorvastatin was increased to 40 mg daily Continue low cholesterol diet and exercise.  Check lipid panel.   Glaucoma, unspecified glaucoma type, unspecified laterality Continue eye drops, follow up with ophthalmology  Insomnia Insomnia- good sleep hygiene discussed, increase day time activity, try melatonin or benadryl to limit daily use of lorazepam      Over 40 minutes of exam, counseling, chart review and critical decision making was performed Future Appointments  Date Time Provider [place]  01/12/2018 10:00 AM Vicie Mutters, PA-C [facility] None     Plan:   During the course of the visit the patient was educated and counseled about appropriate screening and preventive services including:    Pneumococcal vaccine   Prevnar 13  Influenza vaccine  Td vaccine  Screening electrocardiogram  Bone densitometry screening  Colorectal cancer screening  Diabetes screening  Glaucoma screening  Nutrition counseling   Advanced directives: requested   Subjective:  Sara [name] is a 72 y.o. female who presents for Medicare Annual Wellness Visit and 3 month follow up. She follows with Dr. Patrice Paradise for neck/back pains.   she has a diagnosis of anxiety and is currently on ativan 0.5-1 mg PRN, reports symptoms are well controlled on current regimen. she reports she currently takes 0.5 mg at night to help with sleep and is doing well.   BMI is Body mass index is 26.79 kg/m., she has been working on diet and exercise. Wt Readings from Last 3 Encounters:  07/24/17 166 lb (75.3 kg)  07/09/17 165 lb 12.8 oz (75.2 kg)  06/03/17 170 lb (77.1 kg)    Her blood pressure has been controlled at home, today their BP is  BP: 110/74 She does workout. She denies chest  pain, shortness of breath, dizziness.   She is on cholesterol medication (atorvastatin 40 mg daily) and denies myalgias. Her cholesterol is not at goal. The cholesterol last visit was:   Lab Results  Component Value Date   CHOL 204 (H) 04/11/2017   HDL 61 04/11/2017   LDLCALC 124 (H) 04/11/2017   TRIG 88 04/11/2017   CHOLHDL 3.3 04/11/2017    She has been working on diet and exercise for new T2 diabetes, and denies foot ulcerations, increased appetite, nausea, paresthesia of the feet, polydipsia, polyuria, visual disturbances, vomiting and weight loss. She does check fasting values and reports runs about 110. Last A1C in the office was:  Lab Results  Component Value Date   HGBA1C 6.6 (H) 04/11/2017   Last GFR: Lab Results  Component Value Date   GFRAA 71 04/11/2017   Patient is on Vitamin D supplement and at goal at recent check:   Lab Results  Component Value Date   VD25OH 64 04/11/2017      Medication Review: Current Outpatient Medications on File Prior to Visit  Medication Sig Dispense Refill  . ACCU-CHEK SOFTCLIX LANCETS lancets CHECK BLOOD GLUCOSE 1 TIME EVERY DAY. Dx-R73.03 100 each 1  . acetaminophen (TYLENOL ARTHRITIS PAIN) 650 MG CR tablet Take 1,300 mg by mouth every 8 (eight) hours as needed. For pain    . albuterol (PROVENTIL HFA;VENTOLIN HFA) 108 (90 Base) MCG/ACT inhaler Inhale 2 puffs into the lungs every 6 (six) hours as needed for wheezing or shortness of breath (cough). 1 Inhaler 0  . Ascorbic Acid (VITAMIN C PO) Take 1,000 mg by mouth daily.     Marland Kitchen aspirin EC 81 MG tablet Take 81 mg by mouth daily.    Marland Kitchen atorvastatin (LIPITOR) 40 MG tablet TAKE 1 TABLET EVERY DAY 90 tablet 1  . baclofen (LIORESAL) 10 MG tablet TAKE 1/2 TO 1 TABLET BY MOUTH 2 TO 3 TIMES DAILY AS NEEDED FOR MUSCLE SPASM 270 tablet 0  . benazepril-hydrochlorthiazide (LOTENSIN HCT) 20-25 MG tablet TAKE 1 TABLET EVERY DAY 90 tablet 1  . betaxolol (BETOPTIC-S) 0.25 % ophthalmic suspension Place 1 drop  into both eyes 2 (two) times daily.    . Blood Glucose Monitoring Suppl (ACCU-CHEK AVIVA PLUS) w/Device KIT USE AS DIRECTED  TO CHECK BLOOD SUGAR ONE TIME DAILY 1 kit 0  . brinzolamide (AZOPT) 1 % ophthalmic suspension Place 1 drop into both eyes 2 (two) times daily.    . Brinzolamide-Brimonidine (SIMBRINZA) 1-0.2 % SUSP Apply to eye. 2 drops each daily    . CALCIUM PO Take 500 mg by mouth daily.     . fluticasone (FLONASE) 50 MCG/ACT nasal spray SHAKE LIQUID AND USE 2 SPRAYS IN EACH NOSTRIL DAILY 48 g 1  . gabapentin (NEURONTIN) 600 MG tablet Take 1/2 to 1 tablets 2 to 3 x / day for Pain 270 tablet 1  . glucose blood (ACCU-CHEK AVIVA PLUS) test strip Check blood glucose 1 time daily-DX-R73.03 100 each 1  . LORazepam (ATIVAN) 1 MG tablet Take 1/2 to 1 tablet  1 to 2  x / day ONLY if needed for Anxiety Attack  & please try to limit to 5 days /week to avoid addiction 60 tablet 0  . metFORMIN (GLUCOPHAGE-XR) 500 MG 24 hr tablet Take 1 to 2 tablets 2 x / day with a meal for Diabetes 360 tablet 1  . Multiple Vitamin (MULTIVITAMIN WITH MINERALS) TABS tablet Take 1 tablet  by mouth daily.    . promethazine-dextromethorphan (PROMETHAZINE-DM) 6.25-15 MG/5ML syrup Take 5 mLs by mouth 4 (four) times daily as needed for cough. 240 mL 0  . ranitidine (ZANTAC) 300 MG tablet TAKE 1 TABLET AT BEDTIME 90 tablet 4  . timolol (BETIMOL) 0.5 % ophthalmic solution Place 1 drop into both eyes 2 (two) times daily.    . travoprost, benzalkonium, (TRAVATAN) 0.004 % ophthalmic solution 1 drop at bedtime.    Marland Kitchen alendronate (FOSAMAX) 70 MG tablet TAKE 1 TABLET ONCE A WEEK. TAKE WITH A FULL GLASS OF WATER ON AN EMPTY STOMACH. (Patient not taking: Reported on 07/24/2017) 12 tablet 3  . COD LIVER OIL PO Take by mouth daily.    . VOLTAREN 1 % GEL APPLY  2  TO  4  GRAMS TOPICALLY FOUR TIMES DAILY (Patient not taking: Reported on 07/24/2017) 100 g 0   No current facility-administered medications on file prior to visit.     Allergies   Allergen Reactions  . Penicillins Shortness Of Breath    And hives  . Aspirin Other (See Comments)    Stomach upset, but low dose is ok  . Hydromorphone Nausea Only    Other reaction(s): Sweating (intolerance)  . Sulfa Antibiotics Other (See Comments)    unknown  . Codeine Rash    Current Problems (verified) Patient Active Problem List   Diagnosis Date Noted  . Anxiety 07/23/2017  . S/P cervical spinal fusion 07/22/2017  . Osteoporosis 04/14/2017  . Overweight (BMI 25.0-29.9) 01/04/2015  . CKD stage 2 due to type 2 diabetes mellitus ([place]) 11/16/2013  . Vitamin D deficiency 06/04/2013  . Medication management 06/04/2013  . Type II diabetes mellitus with renal manifestations ([place])   . GERD (gastroesophageal reflux disease)   . Glaucoma   . Hyperlipidemia   . Hypertension   . COPD (chronic obstructive pulmonary disease) ([place])   . Asthma   . Cervical arthritis   . Lumbar spondylosis 03/27/2012  . Disorder of sacroiliac joint 05/31/2011    Screening Tests Immunization History  Administered Date(s) Administered  . Influenza, High Dose Seasonal PF 01/04/2015, 12/31/2016  . Influenza-Unspecified 03/01/2016  . Pneumococcal Conjugate-13 11/16/2013  . Pneumococcal Polysaccharide-23 04/12/2015  . Pneumococcal-Unspecified 03/18/2005  . Tdap 03/18/2009  . Zoster 11/13/2012   Preventative care: Last colonoscopy: 2009 - has gotten letter, will schedule MGM 01/2017  Dexa 01/2017 -has been on fosamax 2014~2018 MRI neck 2017 CXR 2015 CT AB 2011 diverticulitis  Prior vaccinations: TD or Tdap: 2011  Influenza: 2018 Pneumococcal: 2017 Prevnar13: 2015 Shingles/Zostavax: 2014  Names of Other Physician/Practitioners you currently use: 1. [facility] here for primary care 2. [facility] Dr. Lenna Gilford,  last visit 01/07/2017 - abstracted 3. Dr. Stan Head at [facility] for glaucoma.  4. [facility] Pharmacist, community, last visit 2019  Patient Care  Team: Unk Pinto, MD as PCP - General (Internal Medicine) Normajean Glasgow, MD as Attending Physician (Physical Medicine and Rehabilitation) Phylliss Bob, MD as Consulting Physician (Orthopedic Surgery) Juanita Craver, MD as Consulting Physician (Gastroenterology)  SURGICAL HISTORY She  has a past surgical history that includes Carpal tunnel release (2012); Wrist surgery; Breast surgery (20+yrs ago); Tonsillectomy (30+yrs ago); Abdominal hysterectomy (1975); Refractive surgery (2011); Colonoscopy; and Anterior cervical decomp/discectomy fusion (07/31/2011). FAMILY HISTORY Her family history includes Cancer (age of onset: 2) in her mother; Glaucoma in her father; HIV in her son; Hypertension in her sister; Leukemia in her father; Mitral valve prolapse in her sister; Pneumonia  in her father. SOCIAL HISTORY She  reports that she has never smoked. She has never used smokeless tobacco. She reports that she does not drink alcohol or use drugs.   MEDICARE WELLNESS OBJECTIVES: Physical activity:   Cardiac risk factors:   Depression/mood screen:   Depression screen [facility] 2/9 07/24/2017  Decreased Interest 0  Down, Depressed, Hopeless 0  PHQ - 2 Score 0    ADLs:  In your present state of health, do you have any difficulty performing the following activities: 04/11/2017  Hearing? N  Vision? N  Difficulty concentrating or making decisions? N  Walking or climbing stairs? N  Dressing or bathing? N  Doing errands, shopping? N  Some recent data might be hidden     Cognitive Testing  Alert? Yes  Normal Appearance?Yes  Oriented to person? Yes  Place? Yes   Time? Yes  Recall of three objects?  Yes  Can perform simple calculations? Yes  Displays appropriate judgment?Yes  Can read the correct time from a watch face?Yes  EOL planning: Does Patient Have a Medical Advance Directive?: Yes Type of Advance Directive: Healthcare Power of Attorney, Living will Does patient want to make changes to medical  advance directive?: No - Patient declined Copy of [place] in Chart?: No - copy requested  Review of Systems  Constitutional: Negative for malaise/fatigue and weight loss.  HENT: Negative for hearing loss and tinnitus.   Eyes: Negative for blurred vision and double vision.  Respiratory: Negative for cough, sputum production, shortness of breath and wheezing.   Cardiovascular: Negative for chest pain, palpitations, orthopnea, claudication, leg swelling and PND.  Gastrointestinal: Negative for abdominal pain, blood in stool, constipation, diarrhea, heartburn, melena, nausea and vomiting.  Genitourinary: Negative.   Musculoskeletal: Negative for falls, joint pain and myalgias.  Skin: Negative for rash.  Neurological: Negative for dizziness, tingling, sensory change, weakness and headaches.  Endo/Heme/Allergies: Negative for polydipsia.  Psychiatric/Behavioral: Negative.  Negative for depression, memory loss, substance abuse and suicidal ideas. The patient is not nervous/anxious and does not have insomnia.   All other systems reviewed and are negative.    Objective:     Today's Vitals   07/24/17 0953  BP: 110/74  Pulse: 68  Temp: (!) 97 F (36.1 C)  SpO2: 98%  Weight: 166 lb (75.3 kg)  Height: 5' 6"  (1.676 m)  PainSc: 3   PainLoc: Back   Body mass index is 26.79 kg/m.  General appearance: alert, no distress, WD/WN, female HEENT: normocephalic, sclerae anicteric, TMs pearly, nares patent, no discharge or erythema, pharynx normal Oral cavity: MMM, no lesions Neck: supple, no lymphadenopathy, no thyromegaly, no masses Heart: RRR, normal S1, S2, no murmurs Lungs: CTA bilaterally, no wheezes, rhonchi, or rales Abdomen: +bs, soft, non tender, non distended, no masses, no hepatomegaly, no splenomegaly Musculoskeletal: nontender, no swelling, no obvious deformity Extremities: no edema, no cyanosis, no clubbing Pulses: 2+ symmetric, upper and lower extremities,  normal cap refill Neurological: alert, oriented x 3, CN2-12 intact, strength normal upper extremities and lower extremities, sensation normal throughout, DTRs 2+ throughout, no cerebellar signs, gait normal Psychiatric: normal affect, behavior normal, pleasant   Medicare Attestation I have personally reviewed: The patient's medical and social history Their use of alcohol, tobacco or illicit drugs Their current medications and supplements The patient's functional ability including ADLs,fall risks, home safety risks, cognitive, and hearing and visual impairment Diet and physical activities Evidence for depression or mood disorders  The patient's weight, height, BMI, and visual  acuity have been recorded in the chart.  I have made referrals, counseling, and provided education to the patient based on review of the above and I have provided the patient with a written personalized care plan for preventive services.     Izora Ribas, NP   07/24/2017

## 2017-07-24 ENCOUNTER — Ambulatory Visit (INDEPENDENT_AMBULATORY_CARE_PROVIDER_SITE_OTHER): Payer: Medicare HMO | Admitting: Adult Health

## 2017-07-24 ENCOUNTER — Encounter: Payer: Self-pay | Admitting: Adult Health

## 2017-07-24 VITALS — BP 110/74 | HR 68 | Temp 97.0°F | Ht 66.0 in | Wt 166.0 lb

## 2017-07-24 DIAGNOSIS — M47812 Spondylosis without myelopathy or radiculopathy, cervical region: Secondary | ICD-10-CM | POA: Diagnosis not present

## 2017-07-24 DIAGNOSIS — E1121 Type 2 diabetes mellitus with diabetic nephropathy: Secondary | ICD-10-CM

## 2017-07-24 DIAGNOSIS — R6889 Other general symptoms and signs: Secondary | ICD-10-CM | POA: Diagnosis not present

## 2017-07-24 DIAGNOSIS — N182 Chronic kidney disease, stage 2 (mild): Secondary | ICD-10-CM | POA: Diagnosis not present

## 2017-07-24 DIAGNOSIS — Z79899 Other long term (current) drug therapy: Secondary | ICD-10-CM

## 2017-07-24 DIAGNOSIS — E7800 Pure hypercholesterolemia, unspecified: Secondary | ICD-10-CM

## 2017-07-24 DIAGNOSIS — H409 Unspecified glaucoma: Secondary | ICD-10-CM | POA: Diagnosis not present

## 2017-07-24 DIAGNOSIS — J449 Chronic obstructive pulmonary disease, unspecified: Secondary | ICD-10-CM

## 2017-07-24 DIAGNOSIS — I10 Essential (primary) hypertension: Secondary | ICD-10-CM

## 2017-07-24 DIAGNOSIS — M47816 Spondylosis without myelopathy or radiculopathy, lumbar region: Secondary | ICD-10-CM

## 2017-07-24 DIAGNOSIS — E1122 Type 2 diabetes mellitus with diabetic chronic kidney disease: Secondary | ICD-10-CM | POA: Diagnosis not present

## 2017-07-24 DIAGNOSIS — E559 Vitamin D deficiency, unspecified: Secondary | ICD-10-CM | POA: Diagnosis not present

## 2017-07-24 DIAGNOSIS — J45909 Unspecified asthma, uncomplicated: Secondary | ICD-10-CM

## 2017-07-24 DIAGNOSIS — Z0001 Encounter for general adult medical examination with abnormal findings: Secondary | ICD-10-CM

## 2017-07-24 DIAGNOSIS — M810 Age-related osteoporosis without current pathological fracture: Secondary | ICD-10-CM | POA: Diagnosis not present

## 2017-07-24 DIAGNOSIS — K219 Gastro-esophageal reflux disease without esophagitis: Secondary | ICD-10-CM

## 2017-07-24 DIAGNOSIS — G4700 Insomnia, unspecified: Secondary | ICD-10-CM

## 2017-07-24 DIAGNOSIS — Z0000 Encounter for general adult medical examination without abnormal findings: Secondary | ICD-10-CM

## 2017-07-24 NOTE — Patient Instructions (Signed)
Remember to schedule your colonoscopy!!    Aim for 7+ servings of fruits and vegetables daily  80+ fluid ounces of water or unsweet tea for healthy kidneys  Limit alcohol intake  Limit animal fats in diet for cholesterol and heart health - choose grass fed whenever available  Aim for low stress - take time to unwind and care for your mental health  Aim for 150 min of moderate intensity exercise weekly for heart health, and weights twice weekly for bone health  Aim for 7-9 hours of sleep daily      When it comes to diets, agreement about the perfect plan isn't easy to find, even among the experts. Experts at the [employer] developed an idea known as the Healthy Eating Plate. Just imagine a plate divided into logical, healthy portions.  The emphasis is on diet quality:  Load up on vegetables and fruits - one-half of your plate: Aim for color and variety, and remember that potatoes don't count.  Go for whole grains - one-quarter of your plate: Whole wheat, barley, wheat berries, quinoa, oats, brown rice, and foods made with them. If you want pasta, go with whole wheat pasta.  Protein power - one-quarter of your plate: Fish, chicken, beans, and nuts are all healthy, versatile protein sources. Limit red meat.  The diet, however, does go beyond the plate, offering a few other suggestions.  Use healthy plant oils, such as olive, canola, soy, corn, sunflower and peanut. Check the labels, and avoid partially hydrogenated oil, which have unhealthy trans fats.  If you're thirsty, drink water. Coffee and tea are good in moderation, but skip sugary drinks and limit milk and dairy products to one or two daily servings.  The type of carbohydrate in the diet is more important than the amount. Some sources of carbohydrates, such as vegetables, fruits, whole grains, and beans-are healthier than others.  Finally, stay active.

## 2017-07-25 DIAGNOSIS — H524 Presbyopia: Secondary | ICD-10-CM | POA: Diagnosis not present

## 2017-07-25 LAB — HEMOGLOBIN A1C
HEMOGLOBIN A1C: 6.3 %{Hb} — AB (ref <5.7)
MEAN PLASMA GLUCOSE: 134 (calc)
eAG (mmol/L): 7.4 (calc)

## 2017-07-25 LAB — CBC WITH DIFFERENTIAL/PLATELET
BASOS ABS: 20 {cells}/uL (ref 0–200)
BASOS PCT: 0.5 %
EOS ABS: 80 {cells}/uL (ref 15–500)
Eosinophils Relative: 2 %
HCT: 37.5 % (ref 35.0–45.0)
Hemoglobin: 12.5 g/dL (ref 11.7–15.5)
Lymphs Abs: 1336 cells/uL (ref 850–3900)
MCH: 29.7 pg (ref 27.0–33.0)
MCHC: 33.3 g/dL (ref 32.0–36.0)
MCV: 89.1 fL (ref 80.0–100.0)
MPV: 10.3 fL (ref 7.5–12.5)
Monocytes Relative: 8.3 %
Neutro Abs: 2232 cells/uL (ref 1500–7800)
Neutrophils Relative %: 55.8 %
PLATELETS: 257 10*3/uL (ref 140–400)
RBC: 4.21 10*6/uL (ref 3.80–5.10)
RDW: 13.1 % (ref 11.0–15.0)
TOTAL LYMPHOCYTE: 33.4 %
WBC: 4 10*3/uL (ref 3.8–10.8)
WBCMIX: 332 {cells}/uL (ref 200–950)

## 2017-07-25 LAB — COMPLETE METABOLIC PANEL WITH GFR
AG RATIO: 1.8 (calc) (ref 1.0–2.5)
ALT: 20 U/L (ref 6–29)
AST: 15 U/L (ref 10–35)
Albumin: 4.4 g/dL (ref 3.6–5.1)
Alkaline phosphatase (APISO): 62 U/L (ref 33–130)
BILIRUBIN TOTAL: 0.7 mg/dL (ref 0.2–1.2)
BUN: 13 mg/dL (ref 7–25)
CHLORIDE: 105 mmol/L (ref 98–110)
CO2: 30 mmol/L (ref 20–32)
Calcium: 10 mg/dL (ref 8.6–10.4)
Creat: 0.73 mg/dL (ref 0.50–0.99)
GFR, EST AFRICAN AMERICAN: 97 mL/min/{1.73_m2} (ref >=60)
GFR, Est Non African American: 84 mL/min/{1.73_m2} (ref >=60)
Globulin: 2.4 g/dL (calc) (ref 1.9–3.7)
Glucose, Bld: 110 mg/dL — ABNORMAL HIGH (ref 65–99)
Potassium: 4.7 mmol/L (ref 3.5–5.3)
Sodium: 140 mmol/L (ref 135–146)
TOTAL PROTEIN: 6.8 g/dL (ref 6.1–8.1)

## 2017-07-25 LAB — LIPID PANEL
Cholesterol: 155 mg/dL (ref <200)
HDL: 52 mg/dL (ref >50)
LDL Cholesterol (Calc): 86 mg/dL (calc)
Non-HDL Cholesterol (Calc): 103 mg/dL (calc) (ref <130)
Total CHOL/HDL Ratio: 3 (calc) (ref <5.0)
Triglycerides: 76 mg/dL (ref <150)

## 2017-07-25 LAB — TSH: TSH: 0.99 m[IU]/L (ref 0.40–4.50)

## 2017-07-25 LAB — HM DIABETES EYE EXAM

## 2017-07-29 ENCOUNTER — Encounter: Payer: Self-pay | Admitting: *Deleted

## 2017-09-08 DIAGNOSIS — H401133 Primary open-angle glaucoma, bilateral, severe stage: Secondary | ICD-10-CM | POA: Diagnosis not present

## 2017-09-16 ENCOUNTER — Ambulatory Visit: Payer: Self-pay | Admitting: Surgery

## 2017-09-16 DIAGNOSIS — Z1211 Encounter for screening for malignant neoplasm of colon: Secondary | ICD-10-CM | POA: Diagnosis not present

## 2017-09-16 NOTE — H&P (Signed)
CC: Self-referred for evaluation for colonoscopy  HPI: [name] is a very pleasant 13yoF with hx of HTN, HLD, DM, anxiety who is the sister of a patient whom I did a hemorrhoidectomy on. She self-referred to me for evaluation for colonoscopy. She states she had a colonoscopy in 2009 and this was her first one and she said the only remarkable finding was diverticulosis. She denied having had any polyps. She denies any complaints today including blood in her stool, black stool, changes in her weight, nausea, vomiting, diarrhea, constipation. She does take a daily fiber supplement. She denies having had any history of abdominal pain or diverticulitis. She had a remote hx of fall and tailbone pain following this - occasionally has throbbing sensation around coccyx.  PMH: HTN (well-controlled on hydrochlorothiazide), DM (well-controlled on oral hypoglycemics), HLD (well-controlled on statin), anxiety  PSH: TAH/BSO in 1979 for endometriosis  FHx: Denies FHx of malignancy  Social: Denies use of tobacco/EtOH/drugs  ROS: A comprehensive 10 system review of systems was completed with the patient and pertinent findings as noted above.  The patient is a 72 year old female.   Past Surgical History Alean Rinne, [place]; 09/16/2017 9:09 AM) Breast Biopsy  Left. Hysterectomy (not due to cancer) - Complete  Spinal Surgery - Neck  Tonsillectomy   Diagnostic Studies History Alean Rinne, [place]; 09/16/2017 9:09 AM) Colonoscopy  >10 years ago Mammogram  within last year Pap Smear  >5 years ago  Allergies Alean Rinne, RMA; 09/16/2017 9:11 AM) Allergies Reconciled  Penicillin G Pot in Dextrose *PENICILLINS*  Aspirin 81 *ANALGESICS - NonNarcotic*  HYDROmorphone HCl *ANALGESICS - OPIOID*  Sulfacetamide *CHEMICALS*  Codeine Phosphate *ANALGESICS - OPIOID*   Medication History Alean Rinne, RMA; 09/16/2017 9:14 AM) LORazepam (1MG  Tablet, Oral) Active. Dorzolamide HCl-Timolol Mal  (22.3-6.8MG /ML Solution, Ophthalmic) Active. Brimonidine Tartrate (0.2% Solution, Ophthalmic) Active. Latanoprost (0.005% Solution, Ophthalmic) Active. MetFORMIN HCl ER (500MG  Tablet ER 24HR, Oral) Active. Gabapentin (600MG  Tablet, Oral) Active. Cod Liver Oil (1000MG  Capsule, Oral) Active. Calcium (500MG  Tablet, Oral) Active. Lioresal (0.05MG /ML Solution, Intrathecal) Active. RaNITidine HCl (300MG  Tablet, Oral) Active. Medications Reconciled  Social History Alean Rinne, [place]; 09/16/2017 9:09 AM) Alcohol use  Occasional alcohol use. Caffeine use  Carbonated beverages, Coffee, Tea. No drug use  Tobacco use  Never smoker.  Family History Alean Rinne, [place]; 09/16/2017 9:09 AM) Arthritis  Father, Sister. Breast Cancer  Mother. Cerebrovascular Accident  Sister. Colon Polyps  Sister. Diabetes Mellitus  Sister. Heart Disease  Sister. Hypertension  Sister. Respiratory Condition  Sister.  Pregnancy / Birth History Alean Rinne, [place]; 09/16/2017 9:09 AM) Age at menarche  55 years. Age of menopause  <47 Contraceptive History  Intrauterine device. Gravida  2 Maternal age  64-20 Para  1  Other Problems Alean Rinne, [place]; 09/16/2017 9:09 AM) Anxiety Disorder  Arthritis  Asthma  Back Pain  Chronic Obstructive Lung Disease  Diabetes Mellitus  Diverticulosis  Gastroesophageal Reflux Disease  High blood pressure  Hypercholesterolemia  Lump In Breast  Oophorectomy     Review of Systems Harrell Gave M. Mavi Un MD; 09/16/2017 9:31 AM) General Not Present- Appetite Loss, Chills, Fatigue, Fever, Night Sweats, Weight Gain and Weight Loss. Skin Not Present- Change in Wart/Mole, Dryness, Hives, Jaundice, New Lesions, Non-Healing Wounds, Rash and Ulcer. HEENT Present- Ringing in the Ears, Seasonal Allergies and Wears glasses/contact lenses. Not Present- Earache, Hearing Loss, Hoarseness, Nose Bleed, Oral Ulcers, Sinus Pain, Sore Throat, Visual Disturbances  and Yellow Eyes. Respiratory Not Present- Bloody sputum, Chronic Cough, Difficulty Breathing, Snoring and  Wheezing. Breast Not Present- Breast Mass, Breast Pain, Nipple Discharge and Skin Changes. Cardiovascular Not Present- Chest Pain, Difficulty Breathing Lying Down, Leg Cramps, Palpitations, Rapid Heart Rate, Shortness of Breath and Swelling of Extremities. Gastrointestinal Not Present- Abdominal Pain, Bloating, Bloody Stool, Change in Bowel Habits, Chronic diarrhea, Constipation, Difficulty Swallowing, Excessive gas, Gets full quickly at meals, Hemorrhoids, Indigestion, Nausea, Rectal Pain and Vomiting. Female Genitourinary Not Present- Frequency, Nocturia, Painful Urination, Pelvic Pain and Urgency. Musculoskeletal Present- Back Pain, Joint Pain, Joint Stiffness, Muscle Pain and Muscle Weakness. Not Present- Swelling of Extremities. Neurological Present- Headaches, Tingling, Trouble walking and Weakness. Not Present- Decreased Memory, Fainting, Numbness, Seizures and Tremor. Psychiatric Present- Anxiety. Not Present- Bipolar, Change in Sleep Pattern, Depression, Fearful and Frequent crying. Endocrine Present- Cold Intolerance and Heat Intolerance. Not Present- Excessive Hunger, Hair Changes, Hot flashes and New Diabetes. Hematology Present- Persistent Infections. Not Present- Abnormal Bleeding, Blood Clots, Blood Thinners, Easy Bruising, Excessive bleeding, Gland problems and HIV.  Vitals Mardene Celeste King RMA; 09/16/2017 9:10 AM) 09/16/2017 9:09 AM Weight: 165.6 lb Height: 66in Body Surface Area: 1.85 m Body Mass Index: 26.73 kg/m  Temp.: 98.110F  Pulse: 96 (Regular)  BP: 125/82 (Sitting, Left Arm, Standard)       Physical Exam Harrell Gave M. Melik Blancett MD; 09/16/2017 9:31 AM) The physical exam findings are as follows: Note:Constitutional: No acute distress; conversant; no deformities Eyes: Moist conjunctiva; no lid lag; anicteric sclerae; pupils equal round and reactive to  light Neck: Trachea midline; no palpable thyromegaly Lungs: Normal respiratory effort; no tactile fremitus CV: Regular rate and rhythm; no palpable thrill; no pitting edema GI: Abdomen soft, nontender, nondistended; no palpable hepatosplenomegaly MSK: Normal gait; no clubbing/cyanosis Psychiatric: Appropriate affect; alert and oriented 3 Lymphatic: No palpable cervical or axillary lymphadenopathy     Assessment & Plan Harrell Gave M. Julicia Krieger MD; 09/16/2017 9:33 AM) SCREENING FOR MALIGNANT NEOPLASM OF COLON (Z12.11) Story: [name] is a pleasant 71yoF here to schedule screening colonoscopy - self-referred Impression: -The anatomy and physiology of the GI tract was discussed at length with the patient. The pathophysiology of colon polyps and cancer was discussed at length with associated pictures. -The planned procedure including possible polypectomy, material risks (including, but not limited to, pain, bleeding, need for blood transfusion, perforation, damage to surrounding structures - liver/spleen, need for additional procedures, need for stoma, recurrence of polyps, pneumonia, heart attack, stroke, death) benefits and alternatives to the procedure were discussed at length. The patient's questions were answered to her satisfaction, she voiced understanding and elected to proceed with colonoscopy. Additionally, we discussed typical postoperative expectations and the recovery process.  Signed electronically by Ileana Roup, MD (09/16/2017 9:33 AM)

## 2017-10-07 ENCOUNTER — Other Ambulatory Visit: Payer: Self-pay | Admitting: Internal Medicine

## 2017-10-09 ENCOUNTER — Other Ambulatory Visit: Payer: Self-pay | Admitting: Internal Medicine

## 2017-10-10 ENCOUNTER — Other Ambulatory Visit: Payer: Self-pay | Admitting: Physician Assistant

## 2017-10-10 MED ORDER — DICLOFENAC SODIUM 1 % TD GEL: TRANSDERMAL | 0 refills | Status: DC

## 2017-10-13 ENCOUNTER — Other Ambulatory Visit: Payer: Self-pay

## 2017-10-13 ENCOUNTER — Ambulatory Visit (HOSPITAL_COMMUNITY)
Admission: RE | Admit: 2017-10-13 | Discharge: 2017-10-13 | Disposition: A | Discharge status: Home / Self Care | Payer: Medicare HMO | Source: Ambulatory Visit | Attending: Surgery | Admitting: Surgery

## 2017-10-13 ENCOUNTER — Encounter (HOSPITAL_COMMUNITY): Payer: Self-pay | Admitting: Anesthesiology

## 2017-10-13 ENCOUNTER — Encounter (HOSPITAL_COMMUNITY): Payer: Self-pay | Admitting: *Deleted

## 2017-10-13 ENCOUNTER — Encounter (HOSPITAL_COMMUNITY)
Admission: RE | Disposition: A | Discharge status: Home / Self Care | Payer: Self-pay | Source: Ambulatory Visit | Attending: Surgery

## 2017-10-13 DIAGNOSIS — Z538 Procedure and treatment not carried out for other reasons: Secondary | ICD-10-CM | POA: Diagnosis not present

## 2017-10-13 DIAGNOSIS — Z1211 Encounter for screening for malignant neoplasm of colon: Secondary | ICD-10-CM | POA: Insufficient documentation

## 2017-10-13 SURGERY — CANCELLED PROCEDURE

## 2017-10-13 MED ORDER — PEG 3350-KCL-NABCB-NACL-NASULF 236 G PO SOLR: 4.0000 L | Freq: Once | ORAL | 0 refills | Status: AC

## 2017-10-13 NOTE — Discharge Instructions (Signed)
Do not take aspirin or NSAIDs (advil, ibuprofen, motrin, aleve) 7 days prior to your procedure  DIET 3 days prior to your procedure - do not eat any fruits, vegetables, nuts or seeds  The day before your colonoscopy: -Do not eat solid foods, just consume clear liquids (things you can read the newspaper through). Apple or Reverie Vaquera gape juice, clear broth, coffee or tea (without milk or creamer), clear carbonated beverages, gatorade, Kool-aid, jell-o, popsickles, water, etc are all ok. Avoid anything with red dye (red kool-aid or gatorade, etc)  If your colonoscopy is scheduled in the morning (before 9 a.m.): -Step 1: At 8 p.m. the evening before your colonoscopy, begin drinking 8-ounce glasses of the preparation every 10 minutes for a total of 4 liters of the bowel prep solution.  -Do not eat or drink anything 3 hours before your colonoscopy  If your colonoscopy is scheduled in the morning (at or after 9 a.m.), use the split dose bowel prep -Split dosing bowel prep: Split dose consists of drinking some of you prep the day before your colonoscopy and some on the day of the procedure. -STEP 1: At 6 p.m., the evening before your colonoscopy, begin drinking 8-ounce glasses of preparation every 10 minutes for a total of 12 glasses (3 liters) -STEP 2: The day of your colonoscopy at 5 a.m. Drink 8 ounce glasses of the preparation every 10 minutes for a total of 4 glasses (1 Liter). Please make sure you finish taking the prep by 6 a.m. -Do not eat or drink anything 3 hours before your colonoscopy  AFTERNOON COLONOSCOPY Same day bowel prep -At 6 a.m. The morning of your colonoscopy, start drinking 8 ounce glasses of the prep every 10 minutes until it is finished with goal to finish all of the prep (4 liters) no later than 4 hours after your start time (10 a.m.) -After you finish the prep you may drink either water or apple juice but do not consume ANYTHING 3 hours before your colonoscopy.

## 2017-10-13 NOTE — H&P (Signed)
Case being rescheduled due to poor prep. Patient passed brown solid stool in preprocedure

## 2017-10-13 NOTE — Anesthesia Preprocedure Evaluation (Deleted)
Anesthesia Evaluation  Patient identified by MRN, date of birth, ID band Patient awake    Reviewed: Allergy & Precautions, H&P , NPO status , Patient's Chart, lab work & pertinent test results  Airway Mallampati: II   Neck ROM: full    Dental   Pulmonary asthma , COPD,    breath sounds clear to auscultation       Cardiovascular hypertension,  Rhythm:regular Rate:Normal     Neuro/Psych  Headaches, PSYCHIATRIC DISORDERS Anxiety    GI/Hepatic GERD  ,  Endo/Other  diabetes, Type 2  Renal/GU      Musculoskeletal  (+) Arthritis ,   Abdominal   Peds  Hematology   Anesthesia Other Findings   Reproductive/Obstetrics                             Anesthesia Physical Anesthesia Plan  ASA: III  Anesthesia Plan: MAC   Post-op Pain Management:    Induction: Intravenous  PONV Risk Score and Plan: 2 and Ondansetron, Propofol infusion and Treatment may vary due to age or medical condition  Airway Management Planned: Nasal Cannula  Additional Equipment:   Intra-op Plan:   Post-operative Plan:   Informed Consent: I have reviewed the patients History and Physical, chart, labs and discussed the procedure including the risks, benefits and alternatives for the proposed anesthesia with the patient or authorized representative who has indicated his/her understanding and acceptance.     Plan Discussed with: CRNA, Anesthesiologist and Surgeon  Anesthesia Plan Comments:         Anesthesia Quick Evaluation

## 2017-10-27 ENCOUNTER — Other Ambulatory Visit: Payer: Self-pay | Admitting: Internal Medicine

## 2017-10-31 ENCOUNTER — Ambulatory Visit (HOSPITAL_COMMUNITY): Payer: Medicare HMO | Admitting: Certified Registered Nurse Anesthetist

## 2017-10-31 ENCOUNTER — Encounter (HOSPITAL_COMMUNITY)
Admission: RE | Disposition: A | Discharge status: Home / Self Care | Payer: Self-pay | Source: Ambulatory Visit | Attending: Surgery

## 2017-10-31 ENCOUNTER — Encounter (HOSPITAL_COMMUNITY): Payer: Self-pay | Admitting: Emergency Medicine

## 2017-10-31 ENCOUNTER — Other Ambulatory Visit: Payer: Self-pay

## 2017-10-31 ENCOUNTER — Ambulatory Visit (HOSPITAL_COMMUNITY)
Admission: RE | Admit: 2017-10-31 | Discharge: 2017-10-31 | Disposition: A | Discharge status: Home / Self Care | Payer: Medicare HMO | Source: Ambulatory Visit | Attending: Surgery | Admitting: Surgery

## 2017-10-31 DIAGNOSIS — K219 Gastro-esophageal reflux disease without esophagitis: Secondary | ICD-10-CM | POA: Insufficient documentation

## 2017-10-31 DIAGNOSIS — E119 Type 2 diabetes mellitus without complications: Secondary | ICD-10-CM | POA: Insufficient documentation

## 2017-10-31 DIAGNOSIS — I10 Essential (primary) hypertension: Secondary | ICD-10-CM | POA: Diagnosis not present

## 2017-10-31 DIAGNOSIS — D125 Benign neoplasm of sigmoid colon: Secondary | ICD-10-CM | POA: Diagnosis not present

## 2017-10-31 DIAGNOSIS — G4700 Insomnia, unspecified: Secondary | ICD-10-CM | POA: Diagnosis not present

## 2017-10-31 DIAGNOSIS — Z1211 Encounter for screening for malignant neoplasm of colon: Secondary | ICD-10-CM | POA: Diagnosis not present

## 2017-10-31 DIAGNOSIS — J449 Chronic obstructive pulmonary disease, unspecified: Secondary | ICD-10-CM | POA: Diagnosis not present

## 2017-10-31 DIAGNOSIS — K5730 Diverticulosis of large intestine without perforation or abscess without bleeding: Secondary | ICD-10-CM | POA: Insufficient documentation

## 2017-10-31 DIAGNOSIS — K635 Polyp of colon: Secondary | ICD-10-CM | POA: Insufficient documentation

## 2017-10-31 DIAGNOSIS — E7800 Pure hypercholesterolemia, unspecified: Secondary | ICD-10-CM | POA: Insufficient documentation

## 2017-10-31 DIAGNOSIS — E785 Hyperlipidemia, unspecified: Secondary | ICD-10-CM | POA: Insufficient documentation

## 2017-10-31 HISTORY — PX: COLONOSCOPY WITH PROPOFOL: SHX5780

## 2017-10-31 HISTORY — PX: BIOPSY: SHX5522

## 2017-10-31 LAB — GLUCOSE, CAPILLARY: GLUCOSE-CAPILLARY: 103 mg/dL — AB (ref 70–99)

## 2017-10-31 SURGERY — COLONOSCOPY WITH PROPOFOL
Anesthesia: Monitor Anesthesia Care

## 2017-10-31 MED ORDER — PROPOFOL 10 MG/ML IV BOLUS
INTRAVENOUS | Status: AC
  Filled 2017-10-31: qty 20

## 2017-10-31 MED ORDER — ONDANSETRON HCL 4 MG/2ML IJ SOLN
INTRAMUSCULAR | Status: DC | PRN
  Administered 2017-10-31: 4 mg via INTRAVENOUS

## 2017-10-31 MED ORDER — PROPOFOL 10 MG/ML IV BOLUS
INTRAVENOUS | Status: DC | PRN
  Administered 2017-10-31: 10 mg via INTRAVENOUS
  Administered 2017-10-31: 30 mg via INTRAVENOUS

## 2017-10-31 MED ORDER — LACTATED RINGERS IV SOLN
INTRAVENOUS | Status: DC | PRN
  Administered 2017-10-31: 08:00:00 via INTRAVENOUS

## 2017-10-31 MED ORDER — PROPOFOL 500 MG/50ML IV EMUL
INTRAVENOUS | Status: DC | PRN
  Administered 2017-10-31: 200 ug/kg/min via INTRAVENOUS

## 2017-10-31 MED ORDER — PROPOFOL 10 MG/ML IV BOLUS
INTRAVENOUS | Status: AC
  Filled 2017-10-31: qty 40

## 2017-10-31 NOTE — Anesthesia Postprocedure Evaluation (Signed)
Anesthesia Post Note  Patient: Sara [name]  Procedure(s) Performed: COLONOSCOPY SCREENING (N/A ) BIOPSY     Patient location during evaluation: PACU Anesthesia Type: MAC Level of consciousness: awake Pain management: pain level controlled Vital Signs Assessment: post-procedure vital signs reviewed and stable Respiratory status: spontaneous breathing Cardiovascular status: stable Anesthetic complications: no    Last Vitals:  Vitals:   10/31/17 1050 10/31/17 1055  BP: 126/71   Pulse: 76   Resp: 18   Temp:    SpO2: 98% 97%    Last Pain:  Vitals:   10/31/17 0756  TempSrc: Oral                 [name]

## 2017-10-31 NOTE — Discharge Instructions (Signed)

## 2017-10-31 NOTE — H&P (Signed)
CC: Here today for colonoscopy  HPI: [name] is a very pleasant 53yoF with hx of HTN, HLD, DM, anxiety who is the sister of a patient whom I did a hemorrhoidectomy on. She self-referred to me for evaluation for colonoscopy. She states she had a colonoscopy in 2009 and this was her first one and she said the only remarkable finding was diverticulosis. She denied having had any polyps. She denies any complaints today including blood in her stool, black stool, changes in her weight, nausea, vomiting, diarrhea, constipation. She does take a daily fiber supplement. She denies having had any history of abdominal pain or diverticulitis. She had a remote hx of fall and tailbone pain following this - occasionally has throbbing sensation around coccyx.  PMH: HTN (well-controlled on hydrochlorothiazide), DM (well-controlled on oral hypoglycemics), HLD (well-controlled on statin), anxiety  PSH: TAH/BSO in 1979 for endometriosis  FHx: Denies FHx of malignancy  Social: Denies use of tobacco/EtOH/drugs  ROS: A comprehensive 10 system review of systems was completed with the patient and pertinent findings as noted above.  Past Medical History:  Diagnosis Date  . Anxiety 07/23/2017  . Asthma   . Bronchitis    hx of;>5yrs ago  . Chronic neck pain    arthritis  . COPD (chronic obstructive pulmonary disease) ([place])   . Degenerative disc disease    neck  . DeQuervain's disease (tenosynovitis)   . Diverticulitis   . Dizziness    related to neck issues  . Frozen shoulder September 2012  . GERD (gastroesophageal reflux disease)    takes Ranitidine nightly  . Glaucoma   . Hay fever   . Headache(784.0)    related neck issues  . High cholesterol    takes Lipitor daily  . History of shingles 2012  . Hypertension    takes Lotensin HCT daily  . Insomnia    d/t pain and takes Ativan nightly  . Osteoporosis   . Sarcoidosis >31yrs ago   no problems since  . Type II or unspecified type  diabetes mellitus without mention of complication, not stated as uncontrolled    takes Metformin nightly    Past Surgical History:  Procedure Laterality Date  . ABDOMINAL HYSTERECTOMY  1975  . ANTERIOR CERVICAL DECOMP/DISCECTOMY FUSION  07/31/2011   Procedure: ANTERIOR CERVICAL DECOMPRESSION/DISCECTOMY FUSION 3 LEVELS;  Surgeon: Hosie Spangle, MD;  Location: [place] [facility];  Service: Neurosurgery;  Laterality: N/A;  Cervical four-five,Cervical five-six,Cervical six-seven anterior cervical decompression with fusion plating and bonegraft  . BREAST SURGERY  20+yrs ago   left breast;benign  . CARPAL TUNNEL RELEASE  2012  . COLONOSCOPY    . REFRACTIVE SURGERY  2011   bilateral  . TONSILLECTOMY  30+yrs ago  . WRIST SURGERY     ORIF left wrist and then removed d/t protruding screw in 2012    Family History  Problem Relation Age of Onset  . Cancer Mother 21       breast cancer  . Pneumonia Father   . Leukemia Father   . Glaucoma Father   . Mitral valve prolapse Sister   . Hypertension Sister   . Pulmonary embolism Sister   . Diabetes Sister   . HIV Son   . Anesthesia problems Neg Hx   . Hypotension Neg Hx   . Malignant hyperthermia Neg Hx   . Pseudochol deficiency Neg Hx     Social:  reports that she has never smoked. She has never used smokeless tobacco. She reports that  she does not drink alcohol or use drugs.  Allergies:  Allergies  Allergen Reactions  . Penicillins Hives and Shortness Of Breath  . Aspirin Other (See Comments)    Stomach upset, but low dose is ok  . Hydromorphone Nausea Only    Other reaction(s): Sweating (intolerance)  . Sulfa Antibiotics Other (See Comments)    unknown  . Codeine Rash    Medications: I have reviewed the patient's current medications.  Results for orders placed or performed during the hospital encounter of 10/31/17 (from the past 48 hour(s))  Glucose, capillary     Status: Abnormal   Collection Time: 10/31/17  8:11 AM  Result  Value Ref Range   Glucose-Capillary 103 (H) 70 - 99 mg/dL    No results found.  ROS - all of the below systems have been reviewed with the patient and positives are indicated with bold text General: chills, fever or night sweats Eyes: blurry vision or double vision ENT: epistaxis or sore throat Allergy/Immunology: itchy/watery eyes or nasal congestion Hematologic/Lymphatic: bleeding problems, blood clots or swollen lymph nodes Endocrine: temperature intolerance or unexpected weight changes Breast: new or changing breast lumps or nipple discharge Resp: cough, shortness of breath, or wheezing CV: chest pain or dyspnea on exertion GI: as per HPI GU: dysuria, trouble voiding, or hematuria MSK: joint pain or joint stiffness Neuro: TIA or stroke symptoms Derm: pruritus and skin lesion changes Psych: anxiety and depression  PE Blood pressure 132/62, pulse 66, temperature 98.1 F (36.7 C), temperature source Oral, resp. rate 15, height 5\' 6"  (1.676 m), weight 74.8 kg, SpO2 97 %. Constitutional: NAD; conversant; no deformities Eyes: Moist conjunctiva; no lid lag; anicteric; PERRL Neck: Trachea midline; no thyromegaly Lungs: Normal respiratory effort; no tactile fremitus CV: RRR; no palpable thrills; no pitting edema GI: Abd soft, NT/ND; no palpable hepatosplenomegaly MSK: Normal gait; no clubbing/cyanosis Psychiatric: Appropriate affect; alert and oriented x3 Lymphatic: No palpable cervical or axillary lymphadenopathy  Results for orders placed or performed during the hospital encounter of 10/31/17 (from the past 48 hour(s))  Glucose, capillary     Status: Abnormal   Collection Time: 10/31/17  8:11 AM  Result Value Ref Range   Glucose-Capillary 103 (H) 70 - 99 mg/dL    A/P: [name] is a pleasant 71yoF here to schedule screening colonoscopy - self-referred  -The anatomy and physiology of the GI tract was discussed again today at length with the patient. The pathophysiology of  colon polyps and cancer was discussed at length with associated pictures. -The planned procedure including possible polypectomy, material risks (including, but not limited to, pain, bleeding, need for blood transfusion, perforation, damage to surrounding structures - liver/spleen, need for additional procedures, need for stoma, recurrence of polyps, pneumonia, heart attack, stroke, death) benefits and alternatives to the procedure were discussed at length. The patient's questions were answered to her satisfaction, she voiced understanding and elected to proceed with colonoscopy. Additionally, we discussed typical postoperative expectations and the recovery process.  Sharon Mt. Dema Severin, M.D. General and Colorectal Surgery [facility]

## 2017-10-31 NOTE — Op Note (Signed)
[facility] Patient Name: Sara [name] Procedure Date: 10/31/2017 MRN: [id number] Attending MD: Ileana Roup MD, MD Date of Birth: August 23, 1945 CSN: [id number] Age: 72 Admit Type: Outpatient Procedure:                Colonoscopy Indications:              Screening for colorectal malignant neoplasm Providers:                Sharon Mt. Generoso Cropper MD, MD, Baird [facility], RN,                            Elspeth Cho Tech., Technician, Virgia Land, CRNA Referring MD:              Medicines:                Monitored Anesthesia Care Complications:            No immediate complications. Estimated Blood Loss:     Estimated blood loss was minimal. Procedure:                Pre-Anesthesia Assessment:                           - Prior to the procedure, a History and Physical                            was performed, and patient medications, allergies                            and sensitivities were reviewed. The patient's                            tolerance of previous anesthesia was reviewed.                           - The risks and benefits of the procedure and the                            sedation options and risks were discussed with the                            patient. All questions were answered and informed                            consent was obtained.                           - Patient identification and proposed procedure                            were verified prior to the procedure by the                            physician, the nurse and the anesthetist. The  procedure was verified in the pre-procedure area in                            the endoscopy suite.                           - ASA Grade Assessment: II - A patient with mild                            systemic disease.                           After obtaining informed consent, the colonoscope                            was passed under direct vision. Throughout the                             procedure, the patient's blood pressure, pulse, and                            oxygen saturations were monitored continuously. The                            [id number]-[id number] ([id number] Olympus peds colonscope was                            introduced through the anus and advanced to the the                            cecum, identified by appendiceal orifice and                            ileocecal valve. The colonoscopy was performed                            without difficulty. The patient tolerated the                            procedure well. The quality of the bowel                            preparation was adequate. The bowel preparation                            used was GoLYTELY. Scope In: 9:47:47 AM Scope Out: 10:30:22 AM Scope Withdrawal Time: 0 hours 28 minutes 34 seconds  Total Procedure Duration: 0 hours 42 minutes 35 seconds  Findings:      The perianal and digital rectal examinations were normal. Pertinent       negatives include normal sphincter tone.      A 2 mm polyp was found in the sigmoid colon. The polyp was semi-sessile.       The polyp was removed with a jumbo cold forceps. Resection and retrieval       were  complete. Estimated blood loss was minimal.      Multiple medium-mouthed diverticula were found in the entire colon.      The exam was otherwise without abnormality on direct and retroflexion       views. Impression:               - One 2 mm polyp in the sigmoid colon, removed with                            a jumbo cold forceps. Resected and retrieved.                           - Diverticulosis in the entire examined colon.                           - The examination was otherwise normal on direct                            and retroflexion views. Moderate Sedation:      N/A- Per Anesthesia Care Recommendation:           - Discharge patient to home.                           - High fiber diet indefinitely.                            - No aspirin, ibuprofen, naproxen, or other                            non-steroidal anti-inflammatory drugs for 10 days                            after polyp removal.                           - Await pathology results.                           - Repeat colonoscopy in 5 years for surveillance                            based on pathology results.                           - Return to primary care physician at appointment                            to be scheduled. Procedure Code(s):        --- Professional ---                           [phone], Colonoscopy, flexible; with biopsy, single                            or multiple Diagnosis Code(s):        --- Professional ---  Z12.11, Encounter for screening for malignant                            neoplasm of colon                           D12.5, Benign neoplasm of sigmoid colon                           K57.30, Diverticulosis of large intestine without                            perforation or abscess without bleeding CPT copyright 2017 American Medical [employer]. All rights reserved. The codes documented in this report are preliminary and upon coder review may  be revised to meet current compliance requirements. Nadeen Landau, MD Ileana Roup MD, MD 10/31/2017 10:38:22 AM This report has been signed electronically. Number of Addenda: 0

## 2017-10-31 NOTE — Transfer of Care (Signed)
Immediate Anesthesia Transfer of Care Note  Patient: Gean Maidens  Procedure(s) Performed: COLONOSCOPY SCREENING (N/A ) BIOPSY  Patient Location: PACU  Anesthesia Type:MAC  Level of Consciousness: awake, alert  and oriented  Airway & Oxygen Therapy: Patient Spontanous Breathing and Patient connected to face mask oxygen  Post-op Assessment: Report given to RN and Post -op Vital signs reviewed and stable  Post vital signs: Reviewed and stable  Last Vitals:  Vitals Value Taken Time  BP    Temp    Pulse 88 10/31/2017 10:37 AM  Resp 17 10/31/2017 10:37 AM  SpO2 100 % 10/31/2017 10:37 AM  Vitals shown include unvalidated device data.  Last Pain:  Vitals:   10/31/17 0756  TempSrc: Oral         Complications: No apparent anesthesia complications

## 2017-10-31 NOTE — Anesthesia Preprocedure Evaluation (Addendum)
Anesthesia Evaluation  Patient identified by MRN, date of birth, ID band Patient awake    Reviewed: Allergy & Precautions, NPO status , Patient's Chart, lab work & pertinent test results  History of Anesthesia Complications (+) Emergence Delirium  Airway Mallampati: II  TM Distance: >3 FB     Dental   Pulmonary asthma , COPD,    breath sounds clear to auscultation       Cardiovascular hypertension,  Rhythm:Regular Rate:Normal     Neuro/Psych  Headaches,    GI/Hepatic Neg liver ROS, GERD  ,  Endo/Other  diabetes  Renal/GU Renal disease     Musculoskeletal   Abdominal   Peds  Hematology   Anesthesia Other Findings   Reproductive/Obstetrics                            Anesthesia Physical Anesthesia Plan  ASA: III  Anesthesia Plan: MAC   Post-op Pain Management:    Induction: Intravenous  PONV Risk Score and Plan: Ondansetron, Dexamethasone and Midazolam  Airway Management Planned: Simple Face Mask and Nasal Cannula  Additional Equipment:   Intra-op Plan:   Post-operative Plan:   Informed Consent: I have reviewed the patients History and Physical, chart, labs and discussed the procedure including the risks, benefits and alternatives for the proposed anesthesia with the patient or authorized representative who has indicated his/her understanding and acceptance.   Dental advisory given  Plan Discussed with: CRNA and Anesthesiologist  Anesthesia Plan Comments:        Anesthesia Quick Evaluation

## 2017-11-03 ENCOUNTER — Encounter (HOSPITAL_COMMUNITY): Payer: Self-pay | Admitting: Surgery

## 2017-11-12 DIAGNOSIS — H401133 Primary open-angle glaucoma, bilateral, severe stage: Secondary | ICD-10-CM | POA: Diagnosis not present

## 2017-11-21 ENCOUNTER — Ambulatory Visit: Payer: Self-pay | Admitting: Physician Assistant

## 2017-11-25 NOTE — Progress Notes (Deleted)
FOLLOW UP  Assessment and Plan:    Continue diet and meds as discussed. Further disposition pending results of labs. Discussed med's effects and SE's.   Over 30 minutes of exam, counseling, chart review, and critical decision making was performed Future Appointments  Date Time Provider [place]  11/27/2017  9:45 AM Vicie Mutters, PA-C [facility] None  02/25/2018  9:00 AM Vicie Mutters, PA-C [facility] None  08/06/2018  9:30 AM Vicie Mutters, PA-C [facility] None     HPI 72 y.o. female  presents for 3 month follow up on hypertension, cholesterol, diabetes and vitamin D deficiency.   Her blood pressure {HAS HAS NOT:18834} been controlled at home, today their BP is     She {DOES_DOES GBT:51761} workout. She denies chest pain, shortness of breath, dizziness.  BMI is There is no height or weight on file to calculate BMI., she is working on diet and exercise. Wt Readings from Last 3 Encounters:  10/31/17 165 lb (74.8 kg)  07/24/17 166 lb (75.3 kg)  07/09/17 165 lb 12.8 oz (75.2 kg)     She {ACTION; IS/IS YWV:37106269} on cholesterol medication and denies myalgias. Her cholesterol {ACTION; IS/IS NOT:21021397} at goal. The cholesterol last visit was:   Lab Results  Component Value Date   CHOL 155 07/24/2017   HDL 52 07/24/2017   LDLCALC 86 07/24/2017   TRIG 76 07/24/2017   CHOLHDL 3.0 07/24/2017    She {Has/has not:18111} been working on diet and exercise for Diabetes {with or without complications:30421263}, she {ACTION; IS/IS NOT:21021397} on bASA, she {ACTION; IS/IS NOT:21021397} on ACE/ARB, and denies {Symptoms; diabetes w/o none:19199}. Last A1C was:  Lab Results  Component Value Date   HGBA1C 6.3 (H) 07/24/2017   Lab Results  Component Value Date   GFRAA 97 07/24/2017   Patient is on Vitamin D supplement.   Lab Results  Component Value Date   VD25OH 64 04/11/2017         Current Medications:  Current Outpatient Medications on File Prior to Visit   Medication Sig  . ACCU-CHEK SOFTCLIX LANCETS lancets CHECK BLOOD GLUCOSE 1 TIME EVERY DAY. Dx-R73.03  . acetaminophen (TYLENOL ARTHRITIS PAIN) 650 MG CR tablet Take 1,300 mg by mouth every 8 (eight) hours as needed. For pain  . albuterol (PROVENTIL HFA;VENTOLIN HFA) 108 (90 Base) MCG/ACT inhaler Inhale 2 puffs into the lungs every 6 (six) hours as needed for wheezing or shortness of breath (cough).  Marland Kitchen alendronate (FOSAMAX) 70 MG tablet TAKE 1 TABLET ONCE A WEEK. TAKE WITH A FULL GLASS OF WATER ON AN EMPTY STOMACH. (Patient not taking: Reported on 07/24/2017)  . Ascorbic Acid (VITAMIN C PO) Take 500 mg by mouth daily.   Marland Kitchen atorvastatin (LIPITOR) 40 MG tablet TAKE 1 TABLET EVERY DAY  . baclofen (LIORESAL) 10 MG tablet TAKE 1/2 TO 1 TABLET BY MOUTH 2 TO 3 TIMES DAILY AS NEEDED FOR MUSCLE SPASM  . benazepril-hydrochlorthiazide (LOTENSIN HCT) 20-25 MG tablet TAKE 1 TABLET EVERY DAY (Patient taking differently: TAKE 0.5 TABLET BY MOUTH EVERY DAY)  . Blood Glucose Monitoring Suppl (ACCU-CHEK AVIVA PLUS) w/Device KIT USE AS DIRECTED  TO CHECK BLOOD SUGAR ONE TIME DAILY  . brimonidine (ALPHAGAN) 0.2 % ophthalmic solution Place 1 drop into both eyes daily.  . calcium-vitamin D (OSCAL WITH D) 500-200 MG-UNIT tablet Take 1 tablet by mouth daily with breakfast.  . Cholecalciferol (VITAMIN D3) 5000 units TABS Take 5,000 Units by mouth daily.  . diclofenac sodium (VOLTAREN) 1 % GEL APPLY  2  TO  4  GRAMS TOPICALLY FOUR TIMES DAILY  . dorzolamide-timolol (COSOPT) 22.3-6.8 MG/ML ophthalmic solution Place 1 drop into both eyes 2 (two) times daily.  . fluticasone (FLONASE) 50 MCG/ACT nasal spray SHAKE LIQUID AND USE 2 SPRAYS IN EACH NOSTRIL DAILY  . gabapentin (NEURONTIN) 600 MG tablet TAKE 1/2 TO 1 TABLET TWO TO THREE TIMES DAILY FOR PAIN  . glucose blood (ACCU-CHEK AVIVA PLUS) test strip Check blood glucose 1 time daily-DX-R73.03  . latanoprost (XALATAN) 0.005 % ophthalmic solution Place 1 drop into both eyes at  bedtime.  Marland Kitchen LORazepam (ATIVAN) 1 MG tablet Take 1/2 to 1 tablet  1 to 2  x / day ONLY if needed for Anxiety Attack  & please try to limit to 5 days /week to avoid addiction  . metFORMIN (GLUCOPHAGE-XR) 500 MG 24 hr tablet TAKE 1 TO 2 TABLETS TWICE DAILY WITH MEALS FOR DIABETES  . Multiple Vitamin (MULTIVITAMIN WITH MINERALS) TABS tablet Take 1 tablet by mouth daily.  . promethazine-dextromethorphan (PROMETHAZINE-DM) 6.25-15 MG/5ML syrup Take 5 mLs by mouth 4 (four) times daily as needed for cough. (Patient not taking: Reported on 10/06/2017)  . ranitidine (ZANTAC) 300 MG tablet TAKE 1 TABLET AT BEDTIME   No current facility-administered medications on file prior to visit.     Medical History:  Past Medical History:  Diagnosis Date  . Anxiety 07/23/2017  . Asthma   . Bronchitis    hx of;>58yr ago  . Chronic neck pain    arthritis  . [place]   . Degenerative disc disease    neck  . DeQuervain's disease (tenosynovitis)   . Diverticulitis   . Dizziness    related to neck issues  . Frozen shoulder September 2012  . GERD (gastroesophageal reflux disease)    takes Ranitidine nightly  . Glaucoma   . Hay fever   . Headache(784.0)    related neck issues  . High cholesterol    takes Lipitor daily  . History of shingles 2012  . Hypertension    takes Lotensin HCT daily  . Insomnia    d/t pain and takes Ativan nightly  . Osteoporosis   . Sarcoidosis >355yrago   no problems since  . Type II or unspecified type diabetes mellitus without mention of complication, not stated as uncontrolled    takes Metformin nightly   Allergies:  Allergies  Allergen Reactions  . Penicillins Hives and Shortness Of Breath  . Aspirin Other (See Comments)    Stomach upset, but low dose is ok  . Hydromorphone Nausea Only    Other reaction(s): Sweating (intolerance)  . Sulfa Antibiotics Other (See Comments)    unknown  . Codeine Rash     Review of Systems:   ROS  Family history- Review and unchanged Social history- Review and unchanged Physical Exam: There were no vitals taken for this visit. Wt Readings from Last 3 Encounters:  10/31/17 165 lb (74.8 kg)  07/24/17 166 lb (75.3 kg)  07/09/17 165 lb 12.8 oz (75.2 kg)   General Appearance: Well nourished, in no apparent distress. Eyes: PERRLA, EOMs, conjunctiva no swelling or erythema Sinuses: No Frontal/maxillary tenderness ENT/Mouth: Ext aud canals clear, TMs without erythema, bulging. No erythema, swelling, or exudate on post pharynx.  Tonsils not swollen or erythematous. Hearing normal.  Neck: Supple, thyroid normal.  Respiratory: Respiratory effort normal, BS equal bilaterally without rales, rhonchi, wheezing or stridor.  Cardio: RRR with no MRGs. Brisk peripheral pulses without edema.  Abdomen: Soft, + BS.  Non tender, no guarding, rebound, hernias, masses. Lymphatics: Non tender without lymphadenopathy.  Musculoskeletal: Full ROM, 5/5 strength, {PSY - GAIT AND STATION:22860} gait Skin: Warm, dry without rashes, lesions, ecchymosis.  Neuro: Cranial nerves intact. No cerebellar symptoms.  Psych: Awake and oriented X 3, normal affect, Insight and Judgment appropriate.    Vicie Mutters, PA-C 6:09 AM [facility]

## 2017-11-27 ENCOUNTER — Ambulatory Visit: Payer: Self-pay | Admitting: Physician Assistant

## 2017-12-02 NOTE — Progress Notes (Signed)
FOLLOW UP  Assessment and Plan:   Essential hypertension - continue medications, DASH diet, exercise and monitor at home. Call if greater than 130/80.  -     CBC with Differential/Platelet -     COMPLETE METABOLIC PANEL WITH GFR -     TSH  Chronic obstructive pulmonary disease, unspecified COPD type ([place]) No triggers, well controlled symptoms, cont to monitor  Type 2 diabetes mellitus with diabetic nephropathy, without long-term current use of insulin ([place]) Discussed general issues about diabetes pathophysiology and management., Educational material distributed., Suggested low cholesterol diet., Encouraged aerobic exercise., Discussed foot care., Reminded to get yearly retinal exam. -     Hemoglobin A1c  CKD stage 2 due to type 2 diabetes mellitus ([place]) -     Hemoglobin A1c Discussed disease progression and risks Discussed diet/exercise, weight management and risk modification  Hyperlipidemia check lipids decrease fatty foods increase activity.  -     Lipid panel  Medication management Continue supplement  Overweight (BMI 25.0-29.9)  Overweight  - long discussion about weight loss, diet, and exercise -recommended diet heavy in fruits and veggies and low in animal meats, cheeses, and dairy products  Needs flu shot -     Flu vaccine HIGH DOSE PF  Continue diet and meds as discussed. Further disposition pending results of labs. Discussed med's effects and SE's.   Over 30 minutes of exam, counseling, chart review, and critical decision making was performed Future Appointments  Date Time Provider [place]  02/25/2018  9:00 AM Vicie Mutters, PA-C [facility] None  08/06/2018  9:30 AM Vicie Mutters, PA-C [facility] None     HPI 72 y.o. female  presents for 3 month follow up on hypertension, cholesterol, diabetes and vitamin D deficiency.   She has been having issues with her left shoulder, she had a cortisone shot with Dr. Doran Durand that did not help, did see PT  and she has been taping herself at home.  She is on 1/2 tablet of lorazepam at night, tried melatonin but it did not help.   Her blood pressure has been controlled at home, today their BP is BP: 114/78   She does workout. She denies chest pain, shortness of breath, dizziness.  BMI is Body mass index is 26.89 kg/m., she is working on diet and exercise. Wt Readings from Last 3 Encounters:  12/05/17 166 lb 9.6 oz (75.6 kg)  10/31/17 165 lb (74.8 kg)  07/24/17 166 lb (75.3 kg)    She is on cholesterol medication and denies myalgias. Her cholesterol is at goal. The cholesterol last visit was:   Lab Results  Component Value Date   CHOL 155 07/24/2017   HDL 52 07/24/2017   LDLCALC 86 07/24/2017   TRIG 76 07/24/2017   CHOLHDL 3.0 07/24/2017    She has been working on diet and exercise for Diabetes without complications, she is on bASA, she is on ACE/ARB, and denies paresthesia of the feet, polydipsia, polyuria and visual disturbances. Last A1C was:  Lab Results  Component Value Date   HGBA1C 6.3 (H) 07/24/2017   Lab Results  Component Value Date   GFRAA 97 07/24/2017   Patient is on Vitamin D supplement.   Lab Results  Component Value Date   VD25OH 64 04/11/2017       Current Medications:  Current Outpatient Medications on File Prior to Visit  Medication Sig  . ACCU-CHEK SOFTCLIX LANCETS lancets CHECK BLOOD GLUCOSE 1 TIME EVERY DAY. Dx-R73.03  . acetaminophen (TYLENOL ARTHRITIS PAIN)  650 MG CR tablet Take 1,300 mg by mouth every 8 (eight) hours as needed. For pain  . albuterol (PROVENTIL HFA;VENTOLIN HFA) 108 (90 Base) MCG/ACT inhaler Inhale 2 puffs into the lungs every 6 (six) hours as needed for wheezing or shortness of breath (cough).  Marland Kitchen alendronate (FOSAMAX) 70 MG tablet TAKE 1 TABLET ONCE A WEEK. TAKE WITH A FULL GLASS OF WATER ON AN EMPTY STOMACH.  Marland Kitchen Ascorbic Acid (VITAMIN C PO) Take 500 mg by mouth daily.   Marland Kitchen atorvastatin (LIPITOR) 40 MG tablet TAKE 1 TABLET EVERY DAY   . baclofen (LIORESAL) 10 MG tablet TAKE 1/2 TO 1 TABLET BY MOUTH 2 TO 3 TIMES DAILY AS NEEDED FOR MUSCLE SPASM  . benazepril-hydrochlorthiazide (LOTENSIN HCT) 20-25 MG tablet TAKE 1 TABLET EVERY DAY (Patient taking differently: TAKE 0.5 TABLET BY MOUTH EVERY DAY)  . Blood Glucose Monitoring Suppl (ACCU-CHEK AVIVA PLUS) w/Device KIT USE AS DIRECTED  TO CHECK BLOOD SUGAR ONE TIME DAILY  . brimonidine (ALPHAGAN) 0.2 % ophthalmic solution Place 1 drop into both eyes daily.  . calcium-vitamin D (OSCAL WITH D) 500-200 MG-UNIT tablet Take 1 tablet by mouth daily with breakfast.  . Cholecalciferol (VITAMIN D3) 5000 units TABS Take 5,000 Units by mouth daily.  . diclofenac sodium (VOLTAREN) 1 % GEL APPLY  2  TO  4  GRAMS TOPICALLY FOUR TIMES DAILY  . dorzolamide-timolol (COSOPT) 22.3-6.8 MG/ML ophthalmic solution Place 1 drop into both eyes 2 (two) times daily.  . fluticasone (FLONASE) 50 MCG/ACT nasal spray SHAKE LIQUID AND USE 2 SPRAYS IN EACH NOSTRIL DAILY  . gabapentin (NEURONTIN) 600 MG tablet TAKE 1/2 TO 1 TABLET TWO TO THREE TIMES DAILY FOR PAIN  . glucose blood (ACCU-CHEK AVIVA PLUS) test strip Check blood glucose 1 time daily-DX-R73.03  . latanoprost (XALATAN) 0.005 % ophthalmic solution Place 1 drop into both eyes at bedtime.  Marland Kitchen LORazepam (ATIVAN) 1 MG tablet Take 1/2 to 1 tablet  1 to 2  x / day ONLY if needed for Anxiety Attack  & please try to limit to 5 days /week to avoid addiction  . metFORMIN (GLUCOPHAGE-XR) 500 MG 24 hr tablet TAKE 1 TO 2 TABLETS TWICE DAILY WITH MEALS FOR DIABETES  . Multiple Vitamin (MULTIVITAMIN WITH MINERALS) TABS tablet Take 1 tablet by mouth daily.  . ranitidine (ZANTAC) 300 MG tablet TAKE 1 TABLET AT BEDTIME   No current facility-administered medications on file prior to visit.     Medical History:  Past Medical History:  Diagnosis Date  . Anxiety 07/23/2017  . Asthma   . Bronchitis    hx of;>39yr ago  . Chronic neck pain    arthritis  . COPD (ch[place]  obstructive [place] disease[place]   . Degenerative disc disease    neck  . DeQuervain's disease (tenosynovitis)   . Diverticulitis   . Dizziness    related to neck issues  . Frozen shoulder September 2012  . GERD (gastroesophageal reflux disease)    takes Ranitidine nightly  . Glaucoma   . Hay fever   . Headache(784.0)    related neck issues  . High cholesterol    takes Lipitor daily  . History of shingles 2012  . Hypertension    takes Lotensin HCT daily  . Insomnia    d/t pain and takes Ativan nightly  . Osteoporosis   . Sarcoidosis >358yrago   no problems since  . Type II or unspecified type diabetes mellitus without mention of complication, not stated as  uncontrolled    takes Metformin nightly   Allergies:  Allergies  Allergen Reactions  . Penicillins Hives and Shortness Of Breath  . Aspirin Other (See Comments)    Stomach upset, but low dose is ok  . Hydromorphone Nausea Only    Other reaction(s): Sweating (intolerance)  . Sulfa Antibiotics Other (See Comments)    unknown  . Codeine Rash     Review of Systems:  ROS Seep HPI  Family history- Review and unchanged Social history- Review and unchanged Physical Exam: BP 114/78   Pulse 62   Temp 97.6 F (36.4 C)   Resp 14   Ht 5' 6"  (1.676 m)   Wt 166 lb 9.6 oz (75.6 kg)   SpO2 94%   BMI 26.89 kg/m  Wt Readings from Last 3 Encounters:  12/05/17 166 lb 9.6 oz (75.6 kg)  10/31/17 165 lb (74.8 kg)  07/24/17 166 lb (75.3 kg)   General Appearance: Well nourished, in no apparent distress. Eyes: PERRLA, EOMs, conjunctiva no swelling or erythema Sinuses: No Frontal/maxillary tenderness ENT/Mouth: Ext aud canals clear, TMs without erythema, bulging. No erythema, swelling, or exudate on post pharynx.  Tonsils not swollen or erythematous. Hearing normal.  Neck: Supple, thyroid normal.  Respiratory: Respiratory effort normal, BS equal bilaterally without rales, rhonchi, wheezing or stridor.  Cardio: RRR with  no MRGs. Brisk peripheral pulses without edema.  Abdomen: Soft, + BS.  Non tender, no guarding, rebound, hernias, masses. Lymphatics: Non tender without lymphadenopathy.  Musculoskeletal: Full ROM, 5/5 strength, Normal gait Skin: Warm, dry without rashes, lesions, ecchymosis.  Neuro: Cranial nerves intact. No cerebellar symptoms.  Psych: Awake and oriented X 3, normal affect, Insight and Judgment appropriate.    Vicie Mutters, PA-C 10:27 AM [facility]

## 2017-12-05 ENCOUNTER — Ambulatory Visit (INDEPENDENT_AMBULATORY_CARE_PROVIDER_SITE_OTHER): Payer: Medicare HMO | Admitting: Physician Assistant

## 2017-12-05 ENCOUNTER — Encounter: Payer: Self-pay | Admitting: Physician Assistant

## 2017-12-05 VITALS — BP 114/78 | HR 62 | Temp 97.6°F | Resp 14 | Ht 66.0 in | Wt 166.6 lb

## 2017-12-05 DIAGNOSIS — Z79899 Other long term (current) drug therapy: Secondary | ICD-10-CM | POA: Diagnosis not present

## 2017-12-05 DIAGNOSIS — E7800 Pure hypercholesterolemia, unspecified: Secondary | ICD-10-CM

## 2017-12-05 DIAGNOSIS — E1122 Type 2 diabetes mellitus with diabetic chronic kidney disease: Secondary | ICD-10-CM

## 2017-12-05 DIAGNOSIS — J449 Chronic obstructive pulmonary disease, unspecified: Secondary | ICD-10-CM

## 2017-12-05 DIAGNOSIS — I10 Essential (primary) hypertension: Secondary | ICD-10-CM

## 2017-12-05 DIAGNOSIS — E1121 Type 2 diabetes mellitus with diabetic nephropathy: Secondary | ICD-10-CM | POA: Diagnosis not present

## 2017-12-05 DIAGNOSIS — E663 Overweight: Secondary | ICD-10-CM

## 2017-12-05 DIAGNOSIS — N182 Chronic kidney disease, stage 2 (mild): Secondary | ICD-10-CM

## 2017-12-05 DIAGNOSIS — Z23 Encounter for immunization: Secondary | ICD-10-CM

## 2017-12-05 NOTE — Patient Instructions (Addendum)
Vascular Dementia Dementia is a condition in which a person has problems with thinking, memory, and behavior that are severe enough to interfere with daily life. Vascular dementia is a type of dementia. It results from brain damage that is caused by the brain not getting enough blood. Vascular dementia usually begins between 45 and 70 years of age. What are the causes? Vascular dementia is caused by conditions that lessen blood flow to the brain. Common causes include:  Multiple small strokes. These may happen without symptoms (silent stroke).  Major stroke.  Damage to small blood vessels in the brain (cerebral small vessel disease).  What increases the risk?  Advancing age.  Having had a stroke.  Having high blood pressure (hypertension) or high cholesterol.  Having a disease that affects the heart or blood vessels.  Smoking.  Having diabetes.  Being female.  Being obese.  Not being active.  Having depression. What are the signs or symptoms? Symptoms can vary a lot from one person to another. Symptoms may be mild or severe depending on the amount of damage and which parts of the brain have been affected. Symptoms may begin suddenly or may develop gradually. Symptoms may remain stable, or they may get worse over time. Symptoms of vascular dementia may be similar to those of Alzheimer disease. The two conditions can occur together (mixed dementia). Symptoms of vascular dementia may include: Mental  Confusion.  Memory problems.  Poor attention and concentration.  Trouble understanding speech.  Depression.  Personality changes.  Trouble recognizing familiar people.  Agitation or aggression.  Paranoia.  Delusions or hallucinations. Physical  Weakness.  Poor balance.  Loss of bladder or bowel control (incontinence).  Unsteady walking (gait).  Speaking problems. Behavioral  Getting lost in familiar places.  Problems with planning and  judgment.  Trouble following instructions.  Social problems.  Emotional outbursts.  Trouble with daily activities and self-care.  Problems handling money. How is this diagnosed? There is not a specific test to diagnose vascular dementia. The health care provider will consider the person's medical history and symptoms or changes that are reported by friends and family. The health care provider will do a physical exam and may order lab tests or other tests that check brain and nervous system function. Tests that may be done include:  Blood tests.  Brain imaging tests.  Tests of movement, speech, and other daily activities (neurological exam).  Tests of memory, thinking, and problem-solving (neuropsychological or neurocognitive testing).  Diagnosis may involve several specialists. These may include a health care provider who specializes in the brain and nervous system (neurologist), a provider who specializes in disorders of the mind (psychiatrist), and a provider who focuses on speech and language changes (Electrical engineer). How is this treated? There is no cure for vascular dementia. Brain damage that has already occurred cannot be reversed. Treatment depends on:  How severe the condition is.  Which parts of the brain have been affected.  The person's overall health.  Treatment measures aim to:  Treat the underlying cause of vascular dementia and manage risk factors. This may include: ? Controlling blood pressure. ? Lowering cholesterol. ? Treating diabetes. ? Quitting smoking. ? Losing weight.  Manage symptoms.  Prevent further brain damage.  Improve the person's health and quality of life.  Treatment for dementia may involve a team of health care providers, including:  A neurologist.  A psychiatrist.  An occupational therapist.  A speech pathologist.  A cardiologist.  An exercise physiologist or physical  therapist.  Follow these instructions at  home: Home care for a person with vascular dementia depends on what caused the condition and how severe the symptoms are. General guidelines for care at home include:  Following the health care provider's instructions for treating the condition that caused the dementia.  Using medicines only as told by the person's health care provider.  Creating a safe living space.  Learning ways to help the person remember people, appointments, and daily activities.  Finding a support group to help caregivers and family to cope with the effects of dementia.  Helping family and friends learn about ways to communicate with a person who has dementia.  Making sure the person keeps all follow-up visits and goes to all rehabilitation appointments as told by the health care team. This is important.  Contact a health care provider if:  A fever develops.  New behavioral problems develop.  Problems with swallowing develop.  Confusion gets worse.  Sleepiness gets worse. Get help right away if:  Loss of consciousness occurs.  There is a sudden loss of speech, balance, or thinking ability.  New numbness or paralysis occurs.  Sudden, severe headache occurs.  Vision is lost or suddenly gets worse in one or both eyes. This information is not intended to replace advice given to you by your health care provider. Make sure you discuss any questions you have with your health care provider. Document Released: 02/22/2002 Document Revised: 08/10/2015 Document Reviewed: 06/15/2014 Elsevier Interactive Patient Education  2018 [employer].   [employer] Information Description of Services Cost  A Matter of Balance Class locations vary. Call [place] on Aging for more information.  http://dawson-may.[email or web address]/ [phone] 8-Session program addressing the fear of falling and increasing activity levels of older adults Free to minimal cost  A.C.T. By [employer] [address] [place], [place] [address].  BetaBlues.dk [phone]  Personal training, gym, classes including Silver Sneakers* and ACTion for Aging Adults Fee-based  A.H.O.Y. (Add Health to [place]) Airs on Time [employer] 13, M-F at [place]: [employer],  [facility] [place],  [place], [place], [address] [facility], [place], [place], [place] [address]  [place] Location: Sharrell Ku. [employer] [place]      [phone]  [phone]  [phone]  [phone]  [phone]  [phone]  [phone]  [phone]  [phone]  [phone]    [phone] A total-body conditioning class for adults 15 and older; designed to increase muscular strength, endurance, range of movement, flexibility, balance, agility and coordination Free  [facility] [place], [place] [address] [place]      [address]      [phone]      Pilate's class for individualsreturning to exercise after an injury, before or after surgery or for individuals with complex musculoskeletal issues; designed to improve strength, balance , flexibility      $15/class  [place] [address] [place], [place] [address] [email or web address] [place] classes for beginners to advanced Donation-based  [place], [place]  [address] [email or web address] [place] Chair Exercises Free, ages 20 and older; Ages 31-59 fee based  Marvia Pickles, [facility] [address] [place], [place] [address] Seniorcenter_1 .Beverlee Nims [phone]  A.H.O.Y. Tai Chi Fee-based Donation based or free  [place] Class locations vary.  Call or email Angela Burke or view website for more information. [email or web address] .com GainPain.com.cy.html [phone] Ongoing classes at local YMCAs and gyms Fee-based  Silver Sneakers A.C.T. By [place] [employer]: [place] [facility] [place] [phone]  [phone] Classes designed for older adults who want to improve their strength, flexibility, balance and endurance.   Silver sneakers is covered by some insurance plans and includes a fitness center membership at participating locations. Find out more by calling [phone] or visiting [email or web address] Covered by some insurance plans  [facility] [place] [phone] A.H.O.Y., fitness room, personal training, fitness classes for injury prevention, strength, balance, flexibility, water fitness classes Ages 55+: $19 for 6 months; Ages 29-54: $38 for 6 months  Tai Chi for Everybody [facility] [address] [place], [place] [address] Taichiforeverybody_3 .Patsi Sears [phone] Tai Chi classes for beginners to advanced; geared for seniors Donation Based      [employer] ([employer] Others Participate in Exercise     Loyal Gambler. Rosana Hoes, PhD, [place]  [email or web address] [place]     [phone]     A comprehensive fitness program for adults.  The program paris senior-level undergraduates Kinesiology students with adults who desire to learn how to exercise safely.  Includes a structural exercise class focusing on functional fitnesss     $100/semester in fall and spring; $75 in summer (no trainers)    *Silver Sneakers is covered by some Personal assistant and includes a  Radio producer at participating locations.  Find out more by calling [phone] or visiting [email or web address]  For additional health and human services resources for senior adults, please contact [facility]Line at [phone] in [place] and [place] at [phone] in all other areas.  11 Tips to Follow:  1. No caffeine after 3pm: Avoid beverages with caffeine (soda, tea, energy drinks, etc.) especially after 3pm. 2. Don't go to bed hungry: Have your evening meal at least 3 hrs. before going to sleep. It's fine to have a small bedtime snack such as a glass of milk and a few crackers but don't have a big meal. 3. Have a nightly routine before bed: Plan on "winding down" before you go to sleep. Begin relaxing about 1 hour before you go to bed. Try doing a quiet activity such as listening to calming music, reading a book or meditating. 4. Turn off the TV and ALL electronics including video games, tablets, laptops, etc. 1 hour before sleep, and keep them out of the bedroom. 5. Turn off your cell phone and all notifications (new email and text alerts) or even better, leave your phone outside your room while you sleep. Studies have shown that a part of your brain continues to respond to certain lights and sounds even while you're still asleep. 6. Make your bedroom quiet, dark and cool. If you can't control the noise, try wearing earplugs or  using a fan to block out other sounds. 7. Practice relaxation techniques. Try reading a book or meditating or drain your brain by  writing a list of what you need to do the next day. 8. Don't nap unless you feel sick: you'll have a better night's sleep. 9. Don't smoke, or quit if you do. Nicotine, alcohol, and marijuana can all keep you awake. Talk to your health care provider if you need help with substance use. 10. Most importantly, wake up at the same time every day (or within 1 hour of your usual wake up time) EVEN on the weekends. A regular wake up time promotes sleep hygiene and prevents sleep problems. 11. Reduce exposure to bright light in the last three hours of the day before going to sleep. Maintaining good sleep hygiene and having good sleep habits lower your risk of developing sleep problems. Getting better sleep can also improve your concentration and alertness. Try the simple steps in this guide. If you still have trouble getting enough rest, make an appointment with your health care provider.

## 2017-12-06 LAB — CBC WITH DIFFERENTIAL/PLATELET
BASOS PCT: 0.2 %
Basophils Absolute: 9 cells/uL (ref 0–200)
EOS ABS: 90 {cells}/uL (ref 15–500)
Eosinophils Relative: 2.1 %
HEMATOCRIT: 37.6 % (ref 35.0–45.0)
HEMOGLOBIN: 12.4 g/dL (ref 11.7–15.5)
Lymphs Abs: 1699 cells/uL (ref 850–3900)
MCH: 29.4 pg (ref 27.0–33.0)
MCHC: 33 g/dL (ref 32.0–36.0)
MCV: 89.1 fL (ref 80.0–100.0)
MPV: 10 fL (ref 7.5–12.5)
Monocytes Relative: 8.9 %
Neutro Abs: 2120 cells/uL (ref 1500–7800)
Neutrophils Relative %: 49.3 %
PLATELETS: 266 10*3/uL (ref 140–400)
RBC: 4.22 10*6/uL (ref 3.80–5.10)
RDW: 13.2 % (ref 11.0–15.0)
TOTAL LYMPHOCYTE: 39.5 %
WBC: 4.3 10*3/uL (ref 3.8–10.8)
WBCMIX: 383 {cells}/uL (ref 200–950)

## 2017-12-06 LAB — HEMOGLOBIN A1C
EAG (MMOL/L): 7.7 (calc)
HEMOGLOBIN A1C: 6.5 %{Hb} — AB (ref <5.7)
Mean Plasma Glucose: 140 (calc)

## 2017-12-06 LAB — TSH: TSH: 1.12 mIU/L (ref 0.40–4.50)

## 2017-12-06 LAB — COMPLETE METABOLIC PANEL WITH GFR
AG RATIO: 2.2 (calc) (ref 1.0–2.5)
ALBUMIN MSPROF: 4.7 g/dL (ref 3.6–5.1)
ALT: 22 U/L (ref 6–29)
AST: 19 U/L (ref 10–35)
Alkaline phosphatase (APISO): 93 U/L (ref 33–130)
BUN: 12 mg/dL (ref 7–25)
CO2: 30 mmol/L (ref 20–32)
Calcium: 10 mg/dL (ref 8.6–10.4)
Chloride: 104 mmol/L (ref 98–110)
Creat: 0.83 mg/dL (ref 0.50–0.99)
GFR, EST AFRICAN AMERICAN: 83 mL/min/{1.73_m2} (ref >=60)
GFR, Est Non African American: 72 mL/min/{1.73_m2} (ref >=60)
GLOBULIN: 2.1 g/dL (ref 1.9–3.7)
Glucose, Bld: 103 mg/dL — ABNORMAL HIGH (ref 65–99)
POTASSIUM: 4.4 mmol/L (ref 3.5–5.3)
SODIUM: 139 mmol/L (ref 135–146)
TOTAL PROTEIN: 6.8 g/dL (ref 6.1–8.1)
Total Bilirubin: 0.7 mg/dL (ref 0.2–1.2)

## 2017-12-06 LAB — LIPID PANEL
CHOL/HDL RATIO: 2.9 (calc) (ref <5.0)
Cholesterol: 164 mg/dL (ref <200)
HDL: 57 mg/dL (ref >50)
LDL Cholesterol (Calc): 91 mg/dL (calc)
NON-HDL CHOLESTEROL (CALC): 107 mg/dL (ref <130)
Triglycerides: 72 mg/dL (ref <150)

## 2018-01-12 ENCOUNTER — Encounter: Payer: Self-pay | Admitting: Physician Assistant

## 2018-01-20 ENCOUNTER — Other Ambulatory Visit: Payer: Self-pay | Admitting: Internal Medicine

## 2018-01-21 MED ORDER — PROMETHAZINE-DM 6.25-15 MG/5ML PO SYRP
5.0000 mL | ORAL_SOLUTION | Freq: Four times a day (QID) | ORAL | 1 refills | Status: DC | PRN
Start: 2018-01-21 — End: 2018-04-14

## 2018-02-19 ENCOUNTER — Other Ambulatory Visit: Payer: Self-pay | Admitting: *Deleted

## 2018-02-19 MED ORDER — BENAZEPRIL-HYDROCHLOROTHIAZIDE 20-25 MG PO TABS: 1.0000 | ORAL_TABLET | Freq: Every day | ORAL | 1 refills | Status: DC

## 2018-02-19 MED ORDER — ATORVASTATIN CALCIUM 40 MG PO TABS: 40.0000 mg | ORAL_TABLET | Freq: Every day | ORAL | 1 refills | Status: DC

## 2018-02-19 MED ORDER — FLUTICASONE PROPIONATE 50 MCG/ACT NA SUSP: NASAL | 1 refills | Status: DC

## 2018-02-25 ENCOUNTER — Encounter: Payer: Self-pay | Admitting: Physician Assistant

## 2018-03-11 ENCOUNTER — Other Ambulatory Visit: Payer: Self-pay | Admitting: Internal Medicine

## 2018-03-11 DIAGNOSIS — M47816 Spondylosis without myelopathy or radiculopathy, lumbar region: Secondary | ICD-10-CM

## 2018-03-11 DIAGNOSIS — M533 Sacrococcygeal disorders, not elsewhere classified: Secondary | ICD-10-CM

## 2018-03-11 MED ORDER — BACLOFEN 10 MG PO TABS: ORAL_TABLET | ORAL | 0 refills | Status: DC

## 2018-04-13 NOTE — Progress Notes (Signed)
MEDICARE ANNUAL WELLNESS VISIT AND FOLLOW UP  Assessment:    Encounter for Medicare annual wellness exam 1 YEAR GET MAMMOGRAM  Essential hypertension Continue medications Monitor blood pressure at home; call if consistently over 130/80 Continue DASH diet.   Reminder to go to the ER if any CP, SOB, nausea, dizziness, severe HA, changes vision/speech, left arm numbness and tingling and jaw pain.  Chronic obstructive pulmonary disease, unspecified COPD type ([place]) Well controlled with night time humidifier use Has inhaler to use as needed; hasn't used in a long time  Uncomplicated asthma, unspecified asthma severity, unspecified whether persistent Well controlled without recent exacerbation on PRN albuterol, humidifier at night Avoid triggers  Gastroesophageal reflux disease, esophagitis presence not specified Well managed on current medications Discussed diet, avoiding triggers and other lifestyle changes  Type 2 diabetes mellitus with diabetic nephropathy, without long-term current use of insulin [employer]) Education: Reviewed 'ABCs' of diabetes management (respective goals in parentheses):  A1C (<7), blood pressure (<130/80), and cholesterol (LDL <70) Eye Exam yearly and Dental Exam every 6 months. Dietary recommendations Physical Activity recommendations Foot exam done  Osteoporosis, unspecified osteoporosis type, unspecified pathological fracture presence Has been on fosamax x 4 years; stopped 2018; order dexa for follow up and restart fosamax in 2020 Continue calcium/vitamin D supplementation, weight bearing exercises  Lumbar spondylosis Doing exercises, tylenol Followed by ortho as needed  Cervical arthritis S/p fusion, managing well with therapy, sleeping with specialty pillow and using tylenol Followed by ortho as needed  CKD (chronic kidney disease) stage 2, GFR 60-89 ml/min Increase fluids, avoid NSAIDS, monitor sugars, will monitor  Vitamin D deficiency At goal  at recent check; continue to recommend supplementation for goal of 70-100 Defer vitamin D level  Medication management CBC, CMP/GFR  Hyperlipidemia Currently well above goal of LDL <70; atorvastatin was increased to 40 mg daily Continue low cholesterol diet and exercise.  Check lipid panel.   Glaucoma, unspecified glaucoma type, unspecified laterality Continue eye drops, follow up with ophthalmology  Insomnia Insomnia- good sleep hygiene discussed, increase day time activity, try melatonin or benadryl to limit daily use of lorazepam      Over 30 minutes of exam, counseling, chart review and critical decision making was performed Future Appointments  Date Time Provider [place]  08/06/2018  9:30 AM Vicie Mutters, PA-C [facility] None  04/22/2019 10:00 AM Vicie Mutters, PA-C [facility] None     Plan:   During the course of the visit the patient was educated and counseled about appropriate screening and preventive services including:    Pneumococcal vaccine   Prevnar 13  Influenza vaccine  Td vaccine  Screening electrocardiogram  Bone densitometry screening  Colorectal cancer screening  Diabetes screening  Glaucoma screening  Nutrition counseling   Advanced directives: requested   Subjective:  Sara [name] is a 73 y.o. female who presents for Medicare Annual Wellness Visit and 3 month follow up.   She follows with Dr. Patrice Paradise for neck/back pains- has been using tiger balm and states this is helping a lot.   she has a diagnosis of anxiety and is currently on ativan 0.5-1 mg PRN, reports symptoms are well controlled on current regimen. she reports she currently takes 0.5 mg at night to help with sleep and is doing well.   BMI is Body mass index is 28.39 kg/m., she has been working on diet and exercise. Wt Readings from Last 3 Encounters:  04/14/18 170 lb 9.6 oz (77.4 kg)  12/05/17 166 lb 9.6 oz (  75.6 kg)  10/31/17 165 lb (74.8 kg)    Her  blood pressure has been controlled at home, today their BP is BP: 130/78 She does workout. She denies chest pain, shortness of breath, dizziness.   She is on cholesterol medication (atorvastatin 40 mg daily) and denies myalgias. Her cholesterol is not at goal of less than 70. The cholesterol last visit was:   Lab Results  Component Value Date   CHOL 164 12/05/2017   HDL 57 12/05/2017   LDLCALC 91 12/05/2017   TRIG 72 12/05/2017   CHOLHDL 2.9 12/05/2017    She has been working on diet and exercise for new T2 diabetes, and denies foot ulcerations, increased appetite, nausea, paresthesia of the feet, polydipsia, polyuria, visual disturbances, vomiting and weight loss. She does check fasting values and reports runs about 110. She is on metformin. Last A1C in the office was:  Lab Results  Component Value Date   HGBA1C 6.5 (H) 12/05/2017   Last GFR: Lab Results  Component Value Date   GFRAA 83 12/05/2017   Patient is on Vitamin D supplement and at goal at recent check:   Lab Results  Component Value Date   VD25OH 64 04/11/2017      Medication Review:  Current Outpatient Medications (Endocrine & Metabolic):  .  metFORMIN (GLUCOPHAGE-XR) 500 MG 24 hr tablet, TAKE 1 TO 2 TABLETS TWICE DAILY WITH MEALS FOR DIABETES  Current Outpatient Medications (Cardiovascular):  .  atorvastatin (LIPITOR) 40 MG tablet, Take 1 tablet (40 mg total) by mouth daily. .  benazepril-hydrochlorthiazide (LOTENSIN HCT) 20-25 MG tablet, Take 1 tablet by mouth daily.  Current Outpatient Medications (Respiratory):  .  albuterol (PROVENTIL HFA;VENTOLIN HFA) 108 (90 Base) MCG/ACT inhaler, Inhale 2 puffs into the lungs every 6 (six) hours as needed for wheezing or shortness of breath (cough). .  fluticasone (FLONASE) 50 MCG/ACT nasal spray, SHAKE LIQUID AND USE 2 SPRAYS IN EACH NOSTRIL DAILY  Current Outpatient Medications (Analgesics):  .  acetaminophen (TYLENOL ARTHRITIS PAIN) 650 MG CR tablet, Take 1,300 mg by  mouth every 8 (eight) hours as needed. For pain   Current Outpatient Medications (Other):  Marland Kitchen  ACCU-CHEK SOFTCLIX LANCETS lancets, CHECK BLOOD GLUCOSE 1 TIME EVERY DAY. [id number]-R73.03 .  Ascorbic Acid (VITAMIN C PO), Take 500 mg by mouth daily.  .  baclofen (LIORESAL) 10 MG tablet, Take 1/2 to 1 tablet 2 to 3 x /day ONLY if needed for muscle spasm .  Blood Glucose Monitoring Suppl (ACCU-CHEK AVIVA PLUS) w/Device KIT, USE AS DIRECTED  TO CHECK BLOOD SUGAR ONE TIME DAILY .  brimonidine (ALPHAGAN) 0.2 % ophthalmic solution, Place 1 drop into both eyes daily. .  calcium-vitamin D (OSCAL WITH D) 500-200 MG-UNIT tablet, Take 1 tablet by mouth daily with breakfast. .  Cholecalciferol (VITAMIN D3) 5000 units TABS, Take 5,000 Units by mouth daily. .  diclofenac sodium (VOLTAREN) 1 % GEL, APPLY  2  TO  4  GRAMS TOPICALLY FOUR TIMES DAILY .  dorzolamide-timolol (COSOPT) 22.3-6.8 MG/ML ophthalmic solution, Place 1 drop into both eyes 2 (two) times daily. Marland Kitchen  gabapentin (NEURONTIN) 600 MG tablet, TAKE 1/2 TO 1 TABLET TWO TO THREE TIMES DAILY FOR PAIN .  glucose blood (ACCU-CHEK AVIVA PLUS) test strip, Check blood glucose 1 time daily-DX-R73.03 .  latanoprost (XALATAN) 0.005 % ophthalmic solution, Place 1 drop into both eyes at bedtime. Marland Kitchen  LORazepam (ATIVAN) 1 MG tablet, TAKE 1/2 TO 1 TABLET SPARINGLY TWO TO THREE TIMES DAILY  ONLY  IF  NEEDED FOR ANXIETY .  Multiple Vitamin (MULTIVITAMIN WITH MINERALS) TABS tablet, Take 1 tablet by mouth daily. .  ranitidine (ZANTAC) 300 MG tablet, TAKE 1 TABLET AT BEDTIME   Allergies  Allergen Reactions  . Penicillins Hives and Shortness Of Breath  . Aspirin Other (See Comments)    Stomach upset, but low dose is ok  . Hydromorphone Nausea Only    Other reaction(s): Sweating (intolerance)  . Sulfa Antibiotics Other (See Comments)    unknown  . Codeine Rash    Current Problems (verified) Patient Active Problem List   Diagnosis Date Noted  . Insomnia 07/23/2017  . S/P  cervical spinal fusion 07/22/2017  . Osteoporosis 04/14/2017  . Overweight (BMI 25.0-29.9) 01/04/2015  . CKD stage 2 due to type 2 diabetes mellitus ([place]) 11/16/2013  . Vitamin D deficiency 06/04/2013  . Medication management 06/04/2013  . Type II diabetes mellitus with renal manifestations ([place])   . GERD (gastroesophageal reflux disease)   . Glaucoma   . Hyperlipidemia   . Hypertension   . COPD (chronic obstructive pulmonary disease) ([place])   . Asthma   . Cervical arthritis   . Lumbar spondylosis 03/27/2012  . Disorder of sacroiliac joint 05/31/2011    Screening Tests Immunization History  Administered Date(s) Administered  . Influenza, High Dose Seasonal PF 01/04/2015, 12/31/2016, 12/05/2017  . Influenza-Unspecified 03/01/2016  . Pneumococcal Conjugate-13 11/16/2013  . Pneumococcal Polysaccharide-23 04/12/2015  . Pneumococcal-Unspecified 03/18/2005  . Tdap 03/18/2009  . Zoster 11/13/2012   Preventative care: Last colonoscopy: 10/2017 MGM 01/2017  Dexa 01/2017 -has been on fosamax started 2014, off 2018 MRI neck 2017 CXR 2015 CT AB 2011 diverticulitis  Prior vaccinations: TD or Tdap: 2011  Influenza: 2018 Pneumococcal: 2017 Prevnar13: 2015 Shingles/Zostavax: 2014  Names of Other Physician/Practitioners you currently use: 1. [facility] here for primary care 2. [facility] Dr. Lenna Gilford,  last visit 07/25/2017 - abstracted 3. [facility]. Andria Frames at [facility] for glaucoma.  4. [facility], Pharmacist, community, last visit 2019  Patient Care Team: Unk Pinto, MD as PCP - General (Internal Medicine) Normajean Glasgow, MD as Attending Physician (Physical Medicine and Rehabilitation) Phylliss Bob, MD as Consulting Physician (Orthopedic Surgery) Juanita Craver, MD as Consulting Physician (Gastroenterology)  SURGICAL HISTORY She  has a past surgical history that includes Carpal tunnel release (2012); Wrist surgery; Breast surgery (20+yrs ago);  Tonsillectomy (30+yrs ago); Abdominal hysterectomy (1975); Refractive surgery (2011); Colonoscopy; Anterior cervical decomp/discectomy fusion (07/31/2011); Colonoscopy with propofol (N/A, 10/31/2017); and biopsy (10/31/2017). FAMILY HISTORY Her family history includes Cancer (age of onset: 48) in her mother; Diabetes in her sister; Glaucoma in her father; HIV in her son; Hypertension in her sister; Leukemia in her father; Mitral valve prolapse in her sister; Pneumonia in her father; Pulmonary embolism in her sister. SOCIAL HISTORY She  reports that she has never smoked. She has never used smokeless tobacco. She reports that she does not drink alcohol or use drugs.   MEDICARE WELLNESS OBJECTIVES: Physical activity: Current Exercise Habits: The patient does not participate in regular exercise at present(wants to start back on the bike) Cardiac risk factors: Cardiac Risk Factors include: advanced age (>41mn, >>87women);dyslipidemia;diabetes mellitus;hypertension;sedentary lifestyle Depression/mood screen:   [facility] 04/14/2018  Decreased Interest 0  Down, Depressed, Hopeless 0  PHQ - 2 Score 0    ADLs:  In your present state of health, do you have any difficulty performing the following activities: 04/14/2018 07/24/2017  Hearing? N N  Vision? N N  Difficulty concentrating or making decisions? N N  Walking or climbing stairs? N N  Dressing or bathing? N N  Doing errands, shopping? N N  Preparing Food and eating ? N -  Using the Toilet? N -  In the past six months, have you accidently leaked urine? Y -  Comment will have incontinence occ -  Do you have problems with loss of bowel control? N -  Managing your Medications? N -  Managing your Finances? N -  Housekeeping or managing your Housekeeping? N -  Some recent data might be hidden     Cognitive Testing  Alert? Yes  Normal Appearance?Yes  Oriented to person? Yes  Place? Yes   Time? Yes  Recall of three objects?   Yes  Can perform simple calculations? Yes  Displays appropriate judgment?Yes  Can read the correct time from a watch face?Yes  EOL planning: Does Patient Have a Medical Advance Directive?: Yes Type of Advance Directive: Healthcare Power of Attorney, Living will Does patient want to make changes to medical advance directive?: No - Patient declined  Review of Systems  Constitutional: Negative for malaise/fatigue and weight loss.  HENT: Negative for hearing loss and tinnitus.   Eyes: Negative for blurred vision and double vision.  Respiratory: Negative for cough, sputum production, shortness of breath and wheezing.   Cardiovascular: Negative for chest pain, palpitations, orthopnea, claudication, leg swelling and PND.  Gastrointestinal: Negative for abdominal pain, blood in stool, constipation, diarrhea, heartburn, melena, nausea and vomiting.  Genitourinary: Negative.   Musculoskeletal: Negative for falls, joint pain and myalgias.  Skin: Negative for rash.  Neurological: Negative for dizziness, tingling, sensory change, weakness and headaches.  Endo/Heme/Allergies: Negative for polydipsia.  Psychiatric/Behavioral: Negative.  Negative for depression, memory loss, substance abuse and suicidal ideas. The patient is not nervous/anxious and does not have insomnia.   All other systems reviewed and are negative.    Objective:     Today's Vitals   04/14/18 1054  BP: 130/78  Pulse: 100  Temp: (!) 97.4 F (36.3 C)  SpO2: 97%  Weight: 170 lb 9.6 oz (77.4 kg)  Height: 5' 5"  (1.651 m)   Body mass index is 28.39 kg/m.  General appearance: alert, no distress, WD/WN, female HEENT: normocephalic, sclerae anicteric, TMs pearly, nares patent, no discharge or erythema, pharynx normal Oral cavity: MMM, no lesions Neck: supple, no lymphadenopathy, no thyromegaly, no masses Heart: RRR, normal S1, S2, no murmurs Lungs: CTA bilaterally, no wheezes, rhonchi, or rales Abdomen: +bs, soft, non tender,  non distended, no masses, no hepatomegaly, no splenomegaly Musculoskeletal: nontender, no swelling, no obvious deformity Extremities: no edema, no cyanosis, no clubbing Pulses: 2+ symmetric, upper and lower extremities, normal cap refill Neurological: alert, oriented x 3, CN2-12 intact, strength normal upper extremities and lower extremities, sensation normal throughout, DTRs 2+ throughout, no cerebellar signs, gait normal Psychiatric: normal affect, behavior normal, pleasant   Medicare Attestation I have personally reviewed: The patient's medical and social history Their use of alcohol, tobacco or illicit drugs Their current medications and supplements The patient's functional ability including ADLs,fall risks, home safety risks, cognitive, and hearing and visual impairment Diet and physical activities Evidence for depression or mood disorders  The patient's weight, height, BMI, and visual acuity have been recorded in the chart.  I have made referrals, counseling, and provided education to the patient based on review of the above and I have provided the patient with a written personalized care plan for preventive  services.     Vicie Mutters, PA-C   04/14/2018

## 2018-04-14 ENCOUNTER — Encounter: Payer: Self-pay | Admitting: Physician Assistant

## 2018-04-14 ENCOUNTER — Ambulatory Visit (INDEPENDENT_AMBULATORY_CARE_PROVIDER_SITE_OTHER): Payer: Medicare Other | Admitting: Physician Assistant

## 2018-04-14 VITALS — BP 130/78 | HR 100 | Temp 97.4°F | Ht 65.0 in | Wt 170.6 lb

## 2018-04-14 DIAGNOSIS — Z0001 Encounter for general adult medical examination with abnormal findings: Secondary | ICD-10-CM | POA: Diagnosis not present

## 2018-04-14 DIAGNOSIS — E1122 Type 2 diabetes mellitus with diabetic chronic kidney disease: Secondary | ICD-10-CM

## 2018-04-14 DIAGNOSIS — E663 Overweight: Secondary | ICD-10-CM

## 2018-04-14 DIAGNOSIS — Z79899 Other long term (current) drug therapy: Secondary | ICD-10-CM | POA: Diagnosis not present

## 2018-04-14 DIAGNOSIS — E1121 Type 2 diabetes mellitus with diabetic nephropathy: Secondary | ICD-10-CM

## 2018-04-14 DIAGNOSIS — N182 Chronic kidney disease, stage 2 (mild): Secondary | ICD-10-CM | POA: Diagnosis not present

## 2018-04-14 DIAGNOSIS — E7800 Pure hypercholesterolemia, unspecified: Secondary | ICD-10-CM | POA: Diagnosis not present

## 2018-04-14 DIAGNOSIS — E559 Vitamin D deficiency, unspecified: Secondary | ICD-10-CM

## 2018-04-14 DIAGNOSIS — J449 Chronic obstructive pulmonary disease, unspecified: Secondary | ICD-10-CM

## 2018-04-14 DIAGNOSIS — J45909 Unspecified asthma, uncomplicated: Secondary | ICD-10-CM

## 2018-04-14 DIAGNOSIS — M810 Age-related osteoporosis without current pathological fracture: Secondary | ICD-10-CM

## 2018-04-14 DIAGNOSIS — R6889 Other general symptoms and signs: Secondary | ICD-10-CM | POA: Diagnosis not present

## 2018-04-14 DIAGNOSIS — I10 Essential (primary) hypertension: Secondary | ICD-10-CM | POA: Diagnosis not present

## 2018-04-14 DIAGNOSIS — K219 Gastro-esophageal reflux disease without esophagitis: Secondary | ICD-10-CM

## 2018-04-14 NOTE — Patient Instructions (Addendum)
HOW TO SCHEDULE A MAMMOGRAM  [facility]  7 a.m.-6:30 p.m., Monday 7 a.m.-5 p.m., Tuesday-Friday Schedule an appointment by calling [phone].  GET MAMMOGRAM IN NOVEMBER AND WILL GET DEXA THEN TOO     When it comes to diets, agreement about the perfect plan isn't easy to find, even among the experts. Experts at the [facility] developed an idea known as the Healthy Eating Plate. Just imagine a plate divided into logical, healthy portions.  The emphasis is on diet quality:  Load up on vegetables and fruits - one-half of your plate: Aim for color and variety, and remember that potatoes don't count.  Go for whole grains - one-quarter of your plate: Whole wheat, barley, wheat berries, quinoa, oats, brown rice, and foods made with them. If you want pasta, go with whole wheat pasta.  Protein power - one-quarter of your plate: Fish, chicken, beans, and nuts are all healthy, versatile protein sources. Limit red meat.  The diet, however, does go beyond the plate, offering a few other suggestions.  Use healthy plant oils, such as olive, canola, soy, corn, sunflower and peanut. Check the labels, and avoid partially hydrogenated oil, which have unhealthy trans fats.  If you're thirsty, drink water. Coffee and tea are good in moderation, but skip sugary drinks and limit milk and dairy products to one or two daily servings.  The type of carbohydrate in the diet is more important than the amount. Some sources of carbohydrates, such as vegetables, fruits, whole grains, and beans-are healthier than others.  Finally, stay active.  WOMEN AND HEART ATTACKS  Being a woman you may not have the typical symptoms of a heart attack.  You may not have any pain OR you may have atypical pain such as jaw pain, upper back pain, arm pain, "my bra feels to tight" and you will often have symptoms with it like below.  Symptoms for a heart attack will likely  occur when you exert your self or exercise and include: Shortness of breath Sweating Nausea Dizziness Fast or irregular heart beats Fatigue   It makes me feel better if my patients get their heart rate up with exercise once or twice a week and pay close attention to your body. If there is ANY change in your exercise capacity or if you have symptoms above, please STOP and call 911 or call to come to the office.   Here is some information to help you keep your heart healthy: Move it! - Aim for 30 mins of activity every day. Take it slowly at first. Talk to [place] before starting any new exercise program.   Lose it.  -Body Mass Index (BMI) can indicate if you need to lose weight. A healthy range is 18.5-24.9. For a BMI calculator, go to [email or web address]  Waist Management -Excess abdominal fat is a risk factor for heart disease, diabetes, asthma, stroke and more. Ideal waist circumference is less than 35" for women and less than 40" for men.   Eat Right -focus on fruits, vegetables, whole grains, and meals you make yourself. Avoid foods with trans fat and high sugar/sodium content.   Snooze or Snore? - Loud snoring can be a sign of sleep apnea, a significant risk factor for high blood pressure, heart attach, stroke, and heart arrhythmias.  Kick the habit -Quit Smoking! Avoid second hand smoke. A single cigarette raises your blood pressure for 20 mins and increases the risk of heart attack and  stroke for the next 24 hours.   Are Aspirin and Supplements right for you? -Add ENTERIC COATED low dose 81 mg Aspirin daily OR can do every other day if you have easy bruising to protect your heart and head. As well as to reduce risk of Colon Cancer by 20 %, Skin Cancer by 26 % , Melanoma by 46% and Pancreatic cancer by 60%  Say "No to Stress -There may be little you can do about problems that cause stress. However, techniques such as long walks, meditation, and exercise can help you manage it.   Start  Now! - Make changes one at a time and set reasonable goals to increase your likelihood of success.

## 2018-04-15 LAB — CBC WITH DIFFERENTIAL/PLATELET
ABSOLUTE MONOCYTES: 343 {cells}/uL (ref 200–950)
BASOS PCT: 0.8 %
Basophils Absolute: 31 cells/uL (ref 0–200)
EOS ABS: 82 {cells}/uL (ref 15–500)
Eosinophils Relative: 2.1 %
HCT: 38.7 % (ref 35.0–45.0)
HEMOGLOBIN: 12.9 g/dL (ref 11.7–15.5)
Lymphs Abs: 1700 cells/uL (ref 850–3900)
MCH: 29.9 pg (ref 27.0–33.0)
MCHC: 33.3 g/dL (ref 32.0–36.0)
MCV: 89.6 fL (ref 80.0–100.0)
MPV: 10.1 fL (ref 7.5–12.5)
Monocytes Relative: 8.8 %
NEUTROS ABS: 1743 {cells}/uL (ref 1500–7800)
Neutrophils Relative %: 44.7 %
PLATELETS: 286 10*3/uL (ref 140–400)
RBC: 4.32 10*6/uL (ref 3.80–5.10)
RDW: 12.8 % (ref 11.0–15.0)
Total Lymphocyte: 43.6 %
WBC: 3.9 10*3/uL (ref 3.8–10.8)

## 2018-04-15 LAB — LIPID PANEL
Cholesterol: 140 mg/dL (ref <200)
HDL: 49 mg/dL — ABNORMAL LOW (ref >50)
LDL CHOLESTEROL (CALC): 75 mg/dL
Non-HDL Cholesterol (Calc): 91 mg/dL (calc) (ref <130)
Total CHOL/HDL Ratio: 2.9 (calc) (ref <5.0)
Triglycerides: 84 mg/dL (ref <150)

## 2018-04-15 LAB — COMPLETE METABOLIC PANEL WITH GFR
AG RATIO: 1.9 (calc) (ref 1.0–2.5)
ALKALINE PHOSPHATASE (APISO): 52 U/L (ref 33–130)
ALT: 15 U/L (ref 6–29)
AST: 17 U/L (ref 10–35)
Albumin: 4.6 g/dL (ref 3.6–5.1)
BUN: 13 mg/dL (ref 7–25)
CO2: 28 mmol/L (ref 20–32)
Calcium: 10 mg/dL (ref 8.6–10.4)
Chloride: 104 mmol/L (ref 98–110)
Creat: 0.78 mg/dL (ref 0.60–0.93)
GFR, Est African American: 89 mL/min/{1.73_m2} (ref >=60)
GFR, Est Non African American: 77 mL/min/{1.73_m2} (ref >=60)
GLOBULIN: 2.4 g/dL (ref 1.9–3.7)
Glucose, Bld: 106 mg/dL — ABNORMAL HIGH (ref 65–99)
POTASSIUM: 4.2 mmol/L (ref 3.5–5.3)
SODIUM: 138 mmol/L (ref 135–146)
Total Bilirubin: 0.5 mg/dL (ref 0.2–1.2)
Total Protein: 7 g/dL (ref 6.1–8.1)

## 2018-04-15 LAB — HEMOGLOBIN A1C
Hgb A1c MFr Bld: 6.4 % of total Hgb — ABNORMAL HIGH (ref <5.7)
Mean Plasma Glucose: 137 (calc)
eAG (mmol/L): 7.6 (calc)

## 2018-04-15 LAB — MAGNESIUM: Magnesium: 2 mg/dL (ref 1.5–2.5)

## 2018-04-15 LAB — TSH: TSH: 1.63 mIU/L (ref 0.40–4.50)

## 2018-04-29 NOTE — Progress Notes (Signed)
[place]

## 2018-05-13 ENCOUNTER — Encounter (INDEPENDENT_AMBULATORY_CARE_PROVIDER_SITE_OTHER): Payer: Self-pay | Admitting: Orthopaedic Surgery

## 2018-05-13 ENCOUNTER — Ambulatory Visit (INDEPENDENT_AMBULATORY_CARE_PROVIDER_SITE_OTHER): Payer: Self-pay

## 2018-05-13 ENCOUNTER — Ambulatory Visit (INDEPENDENT_AMBULATORY_CARE_PROVIDER_SITE_OTHER): Payer: Medicare Other | Admitting: Orthopaedic Surgery

## 2018-05-13 VITALS — BP 96/71 | HR 83 | Ht 65.0 in | Wt 170.6 lb

## 2018-05-13 DIAGNOSIS — M25562 Pain in left knee: Secondary | ICD-10-CM | POA: Diagnosis not present

## 2018-05-13 DIAGNOSIS — M25561 Pain in right knee: Secondary | ICD-10-CM

## 2018-05-13 DIAGNOSIS — M170 Bilateral primary osteoarthritis of knee: Secondary | ICD-10-CM | POA: Diagnosis not present

## 2018-05-13 MED ORDER — LIDOCAINE HCL 1 % IJ SOLN
0.5000 mL | INTRAMUSCULAR | Status: AC | PRN
  Administered 2018-05-13: .5 mL

## 2018-05-13 MED ORDER — BUPIVACAINE HCL 0.5 % IJ SOLN
3.0000 mL | INTRAMUSCULAR | Status: AC | PRN
  Administered 2018-05-13: 3 mL via INTRA_ARTICULAR

## 2018-05-13 MED ORDER — METHYLPREDNISOLONE ACETATE 40 MG/ML IJ SUSP
40.0000 mg | INTRAMUSCULAR | Status: AC | PRN
  Administered 2018-05-13: 40 mg via INTRA_ARTICULAR
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
## 2018-05-26 ENCOUNTER — Other Ambulatory Visit: Payer: Self-pay | Admitting: Internal Medicine

## 2018-05-26 ENCOUNTER — Other Ambulatory Visit: Payer: Self-pay | Admitting: *Deleted

## 2018-05-26 MED ORDER — GLUCOSE BLOOD VI STRP: ORAL_STRIP | 1 refills | Status: AC

## 2018-05-26 MED ORDER — LORAZEPAM 1 MG PO TABS: ORAL_TABLET | ORAL | 0 refills | Status: DC

## 2018-05-26 MED ORDER — ACCU-CHEK SOFTCLIX LANCETS MISC: 1 refills | Status: DC

## 2018-05-26 MED ORDER — METFORMIN HCL ER 500 MG PO TB24: ORAL_TABLET | ORAL | 1 refills | Status: DC

## 2018-05-26 MED ORDER — PROMETHAZINE-DM 6.25-15 MG/5ML PO SYRP: ORAL_SOLUTION | ORAL | 1 refills | Status: DC

## 2018-05-26 MED ORDER — ACCU-CHEK AVIVA PLUS W/DEVICE KIT: PACK | 0 refills | Status: AC

## 2018-05-28 ENCOUNTER — Other Ambulatory Visit: Payer: Self-pay | Admitting: *Deleted

## 2018-05-28 MED ORDER — ACCU-CHEK SOFTCLIX LANCETS MISC: 1 refills | Status: AC

## 2018-06-24 ENCOUNTER — Ambulatory Visit (INDEPENDENT_AMBULATORY_CARE_PROVIDER_SITE_OTHER): Payer: Medicare Other | Admitting: Orthopaedic Surgery

## 2018-07-29 ENCOUNTER — Ambulatory Visit (INDEPENDENT_AMBULATORY_CARE_PROVIDER_SITE_OTHER): Payer: Medicare Other | Admitting: Internal Medicine

## 2018-07-29 ENCOUNTER — Encounter: Payer: Self-pay | Admitting: Internal Medicine

## 2018-07-29 ENCOUNTER — Other Ambulatory Visit: Payer: Self-pay

## 2018-07-29 VITALS — BP 108/68 | HR 72 | Temp 97.6°F | Resp 18 | Ht 65.0 in | Wt 179.4 lb

## 2018-07-29 DIAGNOSIS — Z79899 Other long term (current) drug therapy: Secondary | ICD-10-CM

## 2018-07-29 DIAGNOSIS — E559 Vitamin D deficiency, unspecified: Secondary | ICD-10-CM | POA: Diagnosis not present

## 2018-07-29 DIAGNOSIS — Z8249 Family history of ischemic heart disease and other diseases of the circulatory system: Secondary | ICD-10-CM

## 2018-07-29 DIAGNOSIS — E782 Mixed hyperlipidemia: Secondary | ICD-10-CM

## 2018-07-29 DIAGNOSIS — E1122 Type 2 diabetes mellitus with diabetic chronic kidney disease: Secondary | ICD-10-CM

## 2018-07-29 DIAGNOSIS — Z1211 Encounter for screening for malignant neoplasm of colon: Secondary | ICD-10-CM

## 2018-07-29 DIAGNOSIS — Z0001 Encounter for general adult medical examination with abnormal findings: Secondary | ICD-10-CM

## 2018-07-29 DIAGNOSIS — Z136 Encounter for screening for cardiovascular disorders: Secondary | ICD-10-CM | POA: Diagnosis not present

## 2018-07-29 DIAGNOSIS — N182 Chronic kidney disease, stage 2 (mild): Secondary | ICD-10-CM

## 2018-07-29 DIAGNOSIS — Z0000 Encounter for general adult medical examination without abnormal findings: Secondary | ICD-10-CM | POA: Diagnosis not present

## 2018-07-29 DIAGNOSIS — K219 Gastro-esophageal reflux disease without esophagitis: Secondary | ICD-10-CM

## 2018-07-29 DIAGNOSIS — I10 Essential (primary) hypertension: Secondary | ICD-10-CM | POA: Diagnosis not present

## 2018-07-29 MED ORDER — FAMOTIDINE 40 MG PO TABS: ORAL_TABLET | ORAL | 3 refills | Status: DC

## 2018-07-29 NOTE — Patient Instructions (Signed)
>>>>>>>>>>>>>>>>>>>>>>>>>>>>>>>>>>>>>>>>>>>>>>>>>>>>>>> Coronavirus (COVID-19) Are you at risk?  Are you at risk for the Coronavirus (COVID-19)?  To be considered HIGH RISK for Coronavirus (COVID-19), you have to meet the following criteria:  . Traveled to [place], [place], [place], [place] or [place]; or in the [place] to [place], [place], [place]  . or [place]; and have fever, cough, and shortness of breath within the last 2 weeks of travel OR . Been in close contact with a person diagnosed with COVID-19 within the last 2 weeks and have  . fever, cough,and shortness of breath .  . IF YOU DO NOT MEET THESE CRITERIA, YOU ARE CONSIDERED LOW RISK FOR COVID-19.  What to do if you are HIGH RISK for COVID-19?  Marland Kitchen If you are having a medical emergency, call 911. . Seek medical care right away. Before you go to a doctor's office, urgent care or emergency department, .  call ahead and tell them about your recent travel, contact with someone diagnosed with COVID-19  .  and your symptoms.  . You should receive instructions from your physician's office regarding next steps of care.  . When you arrive at healthcare provider, tell the healthcare staff immediately you have returned from  . visiting [place], [place], [place], [place] or [place]; or traveled in the [place] to [place], [place],  . [place] or [place] in the last two weeks or you have been in close contact with a person diagnosed with  . COVID-19 in the last 2 weeks.   . Tell the health care staff about your symptoms: fever, cough and shortness of breath. . After you have been seen by a medical provider, you will be either: o Tested for (COVID-19) and discharged home on quarantine except to seek medical care if  o symptoms worsen, and asked to  - Stay home and avoid contact with others until you get your results (4-5 days)  - Avoid travel on public transportation if possible (such as bus, train, or airplane)  or o Sent to the Emergency Department by EMS for evaluation, COVID-19 testing  and  o possible admission depending on your condition and test results.  What to do if you are LOW RISK for COVID-19?  Reduce your risk of any infection by using the same precautions used for avoiding the common cold or flu:  Marland Kitchen Wash your hands often with soap and warm water for at least 20 seconds.  If soap and water are not readily available,  . use an alcohol-based hand sanitizer with at least 60% alcohol.  . If coughing or sneezing, cover your mouth and nose by coughing or sneezing into the elbow areas of your shirt or coat, .  into a tissue or into your sleeve (not your hands). . Avoid shaking hands with others and consider head nods or verbal greetings only. . Avoid touching your eyes, nose, or mouth with unwashed hands.  . Avoid close contact with people who are sick. . Avoid places or events with large numbers of people in one location, like concerts or sporting events. . Carefully consider travel plans you have or are making. . If you are planning any travel outside or inside the [place], visit the CDC's Travelers' Health webpage for the latest health notices. . If you have some symptoms but not all symptoms, continue to monitor at home and seek medical attention  . if your symptoms worsen. . If you are having a medical emergency, call 911.   . >>>>>>>>>>>>>>>>>>>>>>>>>>>>>>>>> .  We Do NOT Approve of  [facility], [employer]  Our Patients  To Do Home Visits & We Do NOT Approve of [employer] > > > > > > > > > > > > > > > > > > > > > > > > > > > > > > > > > > > > > > >  Preventive Care for Adults  A healthy lifestyle and preventive care can promote health and wellness. Preventive health guidelines for women include the following key practices.  A routine yearly physical is a good way to check with your health care provider about your health and preventive screening. It is a  chance to share any concerns and updates on your health and to receive a thorough exam.  Visit your dentist for a routine exam and preventive care every 6 months. Brush your teeth twice a day and floss once a day. Good oral hygiene prevents tooth decay and gum disease.  The frequency of eye exams is based on your age, health, family medical history, use of contact lenses, and other factors. Follow your health care provider's recommendations for frequency of eye exams.  Eat a healthy diet. Foods like vegetables, fruits, whole grains, low-fat dairy products, and lean protein foods contain the nutrients you need without too many calories. Decrease your intake of foods high in solid fats, added sugars, and salt. Eat the right amount of calories for you. Get information about a proper diet from your health care provider, if necessary.  Regular physical exercise is one of the most important things you can do for your health. Most adults should get at least 150 minutes of moderate-intensity exercise (any activity that increases your heart rate and causes you to sweat) each week. In addition, most adults need muscle-strengthening exercises on 2 or more days a week.  Maintain a healthy weight. The body mass index (BMI) is a screening tool to identify possible weight problems. It provides an estimate of body fat based on height and weight. Your health care provider can find your BMI and can help you achieve or maintain a healthy weight. For adults 20 years and older:  A BMI below 18.5 is considered underweight.  A BMI of 18.5 to 24.9 is normal.  A BMI of 25 to 29.9 is considered overweight.  A BMI of 30 and above is considered obese.  Maintain normal blood lipids and cholesterol levels by exercising and minimizing your intake of saturated fat. Eat a balanced diet with plenty of fruit and vegetables. If your lipid or cholesterol levels are high, you are over 50, or you are at high risk for heart disease,  you may need your cholesterol levels checked more frequently. Ongoing high lipid and cholesterol levels should be treated with medicines if diet and exercise are not working.  If you smoke, find out from your health care provider how to quit. If you do not use tobacco, do not start.  Lung cancer screening is recommended for adults aged 60-80 years who are at high risk for developing lung cancer because of a history of smoking. A yearly low-dose CT scan of the lungs is recommended for people who have at least a 30-pack-year history of smoking and are a current smoker or have quit within the past 15 years. A pack year of smoking is smoking an average of 1 pack of cigarettes a day for 1 year (for example: 1 pack a day for 30 years or 2 packs a  day for 15 years). Yearly screening should continue until the smoker has stopped smoking for at least 15 years. Yearly screening should be stopped for people who develop a health problem that would prevent them from having lung cancer treatment.  Avoid use of street drugs. Do not share needles with anyone. Ask for help if you need support or instructions about stopping the use of drugs.  High blood pressure causes heart disease and increases the risk of stroke.  Ongoing high blood pressure should be treated with medicines if weight loss and exercise do not work.  If you are 16-48 years old, ask your health care provider if you should take aspirin to prevent strokes.  Diabetes screening involves taking a blood sample to check your fasting blood sugar level. This should be done once every 3 years, after age 8, if you are within normal weight and without risk factors for diabetes. Testing should be considered at a younger age or be carried out more frequently if you are overweight and have at least 1 risk factor for diabetes.  Breast cancer screening is essential preventive care for women. You should practice "breast self-awareness." This means understanding the  normal appearance and feel of your breasts and may include breast self-examination. Any changes detected, no matter how small, should be reported to a health care provider. Women in their 28s and 30s should have a clinical breast exam (CBE) by a health care provider as part of a regular health exam every 1 to 3 years. After age 45, women should have a CBE every year. Starting at age 38, women should consider having a mammogram (breast X-ray test) every year. Women who have a family history of breast cancer should talk to their health care provider about genetic screening. Women at a high risk of breast cancer should talk to their health care providers about having an MRI and a mammogram every year.  Breast cancer gene (BRCA)-related cancer risk assessment is recommended for women who have family members with BRCA-related cancers. BRCA-related cancers include breast, ovarian, tubal, and peritoneal cancers. Having family members with these cancers may be associated with an increased risk for harmful changes (mutations) in the breast cancer genes BRCA1 and BRCA2. Results of the assessment will determine the need for genetic counseling and BRCA1 and BRCA2 testing.  Routine pelvic exams to screen for cancer are no longer recommended for nonpregnant women who are considered low risk for cancer of the pelvic organs (ovaries, uterus, and vagina) and who do not have symptoms. Ask your health care provider if a screening pelvic exam is right for you.  If you have had past treatment for cervical cancer or a condition that could lead to cancer, you need Pap tests and screening for cancer for at least 20 years after your treatment. If Pap tests have been discontinued, your risk factors (such as having a new sexual partner) need to be reassessed to determine if screening should be resumed. Some women have medical problems that increase the chance of getting cervical cancer. In these cases, your health care provider may  recommend more frequent screening and Pap tests.    Colorectal cancer can be detected and often prevented. Most routine colorectal cancer screening begins at the age of 69 years and continues through age 55 years. However, your health care provider may recommend screening at an earlier age if you have risk factors for colon cancer. On a yearly basis, your health care provider may provide home test kits to  check for hidden blood in the stool. Use of a small camera at the end of a tube, to directly examine the colon (sigmoidoscopy or colonoscopy), can detect the earliest forms of colorectal cancer. Talk to your health care provider about this at age 50, when routine screening begins.  Direct exam of the colon should be repeated every 5-10 years through age 75 years, unless early forms of pre-cancerous polyps or small growths are found.  Osteoporosis is a disease in which the bones lose minerals and strength with aging. This can result in serious bone fractures or breaks. The risk of osteoporosis can be identified using a bone density scan. Women ages 65 years and over and women at risk for fractures or osteoporosis should discuss screening with their health care providers. Ask your health care provider whether you should take a calcium supplement or vitamin D to reduce the rate of osteoporosis.  Menopause can be associated with physical symptoms and risks. Hormone replacement therapy is available to decrease symptoms and risks. You should talk to your health care provider about whether hormone replacement therapy is right for you.  Use sunscreen. Apply sunscreen liberally and repeatedly throughout the day. You should seek shade when your shadow is shorter than you. Protect yourself by wearing long sleeves, pants, a wide-brimmed hat, and sunglasses year round, whenever you are outdoors.  Once a month, do a whole body skin exam, using a mirror to look at the skin on your back. Tell your health care provider  of new moles, moles that have irregular borders, moles that are larger than a pencil eraser, or moles that have changed in shape or color.  Stay current with required vaccines (immunizations).  Influenza vaccine. All adults should be immunized every year.  Tetanus, diphtheria, and acellular pertussis (Td, Tdap) vaccine. Pregnant women should receive 1 dose of Tdap vaccine during each pregnancy. The dose should be obtained regardless of the length of time since the last dose. Immunization is preferred during the 27th-36th week of gestation. An adult who has not previously received Tdap or who does not know her vaccine status should receive 1 dose of Tdap. This initial dose should be followed by tetanus and diphtheria toxoids (Td) booster doses every 10 years. Adults with an unknown or incomplete history of completing a 3-dose immunization series with Td-containing vaccines should begin or complete a primary immunization series including a Tdap dose. Adults should receive a Td booster every 10 years.    Zoster vaccine. One dose is recommended for adults aged 60 years or older unless certain conditions are present.    Pneumococcal 13-valent conjugate (PCV13) vaccine. When indicated, a person who is uncertain of her immunization history and has no record of immunization should receive the PCV13 vaccine. An adult aged 19 years or older who has certain medical conditions and has not been previously immunized should receive 1 dose of PCV13 vaccine. This PCV13 should be followed with a dose of pneumococcal polysaccharide (PPSV23) vaccine. The PPSV23 vaccine dose should be obtained at least 1 or more year(s) after the dose of PCV13 vaccine. An adult aged 19 years or older who has certain medical conditions and previously received 1 or more doses of PPSV23 vaccine should receive 1 dose of PCV13. The PCV13 vaccine dose should be obtained 1 or more years after the last PPSV23 vaccine dose.    Pneumococcal  polysaccharide (PPSV23) vaccine. When PCV13 is also indicated, PCV13 should be obtained first. All adults aged 65 years and older   should be immunized. An adult younger than age 58 years who has certain medical conditions should be immunized. Any person who resides in a nursing home or long-term care facility should be immunized. An adult smoker should be immunized. People with an immunocompromised condition and certain other conditions should receive both PCV13 and PPSV23 vaccines. People with human immunodeficiency virus (HIV) infection should be immunized as soon as possible after diagnosis. Immunization during chemotherapy or radiation therapy should be avoided. Routine use of PPSV23 vaccine is not recommended for American Indians, [place] Natives, or people younger than 65 years unless there are medical conditions that require PPSV23 vaccine. When indicated, people who have unknown immunization and have no record of immunization should receive PPSV23 vaccine. One-time revaccination 5 years after the first dose of PPSV23 is recommended for people aged 19-64 years who have chronic kidney failure, nephrotic syndrome, asplenia, or immunocompromised conditions. People who received 1-2 doses of PPSV23 before age 82 years should receive another dose of PPSV23 vaccine at age 2 years or later if at least 5 years have passed since the previous dose. Doses of PPSV23 are not needed for people immunized with PPSV23 at or after age 9 years.   Preventive Services / Frequency  Ages 40 years and over  Blood pressure check.  Lipid and cholesterol check.  Lung cancer screening. / Every year if you are aged 49-80 years and have a 30-pack-year history of smoking and currently smoke or have quit within the past 15 years. Yearly screening is stopped once you have quit smoking for at least 15 years or develop a health problem that would prevent you from having lung cancer treatment.  Clinical breast exam.** / Every year  after age 53 years.   BRCA-related cancer risk assessment.** / For women who have family members with a BRCA-related cancer (breast, ovarian, tubal, or peritoneal cancers).  Mammogram.** / Every year beginning at age 56 years and continuing for as long as you are in good health. Consult with your health care provider.  Pap test.** / Every 3 years starting at age 73 years through age 8 or 48 years with 3 consecutive normal Pap tests. Testing can be stopped between 65 and 70 years with 3 consecutive normal Pap tests and no abnormal Pap or HPV tests in the past 10 years.  Fecal occult blood test (FOBT) of stool. / Every year beginning at age 58 years and continuing until age 53 years. You may not need to do this test if you get a colonoscopy every 10 years.  Flexible sigmoidoscopy or colonoscopy.** / Every 5 years for a flexible sigmoidoscopy or every 10 years for a colonoscopy beginning at age 35 years and continuing until age 78 years.  Hepatitis C blood test.** / For all people born from [birth year] through 1963 and any individual with known risks for hepatitis C.  Osteoporosis screening.** / A one-time screening for women ages 54 years and over and women at risk for fractures or osteoporosis.  Skin self-exam. / Monthly.  Influenza vaccine. / Every year.  Tetanus, diphtheria, and acellular pertussis (Tdap/Td) vaccine.** / 1 dose of Td every 10 years.  Zoster vaccine.** / 1 dose for adults aged 48 years or older.  Pneumococcal 13-valent conjugate (PCV13) vaccine.** / Consult your health care provider.  Pneumococcal polysaccharide (PPSV23) vaccine.** / 1 dose for all adults aged 14 years and older. Screening for abdominal aortic aneurysm (AAA)  by ultrasound is recommended for people who have history of high blood pressure  or who are current or former smokers. ++++++++++++++++++++ Recommend Adult Low Dose Aspirin or  coated  Aspirin 81 mg daily  To reduce risk of Colon Cancer 20 %,  Skin  Cancer 26 % ,  Melanoma 46%  and  Pancreatic cancer 60% ++++++++++++++++++++ Vitamin D goal  is between 70-100.  Please make sure that you are taking your Vitamin D as directed.  It is very important as a natural anti-inflammatory  helping hair, skin, and nails, as well as reducing stroke and heart attack risk.  It helps your bones and helps with mood. It also decreases numerous cancer risks so please take it as directed.  Low Vit D is associated with a 200-300% higher risk for CANCER  and 200-300% higher risk for HEART   ATTACK  &  STROKE.   .....................................Marland Kitchen It is also associated with higher death rate at younger ages,  autoimmune diseases like Rheumatoid arthritis, Lupus, Multiple Sclerosis.    Also many other serious conditions, like depression, Alzheimer's Dementia, infertility, muscle aches, fatigue, fibromyalgia - just to name a few. ++++++++++++++++++ Recommend the book "The END of DIETING" by Dr Excell Seltzer  & the book "The END of DIABETES " by Dr Excell Seltzer At [email or web address] - get book & Audio CD's    Being diabetic has a  300% increased risk for heart attack, stroke, cancer, and alzheimer- type vascular dementia. It is very important that you work harder with diet by avoiding all foods that are white. Avoid white rice (brown & wild rice is OK), white potatoes (sweetpotatoes in moderation is OK), White bread or wheat bread or anything made out of white flour like bagels, donuts, rolls, buns, biscuits, cakes, pastries, cookies, pizza crust, and pasta (made from white flour & egg whites) - vegetarian pasta or spinach or wheat pasta is OK. Multigrain breads like Arnold's or Pepperidge [employer], or multigrain sandwich thins or flatbreads.  Diet, exercise and weight loss can reverse and cure diabetes in the early stages.  Diet, exercise and weight loss is very important in the control and prevention of complications of diabetes which affects every system in your body, ie.  Brain - dementia/stroke, eyes - glaucoma/blindness, heart - heart attack/heart failure, kidneys - dialysis, stomach - gastric paralysis, intestines - malabsorption, nerves - severe painful neuritis, circulation - gangrene & loss of a leg(s), and finally cancer and Alzheimers.    I recommend avoid fried & greasy foods,  sweets/candy, white rice (brown or wild rice or Quinoa is OK), white potatoes (sweet potatoes are OK) - anything made from white flour - bagels, doughnuts, rolls, buns, biscuits,white and wheat breads, pizza crust and traditional pasta made of white flour & egg white(vegetarian pasta or spinach or wheat pasta is OK).  Multi-grain bread is OK - like multi-grain flat bread or sandwich thins. Avoid alcohol in excess. Exercise is also important.    Eat all the vegetables you want - avoid meat, especially red meat and dairy - especially cheese.  Cheese is the most concentrated form of trans-fats which is the worst thing to clog up our arteries. Veggie cheese is OK which can be found in the fresh produce section at [employer] or [employer] or [employer]  +++++++++++++++++++ DASH Eating Plan  DASH stands for "Dietary Approaches to Stop Hypertension."   The DASH eating plan is a healthy eating plan that has been shown to reduce high blood pressure (hypertension). Additional health benefits may include reducing the risk of type 2 diabetes mellitus, heart  disease, and stroke. The DASH eating plan may also help with weight loss. WHAT DO I NEED TO KNOW ABOUT THE DASH EATING PLAN? For the DASH eating plan, you will follow these general guidelines:  Choose foods with a percent daily value for sodium of less than 5% (as listed on the food label).  Use salt-free seasonings or herbs instead of table salt or sea salt.  Check with your health care provider or pharmacist before using salt substitutes.  Eat lower-sodium products, often labeled as "lower sodium" or "no salt added."  Eat fresh  foods.  Eat more vegetables, fruits, and low-fat dairy products.  Choose whole grains. Look for the word "whole" as the first word in the ingredient list.  Choose fish   Limit sweets, desserts, sugars, and sugary drinks.  Choose heart-healthy fats.  Eat veggie cheese   Eat more home-cooked food and less restaurant, buffet, and fast food.  Limit fried foods.  Cook foods using methods other than frying.  Limit canned vegetables. If you do use them, rinse them well to decrease the sodium.  When eating at a restaurant, ask that your food be prepared with less salt, or no salt if possible.                      WHAT FOODS CAN I EAT? Read Dr Fara Olden Fuhrman's books on The End of Dieting & The End of Diabetes  Grains Whole grain or whole wheat bread. Brown rice. Whole grain or whole wheat pasta. Quinoa, bulgur, and whole grain cereals. Low-sodium cereals. Corn or whole wheat flour tortillas. Whole grain cornbread. Whole grain crackers. Low-sodium crackers.  Vegetables Fresh or frozen vegetables (raw, steamed, roasted, or grilled). Low-sodium or reduced-sodium tomato and vegetable juices. Low-sodium or reduced-sodium tomato sauce and paste. Low-sodium or reduced-sodium canned vegetables.   Fruits All fresh, canned (in natural juice), or frozen fruits.  Protein Products  All fish and seafood.  Dried beans, peas, or lentils. Unsalted nuts and seeds. Unsalted canned beans.  Dairy Low-fat dairy products, such as skim or 1% milk, 2% or reduced-fat cheeses, low-fat ricotta or cottage cheese, or plain low-fat yogurt. Low-sodium or reduced-sodium cheeses.  Fats and Oils Tub margarines without trans fats. Light or reduced-fat mayonnaise and salad dressings (reduced sodium). Avocado. Safflower, olive, or canola oils. Natural peanut or almond butter.  Other Unsalted popcorn and pretzels. The items listed above may not be a complete list of recommended foods or beverages. Contact your  dietitian for more options.  +++++++++++++++  WHAT FOODS ARE NOT RECOMMENDED? Grains/ White flour or wheat flour White bread. White pasta. White rice. Refined cornbread. Bagels and croissants. Crackers that contain trans fat.  Vegetables  Creamed or fried vegetables. Vegetables in a . Regular canned vegetables. Regular canned tomato sauce and paste. Regular tomato and vegetable juices.  Fruits Dried fruits. Canned fruit in light or heavy syrup. Fruit juice.  Meat and Other Protein Products Meat in general - RED meat & White meat.  Fatty cuts of meat. Ribs, chicken wings, all processed meats as bacon, sausage, bologna, salami, fatback, hot dogs, bratwurst and packaged luncheon meats.  Dairy Whole or 2% milk, cream, half-and-half, and cream cheese. Whole-fat or sweetened yogurt. Full-fat cheeses or blue cheese. Non-dairy creamers and whipped toppings. Processed cheese, cheese spreads, or cheese curds.  Condiments Onion and garlic salt, seasoned salt, table salt, and sea salt. Canned and packaged gravies. Worcestershire sauce. Tartar sauce. Barbecue sauce. Teriyaki sauce. Soy sauce, including  reduced sodium. Steak sauce. Fish sauce. Oyster sauce. Cocktail sauce. Horseradish. Ketchup and mustard. Meat flavorings and tenderizers. Bouillon cubes. Hot sauce. Tabasco sauce. Marinades. Taco seasonings. Relishes.  Fats and Oils Butter, stick margarine, lard, shortening and bacon fat. Coconut, palm kernel, or palm oils. Regular salad dressings.  Pickles and olives. Salted popcorn and pretzels.  The items listed above may not be a complete list of foods and beverages to avoid.    

## 2018-07-29 NOTE — Progress Notes (Signed)
[facility] Unk Pinto, M.D.     Uvaldo Bristle. Silverio Lay, P.A.-C Liane Comber, [place] [address] [place] [address] Telephone [phone] Telefax [phone] Annual Screening/Preventative Visit & Comprehensive Evaluation &  Examination  History of Present Illness:     This very nice 73 y.o. single BF  presents for a Screening /Preventative Visit & comprehensive evaluation and management of multiple medical co-morbidities.  Patient has been followed for HTN, HLD, Prediabetes  and Vitamin D Deficiency. Also , she has GERD controlled on her meds.       HTN predates since 2003. Patient's BP has been controlled at home and patient denies any cardiac symptoms as chest pain, palpitations, shortness of breath, dizziness or ankle swelling. Today's BP is at goal - 108/68.     Patient's hyperlipidemia is controlled with diet and Atorvastatin. Patient denies myalgias or other medication SE's. Last lipids were at goal: Lab Results  Component Value Date   CHOL 140 04/14/2018   HDL 49 (L) 04/14/2018   LDLCALC 75 04/14/2018   TRIG 84 04/14/2018   CHOLHDL 2.9 04/14/2018      Patient has hx/o T2_NIDDM since 2011 & has CKD2 and patient denies reactive hypoglycemic symptoms, visual blurring, diabetic polys or paresthesias. Last A1c was not at goal: Lab Results  Component Value Date   HGBA1C 6.4 (H) 04/14/2018      Finally, patient has history of Vitamin D Deficiency and last Vitamin D was at goal: Lab Results  Component Value Date   VD25OH 64 04/11/2017   Current Outpatient Medications on File Prior to Visit  Medication Sig  . Accu-Chek Softclix Lancets lancets CHECK BLOOD GLUCOSE 1 TIME EVERY DAY. Dx-R73.03  . acetaminophen (TYLENOL ARTHRITIS PAIN) 650 MG CR tablet Take 1,300 mg by mouth every 8 (eight) hours as needed. For pain  . albuterol (PROVENTIL HFA;VENTOLIN HFA) 108 (90 Base) MCG/ACT inhaler Inhale  2 puffs into the lungs every 6 (six) hours as needed for wheezing or shortness of breath (cough).  . Ascorbic Acid (VITAMIN C PO) Take 500 mg by mouth daily.   Marland Kitchen atorvastatin (LIPITOR) 40 MG tablet Take 1 tablet (40 mg total) by mouth daily.  . baclofen (LIORESAL) 10 MG tablet Take 1/2 to 1 tablet 2 to 3 x /day ONLY if needed for muscle spasm  . benazepril-hydrochlorthiazide (LOTENSIN HCT) 20-25 MG tablet Take 1 tablet by mouth daily.  . Blood Glucose Monitoring Suppl (ACCU-CHEK AVIVA PLUS) w/Device KIT USE AS DIRECTED  TO CHECK BLOOD SUGAR ONE TIME DAILY.DX-R73.03  . brimonidine (ALPHAGAN) 0.2 % ophthalmic solution Place 1 drop into both eyes daily.  . calcium-vitamin D (OSCAL WITH D) 500-200 MG-UNIT tablet Take 1 tablet by mouth daily with breakfast.  . Cholecalciferol (VITAMIN D3) 5000 units TABS Take 5,000 Units by mouth daily.  . diclofenac sodium (VOLTAREN) 1 % GEL APPLY  2  TO  4  GRAMS TOPICALLY FOUR TIMES DAILY  . dorzolamide-timolol (COSOPT) 22.3-6.8 MG/ML ophthalmic solution Place 1 drop into both eyes 2 (two) times daily.  . fluticasone (FLONASE) 50 MCG/ACT nasal spray SHAKE LIQUID AND USE 2 SPRAYS IN EACH NOSTRIL DAILY  . gabapentin (NEURONTIN) 600 MG tablet TAKE 1/2 TO 1 TABLET TWO TO THREE TIMES DAILY FOR PAIN  . glucose blood (ACCU-CHEK AVIVA PLUS) test strip Check blood glucose 1 time daily-DX-R73.03  . latanoprost (XALATAN) 0.005 % ophthalmic solution Place 1 drop into both eyes at bedtime.  Marland Kitchen LORazepam (ATIVAN)  1 MG tablet Take 1/2-1 tablet 2 - 3 x /day ONLY if needed for Anxiety Attack &  limit to 5 days /week to avoid addiction  . metFORMIN (GLUCOPHAGE-XR) 500 MG 24 hr tablet Take 2 tablets 2 x /day with meal for Diabetes  . Multiple Vitamin (MULTIVITAMIN WITH MINERALS) TABS tablet Take 1 tablet by mouth daily.  . ranitidine (ZANTAC) 300 MG tablet TAKE 1 TABLET AT BEDTIME   No current facility-administered medications on file prior to visit.    Allergies  Allergen  Reactions  . Penicillins Hives and Shortness Of Breath  . Aspirin Other (See Comments)    Stomach upset, but low dose is ok  . Hydromorphone Nausea Only    Other reaction(s): Sweating (intolerance)  . Sulfa Antibiotics Other (See Comments)    unknown  . Codeine Rash   Past Medical History:  Diagnosis Date  . Anxiety 07/23/2017  . Asthma   . Bronchitis    hx of;>52yr ago  . Chronic neck pain    arthritis  . COPD [place]   . Degenerative disc disease    neck  . DeQuervain's disease (tenosynovitis)   . Diverticulitis   . Dizziness    related to neck issues  . Frozen shoulder September 2012  . GERD (gastroesophageal reflux disease)    takes Ranitidine nightly  . Glaucoma   . Hay fever   . Headache(784.0)    related neck issues  . High cholesterol    takes Lipitor daily  . History of shingles 2012  . Hypertension    takes Lotensin HCT daily  . Insomnia    d/t pain and takes Ativan nightly  . Osteoporosis   . Sarcoidosis >322yrago   no problems since  . Type II or unspecified type diabetes mellitus without mention of complication, not stated as uncontrolled    takes Metformin nightly   Health Maintenance  Topic Date Due  . OPHTHALMOLOGY EXAM  07/26/2018  . HEMOGLOBIN A1C  10/13/2018  . INFLUENZA VACCINE  10/17/2018  . MAMMOGRAM  02/11/2019  . TETANUS/TDAP  03/19/2019  . FOOT EXAM  04/15/2019  . COLONOSCOPY  11/01/2027  . DEXA SCAN  Completed  . Hepatitis C Screening  Completed  . PNA vac Low Risk Adult  Completed   Immunization History  Administered Date(s) Administered  . Influenza, High Dose Seasonal PF 01/04/2015, 12/31/2016, 12/05/2017  . Influenza-Unspecified 03/01/2016  . Pneumococcal Conjugate-13 11/16/2013  . Pneumococcal Polysaccharide-23 04/12/2015  . Pneumococcal-Unspecified 03/18/2005  . Tdap 03/18/2009  . Zoster 11/13/2012   Last Colon - 08.16.2019 - Dr ChAnnye Englishecc 5 yr f/u due Aug 2024  Last MGM -  02/10/2017 / SoTeola BradleyPast Surgical History:  Procedure Laterality Date  . ABDOMINAL HYSTERECTOMY  1975  . ANTERIOR CERVICAL DECOMP/DISCECTOMY FUSION  07/31/2011   Procedure: ANTERIOR CERVICAL DECOMPRESSION/DISCECTOMY FUSION 3 LEVELS;  Surgeon: RoHosie SpangleMD;  [facility];  Service: Neurosurgery;  Laterality: N/A;  Cervical four-five,Cervical five-six,Cervical six-seven anterior cervical decompression with fusion plating and bonegraft  . BIOPSY  10/31/2017   Procedure: BIOPSY;  Surgeon: WhIleana RoupMD;  [facility];  Service: General;;  . BREAST SURGERY  20+yrs ago   left breast;benign  . CARPAL TUNNEL RELEASE  2012  . COLONOSCOPY    . COLONOSCOPY WITH PROPOFOL N/A 10/31/2017   Procedure: COLONOSCOPY SCREENING;  Surgeon: WhIleana RoupMD;  Location: [facility];  Service: General;  Laterality: N/A;  .  REFRACTIVE SURGERY  2011   bilateral  . TONSILLECTOMY  30+yrs ago  . WRIST SURGERY     ORIF left wrist and then removed d/t protruding screw in 2012   Family History  Problem Relation Age of Onset  . Cancer Mother 3       breast cancer  . Pneumonia Father   . Leukemia Father   . Glaucoma Father   . Mitral valve prolapse Sister   . Hypertension Sister   . Pulmonary embolism Sister   . Diabetes Sister   . HIV Son   . Anesthesia problems Neg Hx   . Hypotension Neg Hx   . Malignant hyperthermia Neg Hx   . Pseudochol deficiency Neg Hx    Social History   Tobacco Use  . Smoking status: Never Smoker  . Smokeless tobacco: Never Used  Substance Use Topics  . Alcohol use: No  . Drug use: No    ROS Constitutional: Denies fever, chills, weight loss/gain, headaches, insomnia,  night sweats, and change in appetite. Does c/o fatigue. Eyes: Denies redness, blurred vision, diplopia, discharge, itchy, watery eyes.  ENT: Denies discharge, congestion, post nasal drip, epistaxis, sore throat, earache, hearing loss, dental pain, Tinnitus, Vertigo,  Sinus pain, snoring.  Cardio: Denies chest pain, palpitations, irregular heartbeat, syncope, dyspnea, diaphoresis, orthopnea, PND, claudication, edema Respiratory: denies cough, dyspnea, DOE, pleurisy, hoarseness, laryngitis, wheezing.  Gastrointestinal: Denies dysphagia, heartburn, reflux, water brash, pain, cramps, nausea, vomiting, bloating, diarrhea, constipation, hematemesis, melena, hematochezia, jaundice, hemorrhoids Genitourinary: Denies dysuria, frequency, urgency, nocturia, hesitancy, discharge, hematuria, flank pain Breast: Breast lumps, nipple discharge, bleeding.  Musculoskeletal: Denies arthralgia, myalgia, stiffness, Jt. Swelling, pain, limp, and strain/sprain. Denies falls. Skin: Denies puritis, rash, hives, warts, acne, eczema, changing in skin lesion Neuro: No weakness, tremor, incoordination, spasms, paresthesia, pain Psychiatric: Denies confusion, memory loss, sensory loss. Denies Depression. Endocrine: Denies change in weight, skin, hair change, nocturia, and paresthesia, diabetic polys, visual blurring, hyper / hypo glycemic episodes.  Heme/Lymph: No excessive bleeding, bruising, enlarged lymph nodes.  Physical Exam  BP 108/68   Pulse 72   Temp 97.6 F (36.4 C)   Resp 18   Ht '5\' 5"'$  (1.651 m)   Wt 179 lb 6.4 oz (81.4 kg)   BMI 29.85 kg/m   General Appearance: Over nourished, well groomed and in no apparent distress.  Eyes: PERRLA, EOMs, conjunctiva no swelling or erythema, normal fundi and vessels. Sinuses: No frontal/maxillary tenderness ENT/Mouth: EACs patent / TMs  nl. Nares clear without erythema, swelling, mucoid exudates. Oral hygiene is good. No erythema, swelling, or exudate. Tongue normal, non-obstructing. Tonsils not swollen or erythematous. Hearing normal.  Neck: Supple, thyroid not palpable. No bruits, nodes or JVD. Respiratory: Respiratory effort normal.  BS equal and clear bilateral without rales, rhonci, wheezing or stridor. Cardio: Heart sounds are  normal with regular rate and rhythm and no murmurs, rubs or gallops. Peripheral pulses are normal and equal bilaterally without edema. No aortic or femoral bruits. Chest: symmetric with normal excursions and percussion. Breasts: Symmetric, without lumps, nipple discharge, retractions, or fibrocystic changes.  Abdomen: Flat, soft with bowel sounds active. Nontender, no guarding, rebound, hernias, masses, or organomegaly.  Lymphatics: Non tender without lymphadenopathy.  Musculoskeletal: Full ROM all peripheral extremities, joint stability, 5/5 strength, and normal gait. Skin: Warm and dry without rashes, lesions, cyanosis, clubbing or  ecchymosis.  Neuro: Cranial nerves intact, reflexes equal bilaterally. Normal muscle tone, no cerebellar symptoms. Sensation intact to touch, vibratory and Monofilament to the toes bilaterally. Pysch:  Alert and oriented X 3, normal affect, Insight and Judgment appropriate.   Assessment and Plan  1. Annual Preventative Screening Examination  2. Essential hypertension  - EKG 12-Lead - Urinalysis, Routine w reflex microscopic - Microalbumin / creatinine urine ratio - CBC with Differential/Platelet - COMPLETE METABOLIC PANEL WITH GFR - Magnesium - TSH  3. Hyperlipidemia, mixed  - EKG 12-Lead - Lipid panel - TSH  4. Type 2 diabetes mellitus with stage 2 chronic kidney disease, without long-term current use of insulin (HCC)  - EKG 12-Lead - Urinalysis, Routine w reflex microscopic - Microalbumin / creatinine urine ratio - HM DIABETES FOOT EXAM - LOW EXTREMITY NEUR EXAM DOCUM - Hemoglobin A1c - Insulin, random  5. Vitamin D deficiency  - VITAMIN D 25 Hydroxy  6. Gastroesophageal reflux disease,   - CBC with Differential/Platelet  7. Screening for colorectal cancer  - POC Hemoccult Bld/Stl  8. Screening for ischemic heart disease  - EKG 12-Lead  9. FH: hypertension  - EKG 12-Lead  10. Medication management  - Urinalysis, Routine w  reflex microscopic - Microalbumin / creatinine urine ratio - CBC with Differential/Platelet - COMPLETE METABOLIC PANEL WITH GFR - Magnesium - Lipid panel - TSH - Hemoglobin A1c - VITAMIN D 25 Hydroxyl      Patient was counseled in prudent diet to achieve/maintain BMI less than 25 for weight control, BP monitoring, regular exercise and medications. Discussed med's effects and SE's. Screening labs and tests as requested with regular follow-up as recommended. I discussed the assessment and treatment plan as above with the patient. The patient was provided an opportunity to ask questions and all were answered. The patient agreed with the plan and demonstrated an understanding of the instructions. Over 40 minutes of exam, counseling, chart review and high complex critical decision making was performed.   Kirtland Bouchard, MD

## 2018-07-30 LAB — TSH: TSH: 1.81 mIU/L (ref 0.40–4.50)

## 2018-07-30 LAB — LIPID PANEL
Cholesterol: 198 mg/dL (ref <200)
HDL: 59 mg/dL (ref >=50)
LDL Cholesterol (Calc): 117 mg/dL (calc) — ABNORMAL HIGH
Non-HDL Cholesterol (Calc): 139 mg/dL (calc) — ABNORMAL HIGH (ref <130)
Total CHOL/HDL Ratio: 3.4 (calc) (ref <5.0)
Triglycerides: 113 mg/dL (ref <150)

## 2018-07-30 LAB — COMPLETE METABOLIC PANEL WITH GFR
AG Ratio: 1.6 (calc) (ref 1.0–2.5)
ALT: 22 U/L (ref 6–29)
AST: 23 U/L (ref 10–35)
Albumin: 4.6 g/dL (ref 3.6–5.1)
Alkaline phosphatase (APISO): 62 U/L (ref 37–153)
BUN: 15 mg/dL (ref 7–25)
CO2: 28 mmol/L (ref 20–32)
Calcium: 10.2 mg/dL (ref 8.6–10.4)
Chloride: 99 mmol/L (ref 98–110)
Creat: 0.86 mg/dL (ref 0.60–0.93)
GFR, Est African American: 79 mL/min/{1.73_m2} (ref >=60)
GFR, Est Non African American: 68 mL/min/{1.73_m2} (ref >=60)
Globulin: 2.9 g/dL (calc) (ref 1.9–3.7)
Glucose, Bld: 110 mg/dL — ABNORMAL HIGH (ref 65–99)
Potassium: 4.4 mmol/L (ref 3.5–5.3)
Sodium: 137 mmol/L (ref 135–146)
Total Bilirubin: 0.6 mg/dL (ref 0.2–1.2)
Total Protein: 7.5 g/dL (ref 6.1–8.1)

## 2018-07-30 LAB — URINALYSIS, ROUTINE W REFLEX MICROSCOPIC
Bacteria, UA: NONE SEEN /HPF
Bilirubin Urine: NEGATIVE
Glucose, UA: NEGATIVE
Hgb urine dipstick: NEGATIVE
Hyaline Cast: NONE SEEN /LPF
Ketones, ur: NEGATIVE
Nitrite: NEGATIVE
Protein, ur: NEGATIVE
RBC / HPF: NONE SEEN /HPF (ref 0–2)
Specific Gravity, Urine: 1.004 (ref 1.001–1.03)
Squamous Epithelial / HPF: NONE SEEN /HPF (ref <=5)
pH: 7 (ref 5.0–8.0)

## 2018-07-30 LAB — CBC WITH DIFFERENTIAL/PLATELET
Absolute Monocytes: 327 cells/uL (ref 200–950)
Basophils Absolute: 38 cells/uL (ref 0–200)
Basophils Relative: 1 %
Eosinophils Absolute: 68 cells/uL (ref 15–500)
Eosinophils Relative: 1.8 %
HCT: 40.4 % (ref 35.0–45.0)
Hemoglobin: 13.4 g/dL (ref 11.7–15.5)
Lymphs Abs: 1512 cells/uL (ref 850–3900)
MCH: 29.8 pg (ref 27.0–33.0)
MCHC: 33.2 g/dL (ref 32.0–36.0)
MCV: 90 fL (ref 80.0–100.0)
MPV: 9.9 fL (ref 7.5–12.5)
Monocytes Relative: 8.6 %
Neutro Abs: 1854 cells/uL (ref 1500–7800)
Neutrophils Relative %: 48.8 %
Platelets: 268 10*3/uL (ref 140–400)
RBC: 4.49 10*6/uL (ref 3.80–5.10)
RDW: 13.1 % (ref 11.0–15.0)
Total Lymphocyte: 39.8 %
WBC: 3.8 10*3/uL (ref 3.8–10.8)

## 2018-07-30 LAB — HEMOGLOBIN A1C
Hgb A1c MFr Bld: 6.6 % of total Hgb — ABNORMAL HIGH (ref <5.7)
Mean Plasma Glucose: 143 (calc)
eAG (mmol/L): 7.9 (calc)

## 2018-07-30 LAB — VITAMIN D 25 HYDROXY (VIT D DEFICIENCY, FRACTURES): Vit D, 25-Hydroxy: 56 ng/mL (ref 30–100)

## 2018-07-30 LAB — MICROALBUMIN / CREATININE URINE RATIO
Creatinine, Urine: 25 mg/dL (ref 20–275)
Microalb Creat Ratio: 16 mcg/mg creat (ref <30)
Microalb, Ur: 0.4 mg/dL

## 2018-07-30 LAB — MAGNESIUM: Magnesium: 2.2 mg/dL (ref 1.5–2.5)

## 2018-07-30 LAB — INSULIN, RANDOM: Insulin: 3.4 u[IU]/mL

## 2018-07-31 ENCOUNTER — Encounter: Payer: Self-pay | Admitting: Internal Medicine

## 2018-08-06 ENCOUNTER — Ambulatory Visit: Payer: Self-pay | Admitting: Physician Assistant

## 2018-08-21 DIAGNOSIS — E119 Type 2 diabetes mellitus without complications: Secondary | ICD-10-CM | POA: Diagnosis not present

## 2018-08-21 LAB — HM DIABETES EYE EXAM

## 2018-10-06 ENCOUNTER — Other Ambulatory Visit: Payer: Self-pay | Admitting: Internal Medicine

## 2018-10-06 DIAGNOSIS — Z1231 Encounter for screening mammogram for malignant neoplasm of breast: Secondary | ICD-10-CM

## 2018-11-09 NOTE — Progress Notes (Signed)
FOLLOW UP  Assessment and Plan:   Essential hypertension - continue medications, DASH diet, exercise and monitor at home. Call if greater than 130/80.  -     CBC with Differential/Platelet -     COMPLETE METABOLIC PANEL WITH GFR -     TSH  Type 2 diabetes mellitus with diabetic nephropathy, without long-term current use of insulin ([place]) Discussed general issues about diabetes pathophysiology and management., Educational material distributed., Suggested low cholesterol diet., Encouraged aerobic exercise., Discussed foot care., Reminded to get yearly retinal exam. -     Hemoglobin A1c  CKD stage 2 due to type 2 diabetes mellitus ([place]) -     CMP/GFR Discussed disease progression and risks Discussed diet/exercise, weight management and risk modification  Hyperlipidemia check lipids decrease fatty foods increase activity.  -     Lipid panel  Medication management Continue supplement  Overweight (BMI 25.0-29.9) - long discussion about weight loss, diet, and exercise -recommended diet heavy in fruits and veggies and low in animal meats, cheeses, and dairy products  Vitamin D deficiency At goal at recent check; continue to recommend supplementation for goal of 60-100 Defer vitamin D level  Insomnia - good sleep hygiene discussed, increase day time activity, try benadryl if this does not help we will call in sleep medication.   Asthma/COPD Cost is currently a burden with rescue inhaler; she will call insurance RE cost of preferred inhalers and message back Consider switch to nebulized if no reasonable options Given 4 week sample of breo hold and start if needed Will trial addition of singulair after discussion; appears allergies significant trigger Discussed goal is no coughing/wheezing in evening She will message back in 1 month with progress  GERD ? Breakthrough sx; sent in famotidine to initiate in the evening Discussed diet, avoiding triggers and other lifestyle  changes   Continue diet and meds as discussed. Further disposition pending results of labs. Discussed med's effects and SE's.   Over 30 minutes of exam, counseling, chart review, and critical decision making was performed Future Appointments  Date Time Provider [place]  11/20/2018 10:50 AM [facility] MM 3 [facility]  02/10/2019  9:30 AM Unk Pinto, MD [facility] None  08/02/2019  9:00 AM Unk Pinto, MD [facility] None     HPI 73 y.o. female  presents for 3 month follow up on hypertension, cholesterol, diabetes and vitamin D deficiency.   She is following with Dr. Doran Durand for numerous ortho concerns; she reports overall has improved with steroid injections and PT. She uses cervical pillow at night; has gabapentin, tylenol, topical Voltaren and baclofen and reports managing well.   She has COPD/asthma which is mild, typically triggered by mowing her grass; she is currently prescribed only PRN albuterol; she reports in the average month she uses 1-2 times, typically after mowing lawn, etc, however she reports frequently having coughing spells at night and intermittent wheezing.   She does have GERD; not currently taking anything since stopping ranitidine after recall;   She is on 1/2 tablet of lorazepam at night, tried melatonin but it did not help.   Her blood pressure has been controlled at home, today their BP is BP: 120/72   She does workout. She denies chest pain, shortness of breath, dizziness.  BMI is Body mass index is 30.42 kg/m., she is working on diet and exercise. Wt Readings from Last 3 Encounters:  11/10/18 182 lb 12.8 oz (82.9 kg)  07/29/18 179 lb 6.4 oz (81.4 kg)  05/13/18  170 lb 9.6 oz (77.4 kg)    She is on cholesterol medication (atorvastatin 40 mg daily since the last visit, increased from 20 mg daily) and denies myalgias. Her cholesterol is not at goal. The cholesterol last visit was:   Lab Results  Component Value Date   CHOL 198  07/29/2018   HDL 59 07/29/2018   LDLCALC 117 (H) 07/29/2018   TRIG 113 07/29/2018   CHOLHDL 3.4 07/29/2018   She has been working on diet and exercise for Diabetes without complications, she is on bASA, she is on ACE/ARB, and denies paresthesia of the feet, polydipsia, polyuria and visual disturbances. She is on metformin (takes 500 mg once daily), she does check fasting glucose - ranging 108-118. Last A1C was:  Lab Results  Component Value Date   HGBA1C 6.6 (H) 07/29/2018   Lab Results  Component Value Date   GFRAA 79 07/29/2018   Patient is on Vitamin D supplement.   Lab Results  Component Value Date   VD25OH 56 07/29/2018       Current Medications:  Current Outpatient Medications on File Prior to Visit  Medication Sig  . Accu-Chek Softclix Lancets lancets CHECK BLOOD GLUCOSE 1 TIME EVERY DAY. Dx-R73.03  . acetaminophen (TYLENOL ARTHRITIS PAIN) 650 MG CR tablet Take 1,300 mg by mouth every 8 (eight) hours as needed. For pain  . albuterol (PROVENTIL HFA;VENTOLIN HFA) 108 (90 Base) MCG/ACT inhaler Inhale 2 puffs into the lungs every 6 (six) hours as needed for wheezing or shortness of breath (cough).  . Ascorbic Acid (VITAMIN C PO) Take 500 mg by mouth daily.   Marland Kitchen atorvastatin (LIPITOR) 40 MG tablet Take 1 tablet (40 mg total) by mouth daily.  . baclofen (LIORESAL) 10 MG tablet Take 1/2 to 1 tablet 2 to 3 x /day ONLY if needed for muscle spasm  . benazepril-hydrochlorthiazide (LOTENSIN HCT) 20-25 MG tablet Take 1 tablet by mouth daily.  . Blood Glucose Monitoring Suppl (ACCU-CHEK AVIVA PLUS) w/Device KIT USE AS DIRECTED  TO CHECK BLOOD SUGAR ONE TIME DAILY.DX-R73.03  . brimonidine (ALPHAGAN) 0.2 % ophthalmic solution Place 1 drop into both eyes daily.  . calcium-vitamin D (OSCAL WITH D) 500-200 MG-UNIT tablet Take 1 tablet by mouth daily with breakfast.  . Cholecalciferol (VITAMIN D3) 5000 units TABS Take 5,000 Units by mouth daily.  . diclofenac sodium (VOLTAREN) 1 % GEL APPLY  2   TO  4  GRAMS TOPICALLY FOUR TIMES DAILY  . dorzolamide-timolol (COSOPT) 22.3-6.8 MG/ML ophthalmic solution Place 1 drop into both eyes 2 (two) times daily.  . fluticasone (FLONASE) 50 MCG/ACT nasal spray SHAKE LIQUID AND USE 2 SPRAYS IN EACH NOSTRIL DAILY  . gabapentin (NEURONTIN) 600 MG tablet TAKE 1/2 TO 1 TABLET TWO TO THREE TIMES DAILY FOR PAIN  . glucose blood (ACCU-CHEK AVIVA PLUS) test strip Check blood glucose 1 time daily-DX-R73.03  . latanoprost (XALATAN) 0.005 % ophthalmic solution Place 1 drop into both eyes at bedtime.  Marland Kitchen LORazepam (ATIVAN) 1 MG tablet Take 1/2-1 tablet 2 - 3 x /day ONLY if needed for Anxiety Attack &  limit to 5 days /week to avoid addiction  . metFORMIN (GLUCOPHAGE-XR) 500 MG 24 hr tablet Take 2 tablets 2 x /day with meal for Diabetes  . Multiple Vitamin (MULTIVITAMIN WITH MINERALS) TABS tablet Take 1 tablet by mouth daily.  . famotidine (PEPCID) 40 MG tablet Take 1 tablet at bedtime for Acid Reflux (Patient not taking: Reported on 11/10/2018)   No current facility-administered medications on  file prior to visit.     Medical History:  Past Medical History:  Diagnosis Date  . Anxiety 07/23/2017  . Asthma   . Bronchitis    hx of;>76yr ago  . Chronic neck pain    arthritis  . COPD [place] disease) [place]   . Degenerative disc disease    neck  . DeQuervain's disease (tenosynovitis)   . Diverticulitis   . Dizziness    related to neck issues  . Frozen shoulder September 2012  . GERD (gastroesophageal reflux disease)    takes Ranitidine nightly  . Glaucoma   . Hay fever   . Headache(784.0)    related neck issues  . High cholesterol    takes Lipitor daily  . History of shingles 2012  . Hypertension    takes Lotensin HCT daily  . Insomnia    d/t pain and takes Ativan nightly  . Osteoporosis   . Sarcoidosis >32yrago   no problems since  . Type II or unspecified type diabetes mellitus without mention of complication, not stated  as uncontrolled    takes Metformin nightly   Allergies:  Allergies  Allergen Reactions  . Penicillins Hives and Shortness Of Breath  . Aspirin Other (See Comments)    Stomach upset, but low dose is ok  . Hydromorphone Nausea Only    Other reaction(s): Sweating (intolerance)  . Sulfa Antibiotics Other (See Comments)    unknown  . Codeine Rash     Review of Systems:  Review of Systems  Constitutional: Negative for malaise/fatigue and weight loss.  HENT: Negative for hearing loss and tinnitus.   Eyes: Negative for blurred vision and double vision.  Respiratory: Positive for cough (non-productive in the evening) and wheezing (intermittent). Negative for shortness of breath.   Cardiovascular: Negative for chest pain, palpitations, orthopnea, claudication and leg swelling.  Gastrointestinal: Negative for abdominal pain, blood in stool, constipation, diarrhea, heartburn, melena, nausea and vomiting.  Genitourinary: Negative.   Musculoskeletal: Negative for joint pain and myalgias.  Skin: Negative for rash.  Neurological: Negative for dizziness, tingling, sensory change, weakness and headaches.  Endo/Heme/Allergies: Negative for polydipsia.  Psychiatric/Behavioral: Negative.   All other systems reviewed and are negative.  Seep HPI  Family history- Review and unchanged Social history- Review and unchanged Physical Exam: BP 120/72   Pulse 67   Temp (!) 96.3 F (35.7 C)   Wt 182 lb 12.8 oz (82.9 kg)   SpO2 97%   BMI 30.42 kg/m  Wt Readings from Last 3 Encounters:  11/10/18 182 lb 12.8 oz (82.9 kg)  07/29/18 179 lb 6.4 oz (81.4 kg)  05/13/18 170 lb 9.6 oz (77.4 kg)   General Appearance: Well nourished, in no apparent distress. Eyes: PERRLA, EOMs, conjunctiva no swelling or erythema Sinuses: No Frontal/maxillary tenderness ENT/Mouth: Ext aud canals clear, TMs without erythema, bulging. No erythema, swelling, or exudate on post pharynx.  Tonsils not swollen or erythematous.  Hearing normal.  Neck: Supple, thyroid normal.  Respiratory: Respiratory effort normal, BS equal bilaterally without rales, rhonchi, or stridor. She does have mild wheezing bil.  Cardio: RRR with no MRGs. Brisk peripheral pulses without edema.  Abdomen: Soft, + BS.  Non tender, no guarding, rebound, hernias, masses. Lymphatics: Non tender without lymphadenopathy.  Musculoskeletal: Full ROM, 5/5 strength, Normal gait Skin: Warm, dry without rashes, lesions, ecchymosis.  Neuro: Cranial nerves intact. No cerebellar symptoms.  Psych: Awake and oriented X 3, normal affect, Insight and Judgment appropriate.    AsCaryl Pina  Talmadge Coventry, NP 9:23 AM [facility]

## 2018-11-10 ENCOUNTER — Other Ambulatory Visit: Payer: Self-pay

## 2018-11-10 ENCOUNTER — Ambulatory Visit (INDEPENDENT_AMBULATORY_CARE_PROVIDER_SITE_OTHER): Payer: Medicare Other | Admitting: Adult Health

## 2018-11-10 ENCOUNTER — Encounter: Payer: Self-pay | Admitting: Adult Health

## 2018-11-10 VITALS — BP 120/72 | HR 67 | Temp 96.3°F | Wt 182.8 lb

## 2018-11-10 DIAGNOSIS — E1169 Type 2 diabetes mellitus with other specified complication: Secondary | ICD-10-CM

## 2018-11-10 DIAGNOSIS — E1122 Type 2 diabetes mellitus with diabetic chronic kidney disease: Secondary | ICD-10-CM

## 2018-11-10 DIAGNOSIS — E663 Overweight: Secondary | ICD-10-CM

## 2018-11-10 DIAGNOSIS — E1121 Type 2 diabetes mellitus with diabetic nephropathy: Secondary | ICD-10-CM | POA: Diagnosis not present

## 2018-11-10 DIAGNOSIS — E559 Vitamin D deficiency, unspecified: Secondary | ICD-10-CM

## 2018-11-10 DIAGNOSIS — E785 Hyperlipidemia, unspecified: Secondary | ICD-10-CM

## 2018-11-10 DIAGNOSIS — G4700 Insomnia, unspecified: Secondary | ICD-10-CM

## 2018-11-10 DIAGNOSIS — N182 Chronic kidney disease, stage 2 (mild): Secondary | ICD-10-CM

## 2018-11-10 DIAGNOSIS — J45909 Unspecified asthma, uncomplicated: Secondary | ICD-10-CM

## 2018-11-10 DIAGNOSIS — Z79899 Other long term (current) drug therapy: Secondary | ICD-10-CM

## 2018-11-10 DIAGNOSIS — I10 Essential (primary) hypertension: Secondary | ICD-10-CM | POA: Diagnosis not present

## 2018-11-10 DIAGNOSIS — J449 Chronic obstructive pulmonary disease, unspecified: Secondary | ICD-10-CM

## 2018-11-10 MED ORDER — DICLOFENAC SODIUM 1 % TD GEL: TRANSDERMAL | 0 refills | Status: DC

## 2018-11-10 MED ORDER — MONTELUKAST SODIUM 10 MG PO TABS: 10.0000 mg | ORAL_TABLET | Freq: Every day | ORAL | 0 refills | Status: DC

## 2018-11-10 MED ORDER — FAMOTIDINE 40 MG PO TABS: ORAL_TABLET | ORAL | 3 refills | Status: DC

## 2018-11-10 NOTE — Patient Instructions (Addendum)
Goals    . Weight (lb) < 165 lb (74.8 kg) (pt-stated)      Please call your insurance about albuterol - is there a cheaper option that they cover If still too pricey, call me and can set you up for a nebulizer at home   Try taking pepcid/famotidine prior to dinner for 1-2 weeks;   If this doesn't help coughing try albuterol 1 puff after dinner  Message back with how you did     Montelukast oral tablets What is this medicine? MONTELUKAST (mon te LOO kast) is used to prevent and treat the symptoms of asthma. It is also used to treat allergies. Do not use for an acute asthma attack. This medicine may be used for other purposes; ask your health care provider or pharmacist if you have questions. COMMON BRAND NAME(S): Singulair What should I tell my health care provider before I take this medicine? They need to know if you have any of these conditions:  liver disease  an unusual or allergic reaction to montelukast, other medicines, foods, dyes, or preservatives  pregnant or trying to get pregnant  breast-feeding How should I use this medicine? This medicine should be given by mouth. Follow the directions on the prescription label. Take this medicine at the same time every day. You may take this medicine with or without meals. Do not chew the tablets. Do not stop taking your medicine unless your doctor tells you to. Talk to your pediatrician regarding the use of this medicine in children. Special care may be needed. While this drug may be prescribed for children as young as 71 years of age for selected conditions, precautions do apply. Overdosage: If you think you have taken too much of this medicine contact a poison control center or emergency room at once. NOTE: This medicine is only for you. Do not share this medicine with others. What if I miss a dose? If you miss a dose, skip it. Take your next dose at the normal time. Do not take extra or 2 doses at the same time to make up for  the missed dose. What may interact with this medicine?  anti-infectives like rifampin and rifabutin  medicines for seizures like phenytoin, phenobarbital, and carbamazepine This list may not describe all possible interactions. Give your health care provider a list of all the medicines, herbs, non-prescription drugs, or dietary supplements you use. Also tell them if you smoke, drink alcohol, or use illegal drugs. Some items may interact with your medicine. What should I watch for while using this medicine? Visit your doctor or health care professional for regular checks on your progress. Tell your doctor or health care professional if your allergy or asthma symptoms do not improve. Take your medicine even when you do not have symptoms. Do not stop taking any of your medicine(s) unless your doctor tells you to. If you have asthma, talk to your doctor about what to do in an acute asthma attack. Always have your inhaled rescue medicine for asthma attacks with you. Patients and their families should watch for new or worsening thoughts of suicide or depression. Also watch for sudden changes in feelings such as feeling anxious, agitated, panicky, irritable, hostile, aggressive, impulsive, severely restless, overly excited and hyperactive, or not being able to sleep. Any worsening of mood or thoughts of suicide or dying should be reported to your health care professional right away. What side effects may I notice from receiving this medicine? Side effects that you should report  to your doctor or health care professional as soon as possible:  allergic reactions like skin rash or hives, or swelling of the face, lips, or tongue  breathing problems  changes in emotions or moods  confusion  depressed mood  fever or infection  hallucinations  joint pain  painful lumps under the skin  pain, tingling, numbness in the hands or feet  redness, blistering, peeling, or loosening of the skin, including  inside the mouth  restlessness  seizures  sleep walking  signs and symptoms of infection like fever; chills; cough; sore throat; flu-like illness  signs and symptoms of liver injury like dark yellow or brown urine; general ill feeling or flu-like symptoms; light-colored stools; loss of appetite; nausea; right upper belly pain; unusually weak or tired; yellowing of the eyes or skin  sinus pain or swelling  stuttering  suicidal thoughts or other mood changes  tremors  trouble sleeping  uncontrolled muscle movements  unusual bleeding or bruising  vivid or bad dreams Side effects that usually do not require medical attention (report to your doctor or health care professional if they continue or are bothersome):  dizziness  drowsiness  headache  runny nose  stomach upset  tiredness This list may not describe all possible side effects. Call your doctor for medical advice about side effects. You may report side effects to FDA at 1-800-FDA-1088. Where should I keep my medicine? Keep out of the reach of children. Store at room temperature between 15 and 30 degrees C (59 and 86 degrees F). Protect from light and moisture. Keep this medicine in the original bottle. Throw away any unused medicine after the expiration date. NOTE: This sheet is a summary. It may not cover all possible information. If you have questions about this medicine, talk to your doctor, pharmacist, or health care provider.  2020 [employer]vier/Gold Standard (2018-07-03 12:54:33)

## 2018-11-11 LAB — LIPID PANEL
Cholesterol: 177 mg/dL (ref <200)
HDL: 56 mg/dL (ref >=50)
LDL Cholesterol (Calc): 104 mg/dL (calc) — ABNORMAL HIGH
Non-HDL Cholesterol (Calc): 121 mg/dL (calc) (ref <130)
Total CHOL/HDL Ratio: 3.2 (calc) (ref <5.0)
Triglycerides: 76 mg/dL (ref <150)

## 2018-11-11 LAB — CBC WITH DIFFERENTIAL/PLATELET
Absolute Monocytes: 349 cells/uL (ref 200–950)
Basophils Absolute: 22 cells/uL (ref 0–200)
Basophils Relative: 0.6 %
Eosinophils Absolute: 101 cells/uL (ref 15–500)
Eosinophils Relative: 2.8 %
HCT: 37 % (ref 35.0–45.0)
Hemoglobin: 11.9 g/dL (ref 11.7–15.5)
Lymphs Abs: 1465 cells/uL (ref 850–3900)
MCH: 29.8 pg (ref 27.0–33.0)
MCHC: 32.2 g/dL (ref 32.0–36.0)
MCV: 92.5 fL (ref 80.0–100.0)
MPV: 10.4 fL (ref 7.5–12.5)
Monocytes Relative: 9.7 %
Neutro Abs: 1663 cells/uL (ref 1500–7800)
Neutrophils Relative %: 46.2 %
Platelets: 253 10*3/uL (ref 140–400)
RBC: 4 10*6/uL (ref 3.80–5.10)
RDW: 13.1 % (ref 11.0–15.0)
Total Lymphocyte: 40.7 %
WBC: 3.6 10*3/uL — ABNORMAL LOW (ref 3.8–10.8)

## 2018-11-11 LAB — HEMOGLOBIN A1C
Hgb A1c MFr Bld: 6.8 % of total Hgb — ABNORMAL HIGH (ref <5.7)
Mean Plasma Glucose: 148 (calc)
eAG (mmol/L): 8.2 (calc)

## 2018-11-11 LAB — MAGNESIUM: Magnesium: 2.1 mg/dL (ref 1.5–2.5)

## 2018-11-11 LAB — COMPLETE METABOLIC PANEL WITH GFR
AG Ratio: 1.9 (calc) (ref 1.0–2.5)
ALT: 19 U/L (ref 6–29)
AST: 17 U/L (ref 10–35)
Albumin: 4.2 g/dL (ref 3.6–5.1)
Alkaline phosphatase (APISO): 51 U/L (ref 37–153)
BUN: 12 mg/dL (ref 7–25)
CO2: 28 mmol/L (ref 20–32)
Calcium: 9.9 mg/dL (ref 8.6–10.4)
Chloride: 105 mmol/L (ref 98–110)
Creat: 0.77 mg/dL (ref 0.60–0.93)
GFR, Est African American: 91 mL/min/{1.73_m2} (ref >=60)
GFR, Est Non African American: 78 mL/min/{1.73_m2} (ref >=60)
Globulin: 2.2 g/dL (calc) (ref 1.9–3.7)
Glucose, Bld: 130 mg/dL — ABNORMAL HIGH (ref 65–99)
Potassium: 4.6 mmol/L (ref 3.5–5.3)
Sodium: 139 mmol/L (ref 135–146)
Total Bilirubin: 0.5 mg/dL (ref 0.2–1.2)
Total Protein: 6.4 g/dL (ref 6.1–8.1)

## 2018-11-11 LAB — TSH: TSH: 1.22 mIU/L (ref 0.40–4.50)

## 2018-11-20 ENCOUNTER — Ambulatory Visit
Admission: RE | Admit: 2018-11-20 | Discharge: 2018-11-20 | Disposition: A | Discharge status: Home / Self Care | Payer: Medicare Other | Source: Ambulatory Visit | Attending: Internal Medicine | Admitting: Internal Medicine

## 2018-11-20 ENCOUNTER — Other Ambulatory Visit: Payer: Self-pay

## 2018-11-20 DIAGNOSIS — Z1231 Encounter for screening mammogram for malignant neoplasm of breast: Secondary | ICD-10-CM

## 2018-12-16 ENCOUNTER — Other Ambulatory Visit: Payer: Self-pay

## 2018-12-16 DIAGNOSIS — Z20822 Contact with and (suspected) exposure to covid-19: Secondary | ICD-10-CM

## 2018-12-17 LAB — NOVEL CORONAVIRUS, NAA: SARS-CoV-2, NAA: NOT DETECTED

## 2018-12-31 ENCOUNTER — Other Ambulatory Visit: Payer: Self-pay

## 2018-12-31 MED ORDER — ATORVASTATIN CALCIUM 40 MG PO TABS: 40.0000 mg | ORAL_TABLET | Freq: Every day | ORAL | 1 refills | Status: DC

## 2019-01-11 ENCOUNTER — Ambulatory Visit: Payer: Medicare Other

## 2019-01-20 ENCOUNTER — Ambulatory Visit: Payer: Medicare Other

## 2019-02-05 ENCOUNTER — Other Ambulatory Visit: Payer: Self-pay | Admitting: Adult Health

## 2019-02-05 DIAGNOSIS — J45909 Unspecified asthma, uncomplicated: Secondary | ICD-10-CM

## 2019-02-05 DIAGNOSIS — J449 Chronic obstructive pulmonary disease, unspecified: Secondary | ICD-10-CM

## 2019-02-05 MED ORDER — ALBUTEROL SULFATE (2.5 MG/3ML) 0.083% IN NEBU: 2.5000 mg | INHALATION_SOLUTION | Freq: Four times a day (QID) | RESPIRATORY_TRACT | 12 refills | Status: DC | PRN

## 2019-02-09 ENCOUNTER — Encounter: Payer: Self-pay | Admitting: Internal Medicine

## 2019-02-09 NOTE — Progress Notes (Signed)
History of Present Illness:      This very nice 73 y.o. single BF  presents for 6 month follow up with HTN, HLD, Pre-Diabetes and Vitamin D Deficiency.       Patient also relates that she's seeing Dr Lorin Mercy for Left neck & shoulder pains.       Patient is treated for HTN  (2003) & BP has been controlled at home. Today's BP is at goal -  106/60. Patient has had no complaints of any cardiac type chest pain, palpitations, dyspnea / orthopnea / PND, dizziness, claudication, or dependent edema.      Hyperlipidemia is near controlled with diet & meds. Patient denies myalgias or other med SE's. Last Lipids were near goal:  Lab Results  Component Value Date   CHOL 177 11/10/2018   HDL 56 11/10/2018   LDLCALC 104 (H) 11/10/2018   TRIG 76 11/10/2018   CHOLHDL 3.2 11/10/2018    Also, the patient has history of T2_NIDDM  (A1c  7.4% / 2011) w/CKD2 and has had no symptoms of reactive hypoglycemia, diabetic polys, paresthesias or visual blurring.   Patient admits that she's not dietary compliant and last A1c was not at goal:  Lab Results  Component Value Date   HGBA1C 6.8 (H) 11/10/2018        Further, the patient also has history of Vitamin D Deficiency ("42" / 2014) and supplements vitamin D without any suspected side-effects. Last vitamin D was near goal (70-100): Lab Results  Component Value Date   VD25OH 56 07/29/2018    Current Outpatient Medications on File Prior to Visit  Medication Sig  . Accu-Chek Softclix Lancets lancets CHECK BLOOD GLUCOSE 1 TIME EVERY DAY. [id number]-R73.03  . acetaminophen (TYLENOL ARTHRITIS PAIN) 650 MG CR tablet Take 1,300 mg by mouth every 8 (eight) hours as needed. For pain  . albuterol (PROVENTIL) (2.5 MG/3ML) 0.083% nebulizer solution Take 3 mLs (2.5 mg total) by nebulization every 6 (six) hours as needed for wheezing or shortness of breath.  . Ascorbic Acid (VITAMIN C PO) Take 500 mg by mouth daily.   Marland Kitchen atorvastatin (LIPITOR) 40 MG tablet Take 1 tablet (40  mg total) by mouth daily.  . baclofen (LIORESAL) 10 MG tablet Take 1/2 to 1 tablet 2 to 3 x /day ONLY if needed for muscle spasm  . benazepril-hydrochlorthiazide (LOTENSIN HCT) 20-25 MG tablet Take 1 tablet by mouth daily.  . Blood Glucose Monitoring Suppl (ACCU-CHEK AVIVA PLUS) w/Device KIT USE AS DIRECTED  TO CHECK BLOOD SUGAR ONE TIME DAILY.DX-R73.03  . brimonidine (ALPHAGAN) 0.2 % ophthalmic solution Place 1 drop into both eyes daily.  . calcium-vitamin D (OSCAL WITH D) 500-200 MG-UNIT tablet Take 1 tablet by mouth daily with breakfast.  . Cholecalciferol (VITAMIN D3) 5000 units TABS Take 10,000 Units by mouth daily.   . diclofenac sodium (VOLTAREN) 1 % GEL APPLY  2  TO  4  GRAMS TOPICALLY FOUR TIMES DAILY  . dorzolamide-timolol (COSOPT) 22.3-6.8 MG/ML ophthalmic solution Place 1 drop into both eyes 2 (two) times daily.  . famotidine (PEPCID) 40 MG tablet Take 1 tablet prior to dinner for Acid Reflux  . fluticasone (FLONASE) 50 MCG/ACT nasal spray SHAKE LIQUID AND USE 2 SPRAYS IN EACH NOSTRIL DAILY  . gabapentin (NEURONTIN) 600 MG tablet TAKE 1/2 TO 1 TABLET TWO TO THREE TIMES DAILY FOR PAIN  . glucose blood (ACCU-CHEK AVIVA PLUS) test strip Check blood glucose 1 time daily-DX-R73.03  . latanoprost (XALATAN) 0.005 %  ophthalmic solution Place 1 drop into both eyes at bedtime.  Marland Kitchen LORazepam (ATIVAN) 1 MG tablet Take 1/2-1 tablet 2 - 3 x /day ONLY if needed for Anxiety Attack &  limit to 5 days /week to avoid addiction  . metFORMIN (GLUCOPHAGE-XR) 500 MG 24 hr tablet Take 2 tablets 2 x /day with meal for Diabetes  . montelukast (SINGULAIR) 10 MG tablet Take 1 tablet (10 mg total) by mouth at bedtime.  . Multiple Vitamin (MULTIVITAMIN WITH MINERALS) TABS tablet Take 1 tablet by mouth daily.   No current facility-administered medications on file prior to visit.     Allergies  Allergen Reactions  . Penicillins Hives and Shortness Of Breath  . Aspirin Other (See Comments)    Stomach upset, but  low dose is ok  . Hydromorphone Nausea Only    Other reaction(s): Sweating (intolerance)  . Sulfa Antibiotics Other (See Comments)    unknown  . Codeine Rash    PMHx:   Past Medical History:  Diagnosis Date  . Anxiety 07/23/2017  . Asthma   . Bronchitis    hx of;>57yr ago  . Chronic neck pain    arthritis  . COPD (chronic obstructive pulmonary disease) ([place]   . Degenerative disc disease    neck  . DeQuervain's disease (tenosynovitis)   . Diverticulitis   . Dizziness    related to neck issues  . Frozen shoulder September 2012  . GERD (gastroesophageal reflux disease)    takes Ranitidine nightly  . Glaucoma   . Hay fever   . Headache(784.0)    related neck issues  . High cholesterol    takes Lipitor daily  . History of shingles 2012  . Hypertension    takes Lotensin HCT daily  . Insomnia    d/t pain and takes Ativan nightly  . Osteoporosis   . Sarcoidosis >363yrago   no problems since  . Type II or unspecified type diabetes mellitus without mention of complication, not stated as uncontrolled    takes Metformin nightly   Immunization History  Administered Date(s) Administered  . Influenza, High Dose Seasonal PF 01/04/2015, 12/31/2016, 12/05/2017, 02/10/2019  . Influenza-Unspecified 03/01/2016  . Pneumococcal Conjugate-13 11/16/2013  . Pneumococcal Polysaccharide-23 04/12/2015  . Pneumococcal-Unspecified 03/18/2005  . Tdap 03/18/2009  . Zoster 11/13/2012   Past Surgical History:  Procedure Laterality Date  . ABDOMINAL HYSTERECTOMY  1975  . ANTERIOR CERVICAL DECOMP/DISCECTOMY FUSION  07/31/2011   Procedure: ANTERIOR CERVICAL DECOMPRESSION/DISCECTOMY FUSION 3 LEVELS;  Surgeon: RoHosie SpangleMD;  [facility];  Service: Neurosurgery;  Laterality: N/A;  Cervical four-five,Cervical five-six,Cervical six-seven anterior cervical decompression with fusion plating and bonegraft  . BIOPSY  10/31/2017   Procedure: BIOPSY;  Surgeon: WhIleana RoupMD;   [facility];  Service: General;;  . BREAST LUMPECTOMY Left   . BREAST SURGERY  20+yrs ago   left breast;benign  . CARPAL TUNNEL RELEASE  2012  . COLONOSCOPY    . COLONOSCOPY WITH PROPOFOL N/A 10/31/2017   Procedure: COLONOSCOPY SCREENING;  Surgeon: WhIleana RoupMD;  [facility];  Service: General;  Laterality: N/A;  . REFRACTIVE SURGERY  2011   bilateral  . TONSILLECTOMY  30+yrs ago  . WRIST SURGERY     ORIF left wrist and then removed d/t protruding screw in 2012    FHx:    Reviewed / unchanged  SHx:    Reviewed / unchanged   Systems Review:  Constitutional: Denies fever, chills, wt changes,  headaches, insomnia, fatigue, night sweats, change in appetite. Eyes: Denies redness, blurred vision, diplopia, discharge, itchy, watery eyes.  ENT: Denies discharge, congestion, post nasal drip, epistaxis, sore throat, earache, hearing loss, dental pain, tinnitus, vertigo, sinus pain, snoring.  CV: Denies chest pain, palpitations, irregular heartbeat, syncope, dyspnea, diaphoresis, orthopnea, PND, claudication or edema. Respiratory: denies cough, dyspnea, DOE, pleurisy, hoarseness, laryngitis, wheezing.  Gastrointestinal: Denies dysphagia, odynophagia, heartburn, reflux, water brash, abdominal pain or cramps, nausea, vomiting, bloating, diarrhea, constipation, hematemesis, melena, hematochezia  or hemorrhoids. Genitourinary: Denies dysuria, frequency, urgency, nocturia, hesitancy, discharge, hematuria or flank pain. Musculoskeletal: Denies arthralgias, myalgias, stiffness, jt. swelling, pain, limping or strain/sprain.  Skin: Denies pruritus, rash, hives, warts, acne, eczema or change in skin lesion(s). Neuro: No weakness, tremor, incoordination, spasms, paresthesia or pain. Psychiatric: Denies confusion, memory loss or sensory loss. Endo: Denies change in weight, skin or hair change.  Heme/Lymph: No excessive bleeding, bruising or enlarged lymph nodes.  Physical  Exam  BP 106/60   Pulse 76   Temp (!) 97 F (36.1 C)   Resp 16   Ht _0  (1.651 m)   Wt 181 lb 3.2 oz (82.2 kg)   BMI 30.15 kg/m   Appears  well nourished, well groomed  and in no distress.  Eyes: PERRLA, EOMs, conjunctiva no swelling or erythema. Sinuses: No frontal/maxillary tenderness ENT/Mouth: EAC's clear, TM's nl w/o erythema, bulging. Nares clear w/o erythema, swelling, exudates. Oropharynx clear without erythema or exudates. Oral hygiene is good. Tongue normal, non obstructing. Hearing intact.  Neck: Supple. Thyroid not palpable. Car 2+/2+ without bruits, nodes or JVD. Chest: Respirations nl with BS clear & equal w/o rales, rhonchi, wheezing or stridor.  Cor: Heart sounds normal w/ regular rate and rhythm without sig. murmurs, gallops, clicks or rubs. Peripheral pulses normal and equal  without edema.  Abdomen: Soft & bowel sounds normal. Non-tender w/o guarding, rebound, hernias, masses or organomegaly.  Lymphatics: Unremarkable.  Musculoskeletal: Full ROM all peripheral extremities, joint stability, 5/5 strength and normal gait.  Skin: Warm, dry without exposed rashes, lesions or ecchymosis apparent.  Neuro: Cranial nerves intact, reflexes equal bilaterally. Sensory-motor testing grossly intact. Tendon reflexes grossly intact.  Pysch: Alert & oriented x 3.  Insight and judgement nl & appropriate. No ideations.  Assessment and Plan:  1. Essential hypertension  - Continue medication, monitor blood pressure at home.  - Continue DASH diet.  Reminder to go to the ER if any CP,  SOB, nausea, dizziness, severe HA, changes vision/speech.  - CBC with Diff - COMPLETE METABOLIC PANEL WITH GFR - Magnesium - TSH  2. Hyperlipidemia associated with type 2 diabetes mellitus ([place])  - Continue diet/meds, exercise,& lifestyle modifications.  - Continue monitor periodic cholesterol/liver & renal functions   - Lipid Profile - TSH  3. Type 2 diabetes mellitus with stage 2 chronic  kidney disease, without long-term current use of insulin ([facility])  - Continue diet, exercise  - Lifestyle modifications.  - Monitor appropriate labs.  - Hemoglobin A1c ([facility]) - Insulin, random  4. Vitamin D deficiency  - Continue supplementation.  - Vitamin D (25 hydroxy)  5. Medication management  - CBC with Diff - COMPLETE METABOLIC PANEL WITH GFR - Magnesium - Lipid Profile - TSH - Hemoglobin A1c ([facility]) - Insulin, random - Vitamin D (25 hydroxy)  6. Need for immunization against influenza       Discussed  regular exercise, BP monitoring, weight control to achieve/maintain BMI less than 25 and discussed med and SE's. Recommended  labs to assess and monitor clinical status with further disposition pending results of labs.  I discussed the assessment and treatment plan with the patient. The patient was provided an opportunity to ask questions and all were answered. The patient agreed with the plan and demonstrated an understanding of the instructions.  I provided over 30 minutes of exam, counseling, chart review and  complex critical decision making.  Kirtland Bouchard, MD

## 2019-02-10 ENCOUNTER — Other Ambulatory Visit: Payer: Self-pay

## 2019-02-10 ENCOUNTER — Ambulatory Visit (INDEPENDENT_AMBULATORY_CARE_PROVIDER_SITE_OTHER): Payer: Medicare Other | Admitting: Internal Medicine

## 2019-02-10 VITALS — BP 106/60 | HR 76 | Temp 97.0°F | Resp 16 | Ht 65.0 in | Wt 181.2 lb

## 2019-02-10 DIAGNOSIS — I10 Essential (primary) hypertension: Secondary | ICD-10-CM

## 2019-02-10 DIAGNOSIS — E785 Hyperlipidemia, unspecified: Secondary | ICD-10-CM

## 2019-02-10 DIAGNOSIS — E1122 Type 2 diabetes mellitus with diabetic chronic kidney disease: Secondary | ICD-10-CM

## 2019-02-10 DIAGNOSIS — E1169 Type 2 diabetes mellitus with other specified complication: Secondary | ICD-10-CM | POA: Diagnosis not present

## 2019-02-10 DIAGNOSIS — Z23 Encounter for immunization: Secondary | ICD-10-CM

## 2019-02-10 DIAGNOSIS — Z79899 Other long term (current) drug therapy: Secondary | ICD-10-CM

## 2019-02-10 DIAGNOSIS — N182 Chronic kidney disease, stage 2 (mild): Secondary | ICD-10-CM

## 2019-02-10 DIAGNOSIS — E559 Vitamin D deficiency, unspecified: Secondary | ICD-10-CM | POA: Diagnosis not present

## 2019-02-10 NOTE — Patient Instructions (Signed)

## 2019-02-12 LAB — CBC WITH DIFFERENTIAL/PLATELET
Absolute Monocytes: 304 cells/uL (ref 200–950)
Basophils Absolute: 31 cells/uL (ref 0–200)
Basophils Relative: 0.8 %
Eosinophils Absolute: 70 cells/uL (ref 15–500)
Eosinophils Relative: 1.8 %
HCT: 37.9 % (ref 35.0–45.0)
Hemoglobin: 12.2 g/dL (ref 11.7–15.5)
Lymphs Abs: 1396 cells/uL (ref 850–3900)
MCH: 29.9 pg (ref 27.0–33.0)
MCHC: 32.2 g/dL (ref 32.0–36.0)
MCV: 92.9 fL (ref 80.0–100.0)
MPV: 10.4 fL (ref 7.5–12.5)
Monocytes Relative: 7.8 %
Neutro Abs: 2098 cells/uL (ref 1500–7800)
Neutrophils Relative %: 53.8 %
Platelets: 269 10*3/uL (ref 140–400)
RBC: 4.08 10*6/uL (ref 3.80–5.10)
RDW: 13.3 % (ref 11.0–15.0)
Total Lymphocyte: 35.8 %
WBC: 3.9 10*3/uL (ref 3.8–10.8)

## 2019-02-12 LAB — COMPLETE METABOLIC PANEL WITH GFR
AG Ratio: 2 (calc) (ref 1.0–2.5)
ALT: 20 U/L (ref 6–29)
AST: 18 U/L (ref 10–35)
Albumin: 4.3 g/dL (ref 3.6–5.1)
Alkaline phosphatase (APISO): 69 U/L (ref 37–153)
BUN: 12 mg/dL (ref 7–25)
CO2: 28 mmol/L (ref 20–32)
Calcium: 9.8 mg/dL (ref 8.6–10.4)
Chloride: 106 mmol/L (ref 98–110)
Creat: 0.71 mg/dL (ref 0.60–0.93)
GFR, Est African American: 99 mL/min/{1.73_m2} (ref >=60)
GFR, Est Non African American: 86 mL/min/{1.73_m2} (ref >=60)
Globulin: 2.2 g/dL (calc) (ref 1.9–3.7)
Glucose, Bld: 110 mg/dL — ABNORMAL HIGH (ref 65–99)
Potassium: 4.1 mmol/L (ref 3.5–5.3)
Sodium: 140 mmol/L (ref 135–146)
Total Bilirubin: 0.5 mg/dL (ref 0.2–1.2)
Total Protein: 6.5 g/dL (ref 6.1–8.1)

## 2019-02-12 LAB — LIPID PANEL
Cholesterol: 142 mg/dL (ref <200)
HDL: 49 mg/dL — ABNORMAL LOW (ref >=50)
LDL Cholesterol (Calc): 74 mg/dL (calc)
Non-HDL Cholesterol (Calc): 93 mg/dL (calc) (ref <130)
Total CHOL/HDL Ratio: 2.9 (calc) (ref <5.0)
Triglycerides: 100 mg/dL (ref <150)

## 2019-02-12 LAB — MAGNESIUM: Magnesium: 1.9 mg/dL (ref 1.5–2.5)

## 2019-02-12 LAB — HEMOGLOBIN A1C
Hgb A1c MFr Bld: 6.7 % of total Hgb — ABNORMAL HIGH (ref <5.7)
Mean Plasma Glucose: 146 (calc)
eAG (mmol/L): 8.1 (calc)

## 2019-02-12 LAB — VITAMIN D 25 HYDROXY (VIT D DEFICIENCY, FRACTURES): Vit D, 25-Hydroxy: 57 ng/mL (ref 30–100)

## 2019-02-12 LAB — INSULIN, RANDOM: Insulin: 9.5 u[IU]/mL

## 2019-02-12 LAB — TSH: TSH: 1.83 mIU/L (ref 0.40–4.50)

## 2019-02-22 DIAGNOSIS — H401133 Primary open-angle glaucoma, bilateral, severe stage: Secondary | ICD-10-CM | POA: Diagnosis not present

## 2019-03-04 ENCOUNTER — Other Ambulatory Visit: Payer: Self-pay | Admitting: *Deleted

## 2019-03-05 ENCOUNTER — Other Ambulatory Visit: Payer: Self-pay | Admitting: Cardiology

## 2019-03-05 DIAGNOSIS — Z20822 Contact with and (suspected) exposure to covid-19: Secondary | ICD-10-CM

## 2019-03-07 LAB — NOVEL CORONAVIRUS, NAA: SARS-CoV-2, NAA: NOT DETECTED

## 2019-03-15 DIAGNOSIS — H524 Presbyopia: Secondary | ICD-10-CM | POA: Diagnosis not present

## 2019-03-15 DIAGNOSIS — H401133 Primary open-angle glaucoma, bilateral, severe stage: Secondary | ICD-10-CM | POA: Diagnosis not present

## 2019-03-15 DIAGNOSIS — H2513 Age-related nuclear cataract, bilateral: Secondary | ICD-10-CM | POA: Diagnosis not present

## 2019-03-15 DIAGNOSIS — H52203 Unspecified astigmatism, bilateral: Secondary | ICD-10-CM | POA: Diagnosis not present

## 2019-03-15 DIAGNOSIS — H5213 Myopia, bilateral: Secondary | ICD-10-CM | POA: Diagnosis not present

## 2019-03-18 ENCOUNTER — Other Ambulatory Visit: Payer: Self-pay

## 2019-03-18 DIAGNOSIS — Z20822 Contact with and (suspected) exposure to covid-19: Secondary | ICD-10-CM

## 2019-03-20 LAB — NOVEL CORONAVIRUS, NAA: SARS-CoV-2, NAA: NOT DETECTED

## 2019-03-22 ENCOUNTER — Encounter: Payer: Self-pay | Admitting: Internal Medicine

## 2019-04-22 ENCOUNTER — Encounter: Payer: Self-pay | Admitting: Physician Assistant

## 2019-04-27 ENCOUNTER — Other Ambulatory Visit: Payer: Self-pay | Admitting: Internal Medicine

## 2019-04-27 DIAGNOSIS — I10 Essential (primary) hypertension: Secondary | ICD-10-CM

## 2019-04-27 DIAGNOSIS — M533 Sacrococcygeal disorders, not elsewhere classified: Secondary | ICD-10-CM

## 2019-04-27 DIAGNOSIS — M47816 Spondylosis without myelopathy or radiculopathy, lumbar region: Secondary | ICD-10-CM

## 2019-04-27 MED ORDER — BENAZEPRIL-HYDROCHLOROTHIAZIDE 20-25 MG PO TABS: ORAL_TABLET | ORAL | 3 refills | Status: AC

## 2019-04-27 MED ORDER — BACLOFEN 10 MG PO TABS: ORAL_TABLET | ORAL | 0 refills | Status: DC

## 2019-04-27 MED ORDER — GABAPENTIN 600 MG PO TABS: ORAL_TABLET | ORAL | 1 refills | Status: DC

## 2019-05-13 ENCOUNTER — Other Ambulatory Visit: Payer: Self-pay

## 2019-05-13 ENCOUNTER — Encounter: Payer: Self-pay | Admitting: Adult Health Nurse Practitioner

## 2019-05-13 ENCOUNTER — Ambulatory Visit (INDEPENDENT_AMBULATORY_CARE_PROVIDER_SITE_OTHER): Payer: Medicare Other | Admitting: Adult Health Nurse Practitioner

## 2019-05-13 VITALS — BP 110/64 | HR 75 | Temp 97.5°F | Ht 65.0 in | Wt 181.4 lb

## 2019-05-13 DIAGNOSIS — Z6830 Body mass index (BMI) 30.0-30.9, adult: Secondary | ICD-10-CM

## 2019-05-13 DIAGNOSIS — M419 Scoliosis, unspecified: Secondary | ICD-10-CM

## 2019-05-13 DIAGNOSIS — J449 Chronic obstructive pulmonary disease, unspecified: Secondary | ICD-10-CM

## 2019-05-13 DIAGNOSIS — E1122 Type 2 diabetes mellitus with diabetic chronic kidney disease: Secondary | ICD-10-CM

## 2019-05-13 DIAGNOSIS — N182 Chronic kidney disease, stage 2 (mild): Secondary | ICD-10-CM

## 2019-05-13 DIAGNOSIS — M810 Age-related osteoporosis without current pathological fracture: Secondary | ICD-10-CM

## 2019-05-13 DIAGNOSIS — H401133 Primary open-angle glaucoma, bilateral, severe stage: Secondary | ICD-10-CM

## 2019-05-13 DIAGNOSIS — I10 Essential (primary) hypertension: Secondary | ICD-10-CM

## 2019-05-13 DIAGNOSIS — E1121 Type 2 diabetes mellitus with diabetic nephropathy: Secondary | ICD-10-CM

## 2019-05-13 DIAGNOSIS — E1169 Type 2 diabetes mellitus with other specified complication: Secondary | ICD-10-CM | POA: Diagnosis not present

## 2019-05-13 DIAGNOSIS — K219 Gastro-esophageal reflux disease without esophagitis: Secondary | ICD-10-CM | POA: Diagnosis not present

## 2019-05-13 DIAGNOSIS — E559 Vitamin D deficiency, unspecified: Secondary | ICD-10-CM | POA: Diagnosis not present

## 2019-05-13 DIAGNOSIS — G4700 Insomnia, unspecified: Secondary | ICD-10-CM

## 2019-05-13 DIAGNOSIS — Z0001 Encounter for general adult medical examination with abnormal findings: Secondary | ICD-10-CM | POA: Diagnosis not present

## 2019-05-13 DIAGNOSIS — E785 Hyperlipidemia, unspecified: Secondary | ICD-10-CM

## 2019-05-13 DIAGNOSIS — R6889 Other general symptoms and signs: Secondary | ICD-10-CM

## 2019-05-13 DIAGNOSIS — M47816 Spondylosis without myelopathy or radiculopathy, lumbar region: Secondary | ICD-10-CM

## 2019-05-13 DIAGNOSIS — Z0000 Encounter for general adult medical examination without abnormal findings: Secondary | ICD-10-CM

## 2019-05-13 DIAGNOSIS — J45909 Unspecified asthma, uncomplicated: Secondary | ICD-10-CM

## 2019-05-13 DIAGNOSIS — Z79899 Other long term (current) drug therapy: Secondary | ICD-10-CM

## 2019-05-13 MED ORDER — FLUTICASONE PROPIONATE 50 MCG/ACT NA SUSP: NASAL | 1 refills | Status: DC

## 2019-05-13 NOTE — Patient Instructions (Addendum)
Continue taking your gabapentin three times a day.  Monitor your arm/neck pain.  You can use ice, 20min, at a time to help reduce any inflammation.  We will contact you with your lab results via myChart.    Vit D  & Vit C 1,000 mg   are recommended to help protect  against the Covid-19 and other Corona viruses.    Also it's recommended  to take  Zinc 50 mg  to help  protect against the Covid-19   and best place to get  is also on [employer]  and don't pay more than 6-8 cents /pill !  ================================ Coronavirus (COVID-19) Are you at risk?  Are you at risk for the Coronavirus (COVID-19)?  To be considered HIGH RISK for Coronavirus (COVID-19), you have to meet the following criteria:  . Traveled to [place], [place], [place], [place] or [place]; or in the [place] to [place], [place], [place]  . or [place]; and have fever, cough, and shortness of breath within the last 2 weeks of travel OR . Been in close contact with a person diagnosed with COVID-19 within the last 2 weeks and have  . fever, cough,and shortness of breath .  . IF YOU DO NOT MEET THESE CRITERIA, YOU ARE CONSIDERED LOW RISK FOR COVID-19.  What to do if you are HIGH RISK for COVID-19?  . If you are having a medical emergency, call 911. . Seek medical care right away. Before you go to a doctor's office, urgent care or emergency department, .  call ahead and tell them about your recent travel, contact with someone diagnosed with COVID-19  .  and your symptoms.  . You should receive instructions from your physician's office regarding next steps of care.  . When you arrive at healthcare provider, tell the healthcare staff immediately you have returned from  . visiting [place], [place], [place], [place] or [place]; or traveled in the [place] to [place], [place],  . [place] or [place] in the last two weeks or you have been in close contact with a person diagnosed with   . COVID-19 in the last 2 weeks.   . Tell the health care staff about your symptoms: fever, cough and shortness of breath. . After you have been seen by a medical provider, you will be either: o Tested for (COVID-19) and discharged home on quarantine except to seek medical care if  o symptoms worsen, and asked to  - Stay home and avoid contact with others until you get your results (4-5 days)  - Avoid travel on public transportation if possible (such as bus, train, or airplane) or o Sent to the Emergency Department by EMS for evaluation, COVID-19 testing  and  o possible admission depending on your condition and test results.  What to do if you are LOW RISK for COVID-19?  Reduce your risk of any infection by using the same precautions used for avoiding the common cold or flu:  . Wash your hands often with soap and warm water for at least 20 seconds.  If soap and water are not readily available,  . use an alcohol-based hand sanitizer with at least 60% alcohol.  . If coughing or sneezing, cover your mouth and nose by coughing or sneezing into the elbow areas of your shirt or coat, .  into a tissue or into your sleeve (not your hands). . Avoid shaking hands with others and consider head nods or verbal greetings   only. . Avoid touching your eyes, nose, or mouth with unwashed hands.  . Avoid close contact with people who are sick. . Avoid places or events with large numbers of people in one location, like concerts or sporting events. . Carefully consider travel plans you have or are making. . If you are planning any travel outside or inside the [place], visit the CDC's Travelers' Health webpage for the latest health notices. . If you have some symptoms but not all symptoms, continue to monitor at home and seek medical attention  . if your symptoms worsen. . If you are having a medical emergency, call 911.   . >>>>>>>>>>>>>>>>>>>>>>>>>>>>>>>>> . We Do NOT Approve of  [facility],  [place] Soliciting Our Patients  To Do Home Visits & We Do NOT Approve of [employer] > > > > > > > > > > > > > > > > > > > > > > > > > > > > > > > > > > > > > > >  Preventive Care for Adults  A healthy lifestyle and preventive care can promote health and wellness. Preventive health guidelines for women include the following key practices.  A routine yearly physical is a good way to check with your health care provider about your health and preventive screening. It is a chance to share any concerns and updates on your health and to receive a thorough exam.  Visit your dentist for a routine exam and preventive care every 6 months. Brush your teeth twice a day and floss once a day. Good oral hygiene prevents tooth decay and gum disease.  The frequency of eye exams is based on your age, health, family medical history, use of contact lenses, and other factors. Follow your health care provider's recommendations for frequency of eye exams.  Eat a healthy diet. Foods like vegetables, fruits, whole grains, low-fat dairy products, and lean protein foods contain the nutrients you need without too many calories. Decrease your intake of foods high in solid fats, added sugars, and salt. Eat the right amount of calories for you. Get information about a proper diet from your health care provider, if necessary.  Regular physical exercise is one of the most important things you can do for your health. Most adults should get at least 150 minutes of moderate-intensity exercise (any activity that increases your heart rate and causes you to sweat) each week. In addition, most adults need muscle-strengthening exercises on 2 or more days a week.  Maintain a healthy weight. The body mass index (BMI) is a screening tool to identify possible weight problems. It provides an estimate of body fat based on height and weight. Your health care provider can find your BMI and can help you achieve or maintain a  healthy weight. For adults 20 years and older:  A BMI below 18.5 is considered underweight.  A BMI of 18.5 to 24.9 is normal.  A BMI of 25 to 29.9 is considered overweight.  A BMI of 30 and above is considered obese.  Maintain normal blood lipids and cholesterol levels by exercising and minimizing your intake of saturated fat. Eat a balanced diet with plenty of fruit and vegetables. If your lipid or cholesterol levels are high, you are over 50, or you are at high risk for heart disease, you may need your cholesterol levels checked more frequently. Ongoing high lipid and cholesterol levels should be treated with medicines if diet and exercise are not working.  If   you smoke, find out from your health care provider how to quit. If you do not use tobacco, do not start.  Lung cancer screening is recommended for adults aged 80-80 years who are at high risk for developing lung cancer because of a history of smoking. A yearly low-dose CT scan of the lungs is recommended for people who have at least a 30-pack-year history of smoking and are a current smoker or have quit within the past 15 years. A pack year of smoking is smoking an average of 1 pack of cigarettes a day for 1 year (for example: 1 pack a day for 30 years or 2 packs a day for 15 years). Yearly screening should continue until the smoker has stopped smoking for at least 15 years. Yearly screening should be stopped for people who develop a health problem that would prevent them from having lung cancer treatment.  Avoid use of street drugs. Do not share needles with anyone. Ask for help if you need support or instructions about stopping the use of drugs.  High blood pressure causes heart disease and increases the risk of stroke.  Ongoing high blood pressure should be treated with medicines if weight loss and exercise do not work.  If you are 70-70 years old, ask your health care provider if you should take aspirin to prevent strokes.  Diabetes  screening involves taking a blood sample to check your fasting blood sugar level. This should be done once every 3 years, after age 75, if you are within normal weight and without risk factors for diabetes. Testing should be considered at a younger age or be carried out more frequently if you are overweight and have at least 1 risk factor for diabetes.  Breast cancer screening is essential preventive care for women. You should practice "breast self-awareness." This means understanding the normal appearance and feel of your breasts and may include breast self-examination. Any changes detected, no matter how small, should be reported to a health care provider. Women in their 107s and 30s should have a clinical breast exam (CBE) by a health care provider as part of a regular health exam every 1 to 3 years. After age 37, women should have a CBE every year. Starting at age 66, women should consider having a mammogram (breast X-ray test) every year. Women who have a family history of breast cancer should talk to their health care provider about genetic screening. Women at a high risk of breast cancer should talk to their health care providers about having an MRI and a mammogram every year.  Breast cancer gene (BRCA)-related cancer risk assessment is recommended for women who have family members with BRCA-related cancers. BRCA-related cancers include breast, ovarian, tubal, and peritoneal cancers. Having family members with these cancers may be associated with an increased risk for harmful changes (mutations) in the breast cancer genes BRCA1 and BRCA2. Results of the assessment will determine the need for genetic counseling and BRCA1 and BRCA2 testing.  Routine pelvic exams to screen for cancer are no longer recommended for nonpregnant women who are considered low risk for cancer of the pelvic organs (ovaries, uterus, and vagina) and who do not have symptoms. Ask your health care provider if a screening pelvic exam is  right for you.  If you have had past treatment for cervical cancer or a condition that could lead to cancer, you need Pap tests and screening for cancer for at least 20 years after your treatment. If Pap tests have been  discontinued, your risk factors (such as having a new sexual partner) need to be reassessed to determine if screening should be resumed. Some women have medical problems that increase the chance of getting cervical cancer. In these cases, your health care provider may recommend more frequent screening and Pap tests.    Colorectal cancer can be detected and often prevented. Most routine colorectal cancer screening begins at the age of 57 years and continues through age 56 years. However, your health care provider may recommend screening at an earlier age if you have risk factors for colon cancer. On a yearly basis, your health care provider may provide home test kits to check for hidden blood in the stool. Use of a small camera at the end of a tube, to directly examine the colon (sigmoidoscopy or colonoscopy), can detect the earliest forms of colorectal cancer. Talk to your health care provider about this at age 75, when routine screening begins.  Direct exam of the colon should be repeated every 5-10 years through age 10 years, unless early forms of pre-cancerous polyps or small growths are found.  Osteoporosis is a disease in which the bones lose minerals and strength with aging. This can result in serious bone fractures or breaks. The risk of osteoporosis can be identified using a bone density scan. Women ages 56 years and over and women at risk for fractures or osteoporosis should discuss screening with their health care providers. Ask your health care provider whether you should take a calcium supplement or vitamin D to reduce the rate of osteoporosis.  Menopause can be associated with physical symptoms and risks. Hormone replacement therapy is available to decrease symptoms and risks.  You should talk to your health care provider about whether hormone replacement therapy is right for you.  Use sunscreen. Apply sunscreen liberally and repeatedly throughout the day. You should seek shade when your shadow is shorter than you. Protect yourself by wearing long sleeves, pants, a wide-brimmed hat, and sunglasses year round, whenever you are outdoors.  Once a month, do a whole body skin exam, using a mirror to look at the skin on your back. Tell your health care provider of new moles, moles that have irregular borders, moles that are larger than a pencil eraser, or moles that have changed in shape or color.  Stay current with required vaccines (immunizations).  Influenza vaccine. All adults should be immunized every year.  Tetanus, diphtheria, and acellular pertussis (Td, Tdap) vaccine. Pregnant women should receive 1 dose of Tdap vaccine during each pregnancy. The dose should be obtained regardless of the length of time since the last dose. Immunization is preferred during the 27th-36th week of gestation. An adult who has not previously received Tdap or who does not know her vaccine status should receive 1 dose of Tdap. This initial dose should be followed by tetanus and diphtheria toxoids (Td) booster doses every 10 years. Adults with an unknown or incomplete history of completing a 3-dose immunization series with Td-containing vaccines should begin or complete a primary immunization series including a Tdap dose. Adults should receive a Td booster every 10 years.    Zoster vaccine. One dose is recommended for adults aged 59 years or older unless certain conditions are present.    Pneumococcal 13-valent conjugate (PCV13) vaccine. When indicated, a person who is uncertain of her immunization history and has no record of immunization should receive the PCV13 vaccine. An adult aged 75 years or older who has certain medical conditions and has  not been previously immunized should receive 1  dose of PCV13 vaccine. This PCV13 should be followed with a dose of pneumococcal polysaccharide (PPSV23) vaccine. The PPSV23 vaccine dose should be obtained at least 1 or more year(s) after the dose of PCV13 vaccine. An adult aged 19 years or older who has certain medical conditions and previously received 1 or more doses of PPSV23 vaccine should receive 1 dose of PCV13. The PCV13 vaccine dose should be obtained 1 or more years after the last PPSV23 vaccine dose.    Pneumococcal polysaccharide (PPSV23) vaccine. When PCV13 is also indicated, PCV13 should be obtained first. All adults aged 65 years and older should be immunized. An adult younger than age 65 years who has certain medical conditions should be immunized. Any person who resides in a nursing home or long-term care facility should be immunized. An adult smoker should be immunized. People with an immunocompromised condition and certain other conditions should receive both PCV13 and PPSV23 vaccines. People with human immunodeficiency virus (HIV) infection should be immunized as soon as possible after diagnosis. Immunization during chemotherapy or radiation therapy should be avoided. Routine use of PPSV23 vaccine is not recommended for American Indians, [place] Natives, or people younger than 65 years unless there are medical conditions that require PPSV23 vaccine. When indicated, people who have unknown immunization and have no record of immunization should receive PPSV23 vaccine. One-time revaccination 5 years after the first dose of PPSV23 is recommended for people aged 19-64 years who have chronic kidney failure, nephrotic syndrome, asplenia, or immunocompromised conditions. People who received 1-2 doses of PPSV23 before age 65 years should receive another dose of PPSV23 vaccine at age 65 years or later if at least 5 years have passed since the previous dose. Doses of PPSV23 are not needed for people immunized with PPSV23 at or after age 65  years.   Preventive Services / Frequency  Ages 65 years and over  Blood pressure check.  Lipid and cholesterol check.  Lung cancer screening. / Every year if you are aged 55-80 years and have a 30-pack-year history of smoking and currently smoke or have quit within the past 15 years. Yearly screening is stopped once you have quit smoking for at least 15 years or develop a health problem that would prevent you from having lung cancer treatment.  Clinical breast exam.** / Every year after age 40 years.   BRCA-related cancer risk assessment.** / For women who have family members with a BRCA-related cancer (breast, ovarian, tubal, or peritoneal cancers).  Mammogram.** / Every year beginning at age 40 years and continuing for as long as you are in good health. Consult with your health care provider.  Pap test.** / Every 3 years starting at age 30 years through age 65 or 70 years with 3 consecutive normal Pap tests. Testing can be stopped between 65 and 70 years with 3 consecutive normal Pap tests and no abnormal Pap or HPV tests in the past 10 years.  Fecal occult blood test (FOBT) of stool. / Every year beginning at age 50 years and continuing until age 75 years. You may not need to do this test if you get a colonoscopy every 10 years.  Flexible sigmoidoscopy or colonoscopy.** / Every 5 years for a flexible sigmoidoscopy or every 10 years for a colonoscopy beginning at age 50 years and continuing until age 75 years.  Hepatitis C blood test.** / For all people born from 1945 through 1965 and any individual with known risks   for hepatitis C.  Osteoporosis screening.** / A one-time screening for women ages 3 years and over and women at risk for fractures or osteoporosis.  Skin self-exam. / Monthly.  Influenza vaccine. / Every year.  Tetanus, diphtheria, and acellular pertussis (Tdap/Td) vaccine.** / 1 dose of Td every 10 years.  Zoster vaccine.** / 1 dose for adults aged 7 years or  older.  Pneumococcal 13-valent conjugate (PCV13) vaccine.** / Consult your health care provider.  Pneumococcal polysaccharide (PPSV23) vaccine.** / 1 dose for all adults aged 82 years and older. Screening for abdominal aortic aneurysm (AAA)  by ultrasound is recommended for people who have history of high blood pressure or who are current or former smokers. ++++++++++++++++++++ Recommend Adult Low Dose Aspirin or  coated  Aspirin 81 mg daily  To reduce risk of Colon Cancer 40 %,  Skin Cancer 26 % ,  Melanoma 46%  and  Pancreatic cancer 60% ++++++++++++++++++++ Vitamin D goal  is between 70-100.  Please make sure that you are taking your Vitamin D as directed.  It is very important as a natural anti-inflammatory  helping hair, skin, and nails, as well as reducing stroke and heart attack risk.  It helps your bones and helps with mood. It also decreases numerous cancer risks so please take it as directed.  Low Vit D is associated with a 200-300% higher risk for CANCER  and 200-300% higher risk for HEART   ATTACK  &  STROKE.   .....................................Marland Kitchen It is also associated with higher death rate at younger ages,  autoimmune diseases like Rheumatoid arthritis, Lupus, Multiple Sclerosis.    Also many other serious conditions, like depression, Alzheimer's Dementia, infertility, muscle aches, fatigue, fibromyalgia - just to name a few. ++++++++++++++++++ Recommend the book "The END of DIETING" by Dr Excell Seltzer  & the book "The END of DIABETES " by Dr Excell Seltzer At [email or web address] - get book & Audio CD's    Being diabetic has a  300% increased risk for heart attack, stroke, cancer, and alzheimer- type vascular dementia. It is very important that you work harder with diet by avoiding all foods that are white. Avoid white rice (brown & wild rice is OK), white potatoes (sweetpotatoes in moderation is OK), White bread or wheat bread or anything made out of white flour like bagels,  donuts, rolls, buns, biscuits, cakes, pastries, cookies, pizza crust, and pasta (made from white flour & egg whites) - vegetarian pasta or spinach or wheat pasta is OK. Multigrain breads like Arnold's or Pepperidge [employer], or multigrain sandwich thins or flatbreads.  Diet, exercise and weight loss can reverse and cure diabetes in the early stages.  Diet, exercise and weight loss is very important in the control and prevention of complications of diabetes which affects every system in your body, ie. Brain - dementia/stroke, eyes - glaucoma/blindness, heart - heart attack/heart failure, kidneys - dialysis, stomach - gastric paralysis, intestines - malabsorption, nerves - severe painful neuritis, circulation - gangrene & loss of a leg(s), and finally cancer and Alzheimers.    I recommend avoid fried & greasy foods,  sweets/candy, white rice (brown or wild rice or Quinoa is OK), white potatoes (sweet potatoes are OK) - anything made from white flour - bagels, doughnuts, rolls, buns, biscuits,white and wheat breads, pizza crust and traditional pasta made of white flour & egg white(vegetarian pasta or spinach or wheat pasta is OK).  Multi-grain bread is OK - like multi-grain flat bread or sandwich thins. Avoid alcohol in  excess. Exercise is also important.    Eat all the vegetables you want - avoid meat, especially red meat and dairy - especially cheese.  Cheese is the most concentrated form of trans-fats which is the worst thing to clog up our arteries. Veggie cheese is OK which can be found in the fresh produce section at [employer] or [employer] or [employer]  +++++++++++++++++++ DASH Eating Plan  DASH stands for "Dietary Approaches to Stop Hypertension."   The DASH eating plan is a healthy eating plan that has been shown to reduce high blood pressure (hypertension). Additional health benefits may include reducing the risk of type 2 diabetes mellitus, heart disease, and stroke. The DASH eating plan may also  help with weight loss. WHAT DO I NEED TO KNOW ABOUT THE DASH EATING PLAN? For the DASH eating plan, you will follow these general guidelines:  Choose foods with a percent daily value for sodium of less than 5% (as listed on the food label).  Use salt-free seasonings or herbs instead of table salt or sea salt.  Check with your health care provider or pharmacist before using salt substitutes.  Eat lower-sodium products, often labeled as "lower sodium" or "no salt added."  Eat fresh foods.  Eat more vegetables, fruits, and low-fat dairy products.  Choose whole grains. Look for the word "whole" as the first word in the ingredient list.  Choose fish   Limit sweets, desserts, sugars, and sugary drinks.  Choose heart-healthy fats.  Eat veggie cheese   Eat more home-cooked food and less restaurant, buffet, and fast food.  Limit fried foods.  Cook foods using methods other than frying.  Limit canned vegetables. If you do use them, rinse them well to decrease the sodium.  When eating at a restaurant, ask that your food be prepared with less salt, or no salt if possible.                      WHAT FOODS CAN I EAT? Read Dr Joel Fuhrman's books on The End of Dieting & The End of Diabetes  Grains Whole grain or whole wheat bread. Brown rice. Whole grain or whole wheat pasta. Quinoa, bulgur, and whole grain cereals. Low-sodium cereals. Corn or whole wheat flour tortillas. Whole grain cornbread. Whole grain crackers. Low-sodium crackers.  Vegetables Fresh or frozen vegetables (raw, steamed, roasted, or grilled). Low-sodium or reduced-sodium tomato and vegetable juices. Low-sodium or reduced-sodium tomato sauce and paste. Low-sodium or reduced-sodium canned vegetables.   Fruits All fresh, canned (in natural juice), or frozen fruits.  Protein Products  All fish and seafood.  Dried beans, peas, or lentils. Unsalted nuts and seeds. Unsalted canned beans.  Dairy Low-fat dairy products,  such as skim or 1% milk, 2% or reduced-fat cheeses, low-fat ricotta or cottage cheese, or plain low-fat yogurt. Low-sodium or reduced-sodium cheeses.  Fats and Oils Tub margarines without trans fats. Light or reduced-fat mayonnaise and salad dressings (reduced sodium). Avocado. Safflower, olive, or canola oils. Natural peanut or almond butter.  Other Unsalted popcorn and pretzels. The items listed above may not be a complete list of recommended foods or beverages. Contact your dietitian for more options.  +++++++++++++++  WHAT FOODS ARE NOT RECOMMENDED? Grains/ White flour or wheat flour White bread. White pasta. White rice. Refined cornbread. Bagels and croissants. Crackers that contain trans fat.  Vegetables  Creamed or fried vegetables. Vegetables in a . Regular canned vegetables. Regular canned tomato sauce and paste. Regular tomato and vegetable   juices.  Fruits Dried fruits. Canned fruit in light or heavy syrup. Fruit juice.  Meat and Other Protein Products Meat in general - RED meat & White meat.  Fatty cuts of meat. Ribs, chicken wings, all processed meats as bacon, sausage, bologna, salami, fatback, hot dogs, bratwurst and packaged luncheon meats.  Dairy Whole or 2% milk, cream, half-and-half, and cream cheese. Whole-fat or sweetened yogurt. Full-fat cheeses or blue cheese. Non-dairy creamers and whipped toppings. Processed cheese, cheese spreads, or cheese curds.  Condiments Onion and garlic salt, seasoned salt, table salt, and sea salt. Canned and packaged gravies. Worcestershire sauce. Tartar sauce. Barbecue sauce. Teriyaki sauce. Soy sauce, including reduced sodium. Steak sauce. Fish sauce. Oyster sauce. Cocktail sauce. Horseradish. Ketchup and mustard. Meat flavorings and tenderizers. Bouillon cubes. Hot sauce. Tabasco sauce. Marinades. Taco seasonings. Relishes.  Fats and Oils Butter, stick margarine, lard, shortening and bacon fat. Coconut, palm kernel, or palm oils.  Regular salad dressings.  Pickles and olives. Salted popcorn and pretzels.  The items listed above may not be a complete list of foods and beverages to avoid.

## 2019-05-13 NOTE — Progress Notes (Signed)
MEDICARE ANNUAL WELLNESS VISIT AND 3 MONTH FOLLOW UP  Assessment:   Diagnoses and all orders for this visit:  Encounter for Medicare annual wellness exam Yearly   Essential hypertension Continue medications Monitor blood pressure at home; call if consistently over 130/80 Continue DASH diet.   Reminder to go to the ER if any CP, SOB, nausea, dizziness, severe HA, changes vision/speech, left arm numbness and tingling and jaw pain.  Hyperlipidemia associated with type II diabetes ([place]) Currently well above goal of LDL <70; atorvastatin was increased to 40 mg daily Continue low cholesterol diet and exercise.  Check lipid panel.   Asthma/ Chronic obstructive pulmonary disease, unspecified COPD type ([place]) No recent exacerbations, has rescue inhaler Well controlled with night time humidifier use Has inhaler to use as needed; hasn't used in a long time Avoid triggers Continue to monitor  Gastroesophageal reflux disease, esophagitis presence not specified Well managed on current medications Discussed diet, avoiding triggers and other lifestyle changes  Type 2 diabetes mellitus with diabetic nephropathy, without long-term current use of insulin [facility]) Education: Reviewed 'ABCs' of diabetes management (respective goals in parentheses):  A1C (<7), blood pressure (<130/80), and cholesterol (LDL <70) Eye Exam yearly and Dental Exam every 6 months. Dietary recommendations Physical Activity recommendations Foot exam done   Gastroesophageal reflux disease, unspecified whether esophagitis present Doing well at this time Continue:  Diet discussed Monitor for triggers Avoid food with high acid content Avoid excessive cafeine Increase water intake  Osteoporosis, unspecified osteoporosis type, unspecified pathological fracture presence Has been on fosamax x 4 years; stopped 2018;  Due for DEXA, will order today to get with mammogram in 11/2019 Continue calcium/vitamin D supplementation,  weight bearing exercises   CKD stage 2 due to type 2 diabetes mellitus (HCC) Increase fluids  Avoid NSAIDS Blood pressure control Monitor sugars  Will continue to monitor  Primary open angle glaucoma (POAG) of both eyes, severe stage Continue current regiment Follows with opthalmology  Insomnia, unspecified type Doing well at this time Continue current regiment Discussed good sleep hygiene, decrease stimulation prior to sleep Increase day time activity Avoid caffeine in evenings  Scoliosis of thoracolumbar spine, unspecified scoliosis type Lumbar spondylosis Doing exercises, tylenol prn Followed by ortho as needed  Vitamin D deficiency At goal at recent check; continue to recommend supplementation for goal of 70-100 Defer vitamin D level  BMI 30.0-30.9,adult Discussed dietary and exercise modifications  Medication management Continued     Over 40 minutes of exam, counseling, chart review and critical decision making was performed Future Appointments  Date Time Provider [place]  08/17/2019 11:00 AM Unk Pinto, MD [facility] None     Plan:   During the course of the visit the patient was educated and counseled about appropriate screening and preventive services including:    Pneumococcal vaccine   Prevnar 13  Influenza vaccine  Td vaccine  Screening electrocardiogram  Bone densitometry screening  Colorectal cancer screening  Diabetes screening  Glaucoma screening  Nutrition counseling   Advanced directives: requested   Subjective:  Sara [name] is a 74 y.o. female who presents for Medicare Annual Wellness Visit and 3 month follow up. She follows with Dr. Patrice Paradise for neck/back pains.   she has a diagnosis of anxiety and is currently on ativan 0.5-1 mg PRN, reports symptoms are well controlled on current regimen. she reports she currently takes 0.5 mg at night to help with sleep and is doing well.   BMI is Body mass index is  30.19  kg/m., she has been working on diet and exercise. Wt Readings from Last 3 Encounters:  05/13/19 181 lb 6.4 oz (82.3 kg)  02/10/19 181 lb 3.2 oz (82.2 kg)  11/10/18 182 lb 12.8 oz (82.9 kg)    Her blood pressure has been controlled at home, today their BP is BP: 110/64 She does workout. She denies chest pain, shortness of breath, dizziness.   She is on cholesterol medication (atorvastatin 40 mg daily) and denies myalgias. Her cholesterol is not at goal. The cholesterol last visit was:   Lab Results  Component Value Date   CHOL 142 02/10/2019   HDL 49 (L) 02/10/2019   LDLCALC 74 02/10/2019   TRIG 100 02/10/2019   CHOLHDL 2.9 02/10/2019    She has been working on diet and exercise for new T2 diabetes, and denies foot ulcerations, increased appetite, nausea, paresthesia of the feet, polydipsia, polyuria, visual disturbances, vomiting and weight loss. She does check fasting values and reports runs about 110. Last A1C in the office was:  Lab Results  Component Value Date   HGBA1C 6.7 (H) 02/10/2019   Last GFR: Lab Results  Component Value Date   GFRAA 99 02/10/2019   Patient is on Vitamin D supplement and at goal at recent check:   Lab Results  Component Value Date   VD25OH 57 02/10/2019      Medication Review: Current Outpatient Medications on File Prior to Visit  Medication Sig Dispense Refill  . Accu-Chek Softclix Lancets lancets CHECK BLOOD GLUCOSE 1 TIME EVERY DAY. Dx-R73.03 100 each 1  . acetaminophen (TYLENOL ARTHRITIS PAIN) 650 MG CR tablet Take 1,300 mg by mouth every 8 (eight) hours as needed. For pain    . albuterol (PROVENTIL) (2.5 MG/3ML) 0.083% nebulizer solution Take 3 mLs (2.5 mg total) by nebulization every 6 (six) hours as needed for wheezing or shortness of breath. 75 mL 12  . Ascorbic Acid (VITAMIN C PO) Take 500 mg by mouth daily.     . baclofen (LIORESAL) 10 MG tablet Take 1/2 to 1 tablet 2 to 3 x /day ONLY if needed for muscle spasm 270 tablet 0  .  benazepril-hydrochlorthiazide (LOTENSIN HCT) 20-25 MG tablet Take 1 tablet Daily for BP 90 tablet 3  . Blood Glucose Monitoring Suppl (ACCU-CHEK AVIVA PLUS) w/Device KIT USE AS DIRECTED  TO CHECK BLOOD SUGAR ONE TIME DAILY.DX-R73.03 1 kit 0  . brimonidine (ALPHAGAN) 0.2 % ophthalmic solution Place 1 drop into both eyes daily.    . Cholecalciferol (VITAMIN D3) 5000 units TABS Take 10,000 Units by mouth daily.     . diclofenac sodium (VOLTAREN) 1 % GEL APPLY  2  TO  4  GRAMS TOPICALLY FOUR TIMES DAILY 100 g 0  . dorzolamide-timolol (COSOPT) 22.3-6.8 MG/ML ophthalmic solution Place 1 drop into both eyes 2 (two) times daily.    . famotidine (PEPCID) 40 MG tablet Take 1 tablet prior to dinner for Acid Reflux 90 tablet 3  . gabapentin (NEURONTIN) 600 MG tablet Take 1/2 to 1 tablet     2 to 3 x  /day if needed for Pain 270 tablet 1  . glucose blood (ACCU-CHEK AVIVA PLUS) test strip Check blood glucose 1 time daily-DX-R73.03 100 each 1  . latanoprost (XALATAN) 0.005 % ophthalmic solution Place 1 drop into both eyes at bedtime.    Marland Kitchen LORazepam (ATIVAN) 1 MG tablet Take 1/2 - 1 tablet 2 - 3 x /day ONLY if needed for Anxiety Attack &  limit to 5  days /week to avoid Addiction & Dementia 30 tablet 0  . metFORMIN (GLUCOPHAGE-XR) 500 MG 24 hr tablet Take 2 tablets 2 x /day with meal for Diabetes 360 tablet 1  . montelukast (SINGULAIR) 10 MG tablet Take 1 tablet (10 mg total) by mouth at bedtime. 30 tablet 0  . Multiple Vitamin (MULTIVITAMIN WITH MINERALS) TABS tablet Take 1 tablet by mouth daily.    . calcium-vitamin D (OSCAL WITH D) 500-200 MG-UNIT tablet Take 1 tablet by mouth daily with breakfast.     No current facility-administered medications on file prior to visit.    Allergies  Allergen Reactions  . Penicillins Hives and Shortness Of Breath  . Aspirin Other (See Comments)    Stomach upset, but low dose is ok  . Hydromorphone Nausea Only    Other reaction(s): Sweating (intolerance)  . Sulfa  Antibiotics Other (See Comments)    unknown  . Codeine Rash    Current Problems (verified) Patient Active Problem List   Diagnosis Date Noted  . Insomnia 07/23/2017  . S/P cervical spinal fusion 07/22/2017  . Osteoporosis 04/14/2017  . Overweight (BMI 25.0-29.9) 01/04/2015  . 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 Vitamin D deficiency 06/04/2013  . Medication management 06/04/2013  . Type II diabetes mellitus with renal manifestations ([place])   . GERD (gastroesophageal reflux disease)   . Glaucoma   . Hyperlipidemia associated with type 2 diabetes mellitus ([place])   . Hypertension   . COPD (chronic obstructive pulmonary disease) ([place])   . Asthma   . Cervical arthritis   . Lumbar spondylosis 03/27/2012  . Disorder of sacroiliac joint 05/31/2011    Screening Tests Immunization History  Administered Date(s) Administered  . Influenza, High Dose Seasonal PF 01/04/2015, 12/31/2016, 12/05/2017, 02/10/2019  . Influenza-Unspecified 03/01/2016  . Pneumococcal Conjugate-13 11/16/2013  . Pneumococcal Polysaccharide-23 04/12/2015  . Pneumococcal-Unspecified 03/18/2005  . Tdap 03/18/2009  . Zoster 11/13/2012   Preventative care: Last colonoscopy: 2009 - has gotten letter, will schedule MGM 11/2018 Dexa 01/2017 -has been on fosamax 2014~2018 MRI neck 2017 CXR 2015 CT AB 2011 diverticulitis  Prior vaccinations: TD or Tdap: 2011  Influenza: 2018 Pneumococcal: 2017 Prevnar13: 2015 Shingles/Zostavax: 2014  Names of Other Physician/Practitioners you currently use: 1. [facility] here for primary care 2. [facility] Dr. Lenna Gilford,  last visit 01/07/2017 - abstracted 3. Dr. Stan Head at [facility] for glaucoma.  4. [facility] Pharmacist, community, last visit 2019  Patient Care Team: Unk Pinto, MD as PCP - General (Internal Medicine) Normajean Glasgow, MD as Attending Physician (Physical Medicine and Rehabilitation) Phylliss Bob,  MD as Consulting Physician (Orthopedic Surgery) Juanita Craver, MD as Consulting Physician (Gastroenterology)  SURGICAL HISTORY She  has a past surgical history that includes Carpal tunnel release (2012); Wrist surgery; Breast surgery (20+yrs ago); Tonsillectomy (30+yrs ago); Abdominal hysterectomy (1975); Refractive surgery (2011); Colonoscopy; Anterior cervical decomp/discectomy fusion (07/31/2011); Colonoscopy with propofol (N/A, 10/31/2017); biopsy (10/31/2017); and Breast lumpectomy (Left). FAMILY HISTORY Her family history includes Cancer (age of onset: 53) in her mother; Diabetes in her sister; Glaucoma in her father; HIV in her son; Hypertension in her sister; Leukemia in her father; Mitral valve prolapse in her sister; Pneumonia in her father; Pulmonary embolism in her sister. SOCIAL HISTORY She  reports that she has never smoked. She has never used smokeless tobacco. She reports that she does not drink alcohol or use drugs.   MEDICARE WELLNESS OBJECTIVES: Physical activity: Current Exercise  Habits: Home exercise routine, Type of exercise: walking, Time (Minutes): 20, Frequency (Times/Week): 3, Weekly Exercise (Minutes/Week): 60, Intensity: Mild, Exercise limited by: None identified Cardiac risk factors: Cardiac Risk Factors include: advanced age (>12mn, >>83women);dyslipidemia;diabetes mellitus;sedentary lifestyle;hypertension Depression/mood screen:   [facility] 05/13/2019  Decreased Interest 0  Down, Depressed, Hopeless 0  PHQ - 2 Score 0  Some recent data might be hidden    ADLs:  In your present state of health, do you have any difficulty performing the following activities: 05/13/2019 07/29/2018  Hearing? N N  Vision? N N  Difficulty concentrating or making decisions? N N  Walking or climbing stairs? N N  Dressing or bathing? N N  Doing errands, shopping? N N  Preparing Food and eating ? N -  Using the Toilet? N -  In the past six months, have you accidently  leaked urine? N -  Do you have problems with loss of bowel control? N -  Managing your Medications? N -  Managing your Finances? N -  Housekeeping or managing your Housekeeping? N -  Some recent data might be hidden     Cognitive Testing  Alert? Yes  Normal Appearance?Yes  Oriented to person? Yes  Place? Yes   Time? Yes  Recall of three objects?  Yes  Can perform simple calculations? Yes  Displays appropriate judgment?Yes  Can read the correct time from a watch face?Yes  EOL planning: Does Patient Have a Medical Advance Directive?: Yes Type of Advance Directive: Healthcare Power of Attorney, Living will Does patient want to make changes to medical advance directive?: No - Patient declined Copy of [place] Chart?: No - copy requested  Review of Systems  Constitutional: Negative for malaise/fatigue and weight loss.  HENT: Negative for hearing loss and tinnitus.   Eyes: Negative for blurred vision and double vision.  Respiratory: Negative for cough, sputum production, shortness of breath and wheezing.   Cardiovascular: Negative for chest pain, palpitations, orthopnea, claudication, leg swelling and PND.  Gastrointestinal: Negative for abdominal pain, blood in stool, constipation, diarrhea, heartburn, melena, nausea and vomiting.  Genitourinary: Negative.   Musculoskeletal: Negative for falls, joint pain and myalgias.  Skin: Negative for rash.  Neurological: Negative for dizziness, tingling, sensory change, weakness and headaches.  Endo/Heme/Allergies: Negative for polydipsia.  Psychiatric/Behavioral: Negative.  Negative for depression, memory loss, substance abuse and suicidal ideas. The patient is not nervous/anxious and does not have insomnia.   All other systems reviewed and are negative.    Objective:     Today's Vitals   05/13/19 1026  BP: 110/64  Pulse: 75  Temp: (!) 97.5 F (36.4 C)  SpO2: 99%  Weight: 181 lb 6.4 oz (82.3 kg)  Height: 5' 5"   (1.651 m)   Body mass index is 30.19 kg/m.  General appearance: alert, no distress, WD/WN, female HEENT: normocephalic, sclerae anicteric, TMs pearly, nares patent, no discharge or erythema, pharynx normal Oral cavity: MMM, no lesions Neck: supple, no lymphadenopathy, no thyromegaly, no masses Heart: RRR, normal S1, S2, no murmurs Lungs: CTA bilaterally, no wheezes, rhonchi, or rales Abdomen: +bs, soft, non tender, non distended, no masses, no hepatomegaly, no splenomegaly Musculoskeletal: nontender, no swelling, no obvious deformity Extremities: no edema, no cyanosis, no clubbing Pulses: 2+ symmetric, upper and lower extremities, normal cap refill Neurological: alert, oriented x 3, CN2-12 intact, strength normal upper extremities and lower extremities, sensation normal throughout, DTRs 2+ throughout, no cerebellar signs, gait normal Psychiatric: normal affect, behavior  normal, pleasant   Medicare Attestation I have personally reviewed: The patient's medical and social history Their use of alcohol, tobacco or illicit drugs Their current medications and supplements The patient's functional ability including ADLs,fall risks, home safety risks, cognitive, and hearing and visual impairment Diet and physical activities Evidence for depression or mood disorders  The patient's weight, height, BMI, and visual acuity have been recorded in the chart.  I have made referrals, counseling, and provided education to the patient based on review of the above and I have provided the patient with a written personalized care plan for preventive services.     Garnet Sierras, NP   05/13/2019

## 2019-05-14 LAB — HEMOGLOBIN A1C
Hgb A1c MFr Bld: 6.8 % of total Hgb — ABNORMAL HIGH (ref <5.7)
Mean Plasma Glucose: 148 (calc)
eAG (mmol/L): 8.2 (calc)

## 2019-05-14 LAB — CBC WITH DIFFERENTIAL/PLATELET
Absolute Monocytes: 277 cells/uL (ref 200–950)
Basophils Absolute: 30 cells/uL (ref 0–200)
Basophils Relative: 0.8 %
Eosinophils Absolute: 141 cells/uL (ref 15–500)
Eosinophils Relative: 3.7 %
HCT: 37.8 % (ref 35.0–45.0)
Hemoglobin: 12.4 g/dL (ref 11.7–15.5)
Lymphs Abs: 1626 cells/uL (ref 850–3900)
MCH: 29.8 pg (ref 27.0–33.0)
MCHC: 32.8 g/dL (ref 32.0–36.0)
MCV: 90.9 fL (ref 80.0–100.0)
MPV: 9.9 fL (ref 7.5–12.5)
Monocytes Relative: 7.3 %
Neutro Abs: 1725 cells/uL (ref 1500–7800)
Neutrophils Relative %: 45.4 %
Platelets: 251 10*3/uL (ref 140–400)
RBC: 4.16 10*6/uL (ref 3.80–5.10)
RDW: 13 % (ref 11.0–15.0)
Total Lymphocyte: 42.8 %
WBC: 3.8 10*3/uL (ref 3.8–10.8)

## 2019-05-14 LAB — LIPID PANEL
Cholesterol: 168 mg/dL
HDL: 50 mg/dL
LDL Cholesterol (Calc): 94 mg/dL
Non-HDL Cholesterol (Calc): 118 mg/dL
Total CHOL/HDL Ratio: 3.4 (calc)
Triglycerides: 147 mg/dL

## 2019-05-14 LAB — COMPLETE METABOLIC PANEL WITHOUT GFR
AG Ratio: 1.8 (calc) (ref 1.0–2.5)
ALT: 24 U/L (ref 6–29)
AST: 19 U/L (ref 10–35)
Albumin: 4.4 g/dL (ref 3.6–5.1)
Alkaline phosphatase (APISO): 64 U/L (ref 37–153)
BUN: 13 mg/dL (ref 7–25)
CO2: 32 mmol/L (ref 20–32)
Calcium: 10.1 mg/dL (ref 8.6–10.4)
Chloride: 103 mmol/L (ref 98–110)
Creat: 0.72 mg/dL (ref 0.60–0.93)
GFR, Est African American: 98 mL/min/1.73m2
GFR, Est Non African American: 84 mL/min/1.73m2
Globulin: 2.5 g/dL (ref 1.9–3.7)
Glucose, Bld: 106 mg/dL — ABNORMAL HIGH (ref 65–99)
Potassium: 4.2 mmol/L (ref 3.5–5.3)
Sodium: 139 mmol/L (ref 135–146)
Total Bilirubin: 0.4 mg/dL (ref 0.2–1.2)
Total Protein: 6.9 g/dL (ref 6.1–8.1)

## 2019-05-18 ENCOUNTER — Other Ambulatory Visit: Payer: Self-pay | Admitting: Adult Health

## 2019-06-07 DIAGNOSIS — Z03818 Encounter for observation for suspected exposure to other biological agents ruled out: Secondary | ICD-10-CM | POA: Diagnosis not present

## 2019-07-23 ENCOUNTER — Other Ambulatory Visit: Payer: Self-pay | Admitting: Internal Medicine

## 2019-07-23 ENCOUNTER — Other Ambulatory Visit: Payer: Self-pay | Admitting: Adult Health Nurse Practitioner

## 2019-07-23 DIAGNOSIS — M533 Sacrococcygeal disorders, not elsewhere classified: Secondary | ICD-10-CM

## 2019-07-23 DIAGNOSIS — Z1231 Encounter for screening mammogram for malignant neoplasm of breast: Secondary | ICD-10-CM

## 2019-07-23 DIAGNOSIS — M47816 Spondylosis without myelopathy or radiculopathy, lumbar region: Secondary | ICD-10-CM

## 2019-07-23 DIAGNOSIS — M810 Age-related osteoporosis without current pathological fracture: Secondary | ICD-10-CM

## 2019-07-26 ENCOUNTER — Ambulatory Visit: Payer: Medicare Other | Attending: Internal Medicine

## 2019-07-26 DIAGNOSIS — Z20822 Contact with and (suspected) exposure to covid-19: Secondary | ICD-10-CM

## 2019-07-27 LAB — NOVEL CORONAVIRUS, NAA: SARS-CoV-2, NAA: NOT DETECTED

## 2019-07-27 LAB — SARS-COV-2, NAA 2 DAY TAT

## 2019-08-02 ENCOUNTER — Encounter: Payer: Medicare Other | Admitting: Internal Medicine

## 2019-08-16 ENCOUNTER — Encounter: Payer: Self-pay | Admitting: Internal Medicine

## 2019-08-16 NOTE — Patient Instructions (Signed)
Due to recent changes in healthcare laws, you may see the results of your imaging and laboratory studies on MyChart before your provider has had a chance to review them.  We understand that in some cases there may be results that are confusing or concerning to you. Not all laboratory results come back in the same time frame and the provider may be waiting for multiple results in order to interpret others.  Please give [place] 48 hours in order for your provider to thoroughly review all the results before contacting the office for clarification of your results.   +++++++++++++++++++++++++  Vit D  & Vit C 1,000 mg   are recommended to help protect  against the Covid-19 and other Corona viruses.    Also it's recommended  to take  Zinc 50 mg  to help  protect against the Covid-19   and best place to get  is also on [email or web address]  and don't pay more than 6-8 cents /pill !  ================================ Coronavirus (COVID-19) Are you at risk?  Are you at risk for the Coronavirus (COVID-19)?  To be considered HIGH RISK for Coronavirus (COVID-19), you have to meet the following criteria:  . Traveled to [place], [place], [place], [place] or [place]; or in the [place] to [place], [place], [place]  . or [place]; and have fever, cough, and shortness of breath within the last 2 weeks of travel OR . Been in close contact with a person diagnosed with COVID-19 within the last 2 weeks and have  . fever, cough,and shortness of breath .  . IF YOU DO NOT MEET THESE CRITERIA, YOU ARE CONSIDERED LOW RISK FOR COVID-19.  What to do if you are HIGH RISK for COVID-19?  Marland Kitchen If you are having a medical emergency, call 911. . Seek medical care right away. Before you go to a doctor's office, urgent care or emergency department, .  call ahead and tell them about your recent travel, contact with someone diagnosed with COVID-19  .  and your symptoms.  . You should receive instructions from your physician's  office regarding next steps of care.  . When you arrive at healthcare provider, tell the healthcare staff immediately you have returned from  . visiting [place], [place], [place], [place] or [place]; or traveled in the [place] to [place], [place],  . [place] or [place] in the last two weeks or you have been in close contact with a person diagnosed with  . COVID-19 in the last 2 weeks.   . Tell the health care staff about your symptoms: fever, cough and shortness of breath. . After you have been seen by a medical provider, you will be either: o Tested for (COVID-19) and discharged home on quarantine except to seek medical care if  o symptoms worsen, and asked to  - Stay home and avoid contact with others until you get your results (4-5 days)  - Avoid travel on public transportation if possible (such as bus, train, or airplane) or o Sent to the Emergency Department by EMS for evaluation, COVID-19 testing  and  o possible admission depending on your condition and test results.  What to do if you are LOW RISK for COVID-19?  Reduce your risk of any infection by using the same precautions used for avoiding the common cold or flu:  Marland Kitchen Wash your hands often with soap and warm water for at least 20 seconds.  If soap and water are not readily  available,  . use an alcohol-based hand sanitizer with at least 60% alcohol.  . If coughing or sneezing, cover your mouth and nose by coughing or sneezing into the elbow areas of your shirt or coat, .  into a tissue or into your sleeve (not your hands). . Avoid shaking hands with others and consider head nods or verbal greetings only. . Avoid touching your eyes, nose, or mouth with unwashed hands.  . Avoid close contact with people who are sick. . Avoid places or events with large numbers of people in one location, like concerts or sporting events. . Carefully consider travel plans you have or are making. . If you are planning any travel outside or  inside the [place], visit the CDC's Travelers' Health webpage for the latest health notices. . If you have some symptoms but not all symptoms, continue to monitor at home and seek medical attention  . if your symptoms worsen. . If you are having a medical emergency, call 911.   . >>>>>>>>>>>>>>>>>>>>>>>>>>>>>>>>> . We Do NOT Approve of  [facility], [employer]  Our Patients  To Do Home Visits & We Do NOT Approve of [employer] > > > > > > > > > > > > > > > > > > > > > > > > > > > > > > > > > > > > > > >  Preventive Care for Adults  A healthy lifestyle and preventive care can promote health and wellness. Preventive health guidelines for women include the following key practices.  A routine yearly physical is a good way to check with your health care provider about your health and preventive screening. It is a chance to share any concerns and updates on your health and to receive a thorough exam.  Visit your dentist for a routine exam and preventive care every 6 months. Brush your teeth twice a day and floss once a day. Good oral hygiene prevents tooth decay and gum disease.  The frequency of eye exams is based on your age, health, family medical history, use of contact lenses, and other factors. Follow your health care provider's recommendations for frequency of eye exams.  Eat a healthy diet. Foods like vegetables, fruits, whole grains, low-fat dairy products, and lean protein foods contain the nutrients you need without too many calories. Decrease your intake of foods high in solid fats, added sugars, and salt. Eat the right amount of calories for you. Get information about a proper diet from your health care provider, if necessary.  Regular physical exercise is one of the most important things you can do for your health. Most adults should get at least 150 minutes of moderate-intensity exercise (any activity that increases your heart rate and causes you to sweat) each  week. In addition, most adults need muscle-strengthening exercises on 2 or more days a week.  Maintain a healthy weight. The body mass index (BMI) is a screening tool to identify possible weight problems. It provides an estimate of body fat based on height and weight. Your health care provider can find your BMI and can help you achieve or maintain a healthy weight. For adults 20 years and older:  A BMI below 18.5 is considered underweight.  A BMI of 18.5 to 24.9 is normal.  A BMI of 25 to 29.9 is considered overweight.  A BMI of 30 and above is considered obese.  Maintain normal blood lipids and cholesterol levels by exercising and minimizing your intake of  saturated fat. Eat a balanced diet with plenty of fruit and vegetables. If your lipid or cholesterol levels are high, you are over 50, or you are at high risk for heart disease, you may need your cholesterol levels checked more frequently. Ongoing high lipid and cholesterol levels should be treated with medicines if diet and exercise are not working.  If you smoke, find out from your health care provider how to quit. If you do not use tobacco, do not start.  Lung cancer screening is recommended for adults aged 35-80 years who are at high risk for developing lung cancer because of a history of smoking. A yearly low-dose CT scan of the lungs is recommended for people who have at least a 30-pack-year history of smoking and are a current smoker or have quit within the past 15 years. A pack year of smoking is smoking an average of 1 pack of cigarettes a day for 1 year (for example: 1 pack a day for 30 years or 2 packs a day for 15 years). Yearly screening should continue until the smoker has stopped smoking for at least 15 years. Yearly screening should be stopped for people who develop a health problem that would prevent them from having lung cancer treatment.  Avoid use of street drugs. Do not share needles with anyone. Ask for help if you need  support or instructions about stopping the use of drugs.  High blood pressure causes heart disease and increases the risk of stroke.  Ongoing high blood pressure should be treated with medicines if weight loss and exercise do not work.  If you are 49-55 years old, ask your health care provider if you should take aspirin to prevent strokes.  Diabetes screening involves taking a blood sample to check your fasting blood sugar level. This should be done once every 3 years, after age 15, if you are within normal weight and without risk factors for diabetes. Testing should be considered at a younger age or be carried out more frequently if you are overweight and have at least 1 risk factor for diabetes.  Breast cancer screening is essential preventive care for women. You should practice "breast self-awareness." This means understanding the normal appearance and feel of your breasts and may include breast self-examination. Any changes detected, no matter how small, should be reported to a health care provider. Women in their 2s and 30s should have a clinical breast exam (CBE) by a health care provider as part of a regular health exam every 1 to 3 years. After age 16, women should have a CBE every year. Starting at age 16, women should consider having a mammogram (breast X-ray test) every year. Women who have a family history of breast cancer should talk to their health care provider about genetic screening. Women at a high risk of breast cancer should talk to their health care providers about having an MRI and a mammogram every year.  Breast cancer gene (BRCA)-related cancer risk assessment is recommended for women who have family members with BRCA-related cancers. BRCA-related cancers include breast, ovarian, tubal, and peritoneal cancers. Having family members with these cancers may be associated with an increased risk for harmful changes (mutations) in the breast cancer genes BRCA1 and BRCA2. Results of the  assessment will determine the need for genetic counseling and BRCA1 and BRCA2 testing.  Routine pelvic exams to screen for cancer are no longer recommended for nonpregnant women who are considered low risk for cancer of the pelvic organs (ovaries,  uterus, and vagina) and who do not have symptoms. Ask your health care provider if a screening pelvic exam is right for you.  If you have had past treatment for cervical cancer or a condition that could lead to cancer, you need Pap tests and screening for cancer for at least 20 years after your treatment. If Pap tests have been discontinued, your risk factors (such as having a new sexual partner) need to be reassessed to determine if screening should be resumed. Some women have medical problems that increase the chance of getting cervical cancer. In these cases, your health care provider may recommend more frequent screening and Pap tests.    Colorectal cancer can be detected and often prevented. Most routine colorectal cancer screening begins at the age of 62 years and continues through age 28 years. However, your health care provider may recommend screening at an earlier age if you have risk factors for colon cancer. On a yearly basis, your health care provider may provide home test kits to check for hidden blood in the stool. Use of a small camera at the end of a tube, to directly examine the colon (sigmoidoscopy or colonoscopy), can detect the earliest forms of colorectal cancer. Talk to your health care provider about this at age 23, when routine screening begins.  Direct exam of the colon should be repeated every 5-10 years through age 31 years, unless early forms of pre-cancerous polyps or small growths are found.  Osteoporosis is a disease in which the bones lose minerals and strength with aging. This can result in serious bone fractures or breaks. The risk of osteoporosis can be identified using a bone density scan. Women ages 83 years and over and women  at risk for fractures or osteoporosis should discuss screening with their health care providers. Ask your health care provider whether you should take a calcium supplement or vitamin D to reduce the rate of osteoporosis.  Menopause can be associated with physical symptoms and risks. Hormone replacement therapy is available to decrease symptoms and risks. You should talk to your health care provider about whether hormone replacement therapy is right for you.  Use sunscreen. Apply sunscreen liberally and repeatedly throughout the day. You should seek shade when your shadow is shorter than you. Protect yourself by wearing long sleeves, pants, a wide-brimmed hat, and sunglasses year round, whenever you are outdoors.  Once a month, do a whole body skin exam, using a mirror to look at the skin on your back. Tell your health care provider of new moles, moles that have irregular borders, moles that are larger than a pencil eraser, or moles that have changed in shape or color.  Stay current with required vaccines (immunizations).  Influenza vaccine. All adults should be immunized every year.  Tetanus, diphtheria, and acellular pertussis (Td, Tdap) vaccine. Pregnant women should receive 1 dose of Tdap vaccine during each pregnancy. The dose should be obtained regardless of the length of time since the last dose. Immunization is preferred during the 27th-36th week of gestation. An adult who has not previously received Tdap or who does not know her vaccine status should receive 1 dose of Tdap. This initial dose should be followed by tetanus and diphtheria toxoids (Td) booster doses every 10 years. Adults with an unknown or incomplete history of completing a 3-dose immunization series with Td-containing vaccines should begin or complete a primary immunization series including a Tdap dose. Adults should receive a Td booster every 10 years.  Zoster vaccine. One dose is recommended for adults aged 64 years or  older unless certain conditions are present.    Pneumococcal 13-valent conjugate (PCV13) vaccine. When indicated, a person who is uncertain of her immunization history and has no record of immunization should receive the PCV13 vaccine. An adult aged 61 years or older who has certain medical conditions and has not been previously immunized should receive 1 dose of PCV13 vaccine. This PCV13 should be followed with a dose of pneumococcal polysaccharide (PPSV23) vaccine. The PPSV23 vaccine dose should be obtained at least 1 or more year(s) after the dose of PCV13 vaccine. An adult aged 46 years or older who has certain medical conditions and previously received 1 or more doses of PPSV23 vaccine should receive 1 dose of PCV13. The PCV13 vaccine dose should be obtained 1 or more years after the last PPSV23 vaccine dose.    Pneumococcal polysaccharide (PPSV23) vaccine. When PCV13 is also indicated, PCV13 should be obtained first. All adults aged 58 years and older should be immunized. An adult younger than age 1 years who has certain medical conditions should be immunized. Any person who resides in a nursing home or long-term care facility should be immunized. An adult smoker should be immunized. People with an immunocompromised condition and certain other conditions should receive both PCV13 and PPSV23 vaccines. People with human immunodeficiency virus (HIV) infection should be immunized as soon as possible after diagnosis. Immunization during chemotherapy or radiation therapy should be avoided. Routine use of PPSV23 vaccine is not recommended for American Indians, [place] Natives, or people younger than 65 years unless there are medical conditions that require PPSV23 vaccine. When indicated, people who have unknown immunization and have no record of immunization should receive PPSV23 vaccine. One-time revaccination 5 years after the first dose of PPSV23 is recommended for people aged 19-64 years who have chronic  kidney failure, nephrotic syndrome, asplenia, or immunocompromised conditions. People who received 1-2 doses of PPSV23 before age 11 years should receive another dose of PPSV23 vaccine at age 70 years or later if at least 5 years have passed since the previous dose. Doses of PPSV23 are not needed for people immunized with PPSV23 at or after age 65 years.   Preventive Services / Frequency  Ages 56 years and over  Blood pressure check.  Lipid and cholesterol check.  Lung cancer screening. / Every year if you are aged 16-80 years and have a 30-pack-year history of smoking and currently smoke or have quit within the past 15 years. Yearly screening is stopped once you have quit smoking for at least 15 years or develop a health problem that would prevent you from having lung cancer treatment.  Clinical breast exam.** / Every year after age 59 years.   BRCA-related cancer risk assessment.** / For women who have family members with a BRCA-related cancer (breast, ovarian, tubal, or peritoneal cancers).  Mammogram.** / Every year beginning at age 70 years and continuing for as long as you are in good health. Consult with your health care provider.  Pap test.** / Every 3 years starting at age 32 years through age 28 or 9 years with 3 consecutive normal Pap tests. Testing can be stopped between 65 and 70 years with 3 consecutive normal Pap tests and no abnormal Pap or HPV tests in the past 10 years.  Fecal occult blood test (FOBT) of stool. / Every year beginning at age 57 years and continuing until age 29 years. You may not need to  do this test if you get a colonoscopy every 10 years.  Flexible sigmoidoscopy or colonoscopy.** / Every 5 years for a flexible sigmoidoscopy or every 10 years for a colonoscopy beginning at age 69 years and continuing until age 68 years.  Hepatitis C blood test.** / For all people born from [birth year] through 1963 and any individual with known risks for hepatitis  C.  Osteoporosis screening.** / A one-time screening for women ages 57 years and over and women at risk for fractures or osteoporosis.  Skin self-exam. / Monthly.  Influenza vaccine. / Every year.  Tetanus, diphtheria, and acellular pertussis (Tdap/Td) vaccine.** / 1 dose of Td every 10 years.  Zoster vaccine.** / 1 dose for adults aged 17 years or older.  Pneumococcal 13-valent conjugate (PCV13) vaccine.** / Consult your health care provider.  Pneumococcal polysaccharide (PPSV23) vaccine.** / 1 dose for all adults aged 62 years and older. Screening for abdominal aortic aneurysm (AAA)  by ultrasound is recommended for people who have history of high blood pressure or who are current or former smokers. ++++++++++++++++++++ Recommend Adult Low Dose Aspirin or  coated  Aspirin 81 mg daily  To reduce risk of Colon Cancer 40 %,  Skin Cancer 26 % ,  Melanoma 46%  and  Pancreatic cancer 60% ++++++++++++++++++++ Vitamin D goal  is between 70-100.  Please make sure that you are taking your Vitamin D as directed.  It is very important as a natural anti-inflammatory  helping hair, skin, and nails, as well as reducing stroke and heart attack risk.  It helps your bones and helps with mood. It also decreases numerous cancer risks so please take it as directed.  Low Vit D is associated with a 200-300% higher risk for CANCER  and 200-300% higher risk for HEART   ATTACK  &  STROKE.   .....................................Marland Kitchen It is also associated with higher death rate at younger ages,  autoimmune diseases like Rheumatoid arthritis, Lupus, Multiple Sclerosis.    Also many other serious conditions, like depression, Alzheimer's Dementia, infertility, muscle aches, fatigue, fibromyalgia - just to name a few. ++++++++++++++++++ Recommend the book "The END of DIETING" by Dr Excell Seltzer  & the book "The END of DIABETES " by Dr Excell Seltzer At [email or web address] - get book & Audio CD's    Being diabetic has  a  300% increased risk for heart attack, stroke, cancer, and alzheimer- type vascular dementia. It is very important that you work harder with diet by avoiding all foods that are white. Avoid white rice (brown & wild rice is OK), white potatoes (sweetpotatoes in moderation is OK), White bread or wheat bread or anything made out of white flour like bagels, donuts, rolls, buns, biscuits, cakes, pastries, cookies, pizza crust, and pasta (made from white flour & egg whites) - vegetarian pasta or spinach or wheat pasta is OK. Multigrain breads like Arnold's or [employer], or multigrain sandwich thins or flatbreads.  Diet, exercise and weight loss can reverse and cure diabetes in the early stages.  Diet, exercise and weight loss is very important in the control and prevention of complications of diabetes which affects every system in your body, ie. Brain - dementia/stroke, eyes - glaucoma/blindness, heart - heart attack/heart failure, kidneys - dialysis, stomach - gastric paralysis, intestines - malabsorption, nerves - severe painful neuritis, circulation - gangrene & loss of a leg(s), and finally cancer and Alzheimers.    I recommend avoid fried & greasy foods,  sweets/candy, white rice (brown or wild  rice or Quinoa is OK), white potatoes (sweet potatoes are OK) - anything made from white flour - bagels, doughnuts, rolls, buns, biscuits,white and wheat breads, pizza crust and traditional pasta made of white flour & egg white(vegetarian pasta or spinach or wheat pasta is OK).  Multi-grain bread is OK - like multi-grain flat bread or sandwich thins. Avoid alcohol in excess. Exercise is also important.    Eat all the vegetables you want - avoid meat, especially red meat and dairy - especially cheese.  Cheese is the most concentrated form of trans-fats which is the worst thing to clog up our arteries. Veggie cheese is OK which can be found in the fresh produce section at [employer] or [employer] or  [employer]  +++++++++++++++++++ DASH Eating Plan  DASH stands for "Dietary Approaches to Stop Hypertension."   The DASH eating plan is a healthy eating plan that has been shown to reduce high blood pressure (hypertension). Additional health benefits may include reducing the risk of type 2 diabetes mellitus, heart disease, and stroke. The DASH eating plan may also help with weight loss. WHAT DO I NEED TO KNOW ABOUT THE DASH EATING PLAN? For the DASH eating plan, you will follow these general guidelines:  Choose foods with a percent daily value for sodium of less than 5% (as listed on the food label).  Use salt-free seasonings or herbs instead of table salt or sea salt.  Check with your health care provider or pharmacist before using salt substitutes.  Eat lower-sodium products, often labeled as "lower sodium" or "no salt added."  Eat fresh foods.  Eat more vegetables, fruits, and low-fat dairy products.  Choose whole grains. Look for the word "whole" as the first word in the ingredient list.  Choose fish   Limit sweets, desserts, sugars, and sugary drinks.  Choose heart-healthy fats.  Eat veggie cheese   Eat more home-cooked food and less restaurant, buffet, and fast food.  Limit fried foods.  Cook foods using methods other than frying.  Limit canned vegetables. If you do use them, rinse them well to decrease the sodium.  When eating at a restaurant, ask that your food be prepared with less salt, or no salt if possible.                      WHAT FOODS CAN I EAT? Read Dr Fara Olden Fuhrman's books on The End of Dieting & The End of Diabetes  Grains Whole grain or whole wheat bread. Brown rice. Whole grain or whole wheat pasta. Quinoa, bulgur, and whole grain cereals. Low-sodium cereals. Corn or whole wheat flour tortillas. Whole grain cornbread. Whole grain crackers. Low-sodium crackers.  Vegetables Fresh or frozen vegetables (raw, steamed, roasted, or grilled). Low-sodium or  reduced-sodium tomato and vegetable juices. Low-sodium or reduced-sodium tomato sauce and paste. Low-sodium or reduced-sodium canned vegetables.   Fruits All fresh, canned (in natural juice), or frozen fruits.  Protein Products  All fish and seafood.  Dried beans, peas, or lentils. Unsalted nuts and seeds. Unsalted canned beans.  Dairy Low-fat dairy products, such as skim or 1% milk, 2% or reduced-fat cheeses, low-fat ricotta or cottage cheese, or plain low-fat yogurt. Low-sodium or reduced-sodium cheeses.  Fats and Oils Tub margarines without trans fats. Light or reduced-fat mayonnaise and salad dressings (reduced sodium). Avocado. Safflower, olive, or canola oils. Natural peanut or almond butter.  Other Unsalted popcorn and pretzels. The items listed above may not be a complete list of recommended foods  or beverages. Contact your dietitian for more options.  +++++++++++++++  WHAT FOODS ARE NOT RECOMMENDED? Grains/ White flour or wheat flour White bread. White pasta. White rice. Refined cornbread. Bagels and croissants. Crackers that contain trans fat.  Vegetables  Creamed or fried vegetables. Vegetables in a . Regular canned vegetables. Regular canned tomato sauce and paste. Regular tomato and vegetable juices.  Fruits Dried fruits. Canned fruit in light or heavy syrup. Fruit juice.  Meat and Other Protein Products Meat in general - RED meat & White meat.  Fatty cuts of meat. Ribs, chicken wings, all processed meats as bacon, sausage, bologna, salami, fatback, hot dogs, bratwurst and packaged luncheon meats.  Dairy Whole or 2% milk, cream, half-and-half, and cream cheese. Whole-fat or sweetened yogurt. Full-fat cheeses or blue cheese. Non-dairy creamers and whipped toppings. Processed cheese, cheese spreads, or cheese curds.  Condiments Onion and garlic salt, seasoned salt, table salt, and sea salt. Canned and packaged gravies. Worcestershire sauce. Tartar sauce. Barbecue  sauce. Teriyaki sauce. Soy sauce, including reduced sodium. Steak sauce. Fish sauce. Oyster sauce. Cocktail sauce. Horseradish. Ketchup and mustard. Meat flavorings and tenderizers. Bouillon cubes. Hot sauce. Tabasco sauce. Marinades. Taco seasonings. Relishes.  Fats and Oils Butter, stick margarine, lard, shortening and bacon fat. Coconut, palm kernel, or palm oils. Regular salad dressings.  Pickles and olives. Salted popcorn and pretzels.  The items listed above may not be a complete list of foods and beverages to avoid.

## 2019-08-16 NOTE — Progress Notes (Signed)
Annual Screening/Preventative Visit & Comprehensive Evaluation &  Examination     This very nice 74 y.o. DBF  presents for a Screening /Preventative Visit & comprehensive evaluation and management of multiple medical co-morbidities.  Patient has been followed for HTN, HLD, T2_NIDDM and Vitamin D Deficiency. Patient has GERD controlled on her meds      HTN predates circa  2003. Patient's BP has been controlled at home and patient denies any cardiac symptoms as chest pain, palpitations, shortness of breath, dizziness or ankle swelling. Today's BP is at goal -  110/66.      Patient's hyperlipidemia is controlled with diet and Atorvastatin. Patient denies myalgias or other medication SE's. Last lipids were at goal:  Lab Results  Component Value Date   CHOL 168 05/13/2019   HDL 50 05/13/2019   LDLCALC 94 05/13/2019   TRIG 147 05/13/2019   CHOLHDL 3.4 05/13/2019       Patient has hx/o T2_NIDDM (2011)  w/CKD2 and patient denies reactive hypoglycemic symptoms, visual blurring, diabetic polys or paresthesias.  Patient "alleges" that alternate day fasting CBG's usually run about 110 mg% !   Patient is on Metformin & last A1c was not at goal:  Lab Results  Component Value Date   HGBA1C 6.8 (H) 05/13/2019   Wt Readings from Last 3 Encounters:  08/17/19 180 lb (81.6 kg)  05/13/19 181 lb 6.4 oz (82.3 kg)  02/10/19 181 lb 3.2 oz (82.2 kg)       Finally, patient has history of Vitamin D Deficiency and last Vitamin D was not at goal (70-100):  Lab Results  Component Value Date   VD25OH 57 02/10/2019    Current Outpatient Medications on File Prior to Visit  Medication Sig  . Accu-Chek Softclix Lancets lancets CHECK BLOOD GLUCOSE 1 TIME EVERY DAY. [id number]-R73.03  . acetaminophen (TYLENOL ARTHRITIS PAIN) 650 MG CR tablet Take 1,300 mg by mouth every 8 (eight) hours as needed. For pain  . albuterol (PROVENTIL) (2.5 MG/3ML) 0.083% nebulizer solution Take 3 mLs (2.5 mg total) by nebulization every 6  (six) hours as needed for wheezing or shortness of breath.  . Ascorbic Acid (VITAMIN C PO) Take 500 mg by mouth daily.   Marland Kitchen atorvastatin (LIPITOR) 40 MG tablet Take 1 tablet Daily for Cholesterol  . baclofen (LIORESAL) 10 MG tablet Take 1/2 - 1 tablet 2 - 3 x /day ONLY if needed for Muscle Spasm Attack  &  limit to 5 days /week to avoid Addiction & Dementia  . benazepril-hydrochlorthiazide (LOTENSIN HCT) 20-25 MG tablet Take 1 tablet Daily for BP  . Blood Glucose Monitoring Suppl (ACCU-CHEK AVIVA PLUS) w/Device KIT USE AS DIRECTED  TO CHECK BLOOD SUGAR ONE TIME DAILY.DX-R73.03  . brimonidine (ALPHAGAN) 0.2 % ophthalmic solution Place 1 drop into both eyes daily.  . calcium-vitamin D (OSCAL WITH D) 500-200 MG-UNIT tablet Take 1 tablet by mouth daily with breakfast.  . Cholecalciferol (VITAMIN D3) 5000 units TABS Take 10,000 Units by mouth daily.   . dorzolamide-timolol (COSOPT) 22.3-6.8 MG/ML ophthalmic solution Place 1 drop into both eyes 2 (two) times daily.  . famotidine (PEPCID) 40 MG tablet Take 1 tablet prior to dinner for Acid Reflux  . fluticasone (FLONASE) 50 MCG/ACT nasal spray SHAKE LIQUID AND USE 2 SPRAYS IN EACH NOSTRIL DAILY  . gabapentin (NEURONTIN) 600 MG tablet Take 1/2 to 1 tablet     2 to 3 x  /day if needed for Pain  . glucose blood (ACCU-CHEK AVIVA PLUS) test strip  Check blood glucose 1 time daily-DX-R73.03  . latanoprost (XALATAN) 0.005 % ophthalmic solution Place 1 drop into both eyes at bedtime.  Marland Kitchen LORazepam (ATIVAN) 1 MG tablet Take 1/2 - 1 tablet 2 - 3 x /day ONLY if needed for Anxiety Attack &  limit to 5 days /week to avoid Addiction & Dementia  . metFORMIN (GLUCOPHAGE-XR) 500 MG 24 hr tablet Take 2 tablets 2 x /day with meal for Diabetes  . montelukast (SINGULAIR) 10 MG tablet Take 1 tablet (10 mg total) by mouth at bedtime.  . Multiple Vitamin (MULTIVITAMIN WITH MINERALS) TABS tablet Take 1 tablet by mouth daily.   No current facility-administered medications on file  prior to visit.   Allergies  Allergen Reactions  . Penicillins Hives and Shortness Of Breath  . Aspirin Other (See Comments)    Stomach upset, but low dose is ok  . Hydromorphone Nausea Only    Other reaction(s): Sweating (intolerance)  . Sulfa Antibiotics Other (See Comments)    unknown  . Codeine Rash   Past Medical History:  Diagnosis Date  . Anxiety 07/23/2017  . Asthma   . Bronchitis    hx of;>72yr ago  . Chronic neck pain    arthritis  . COPD (chronic obstructive pulmonary disease) ([place]   . Degenerative disc disease    neck  . DeQuervain's disease (tenosynovitis)   . Diverticulitis   . Dizziness    related to neck issues  . Frozen shoulder September 2012  . GERD (gastroesophageal reflux disease)    takes Ranitidine nightly  . Glaucoma   . Hay fever   . Headache(784.0)    related neck issues  . High cholesterol    takes Lipitor daily  . History of shingles 2012  . Hypertension    takes Lotensin HCT daily  . Insomnia    d/t pain and takes Ativan nightly  . Osteoporosis   . Sarcoidosis >322yrago   no problems since  . Type II or unspecified type diabetes mellitus without mention of complication, not stated as uncontrolled    takes Metformin nightly   Health Maintenance  Topic Date Due  . COVID-19 Vaccine (1) Never done  . TETANUS/TDAP  03/19/2019  . INFLUENZA VACCINE  10/17/2019  . HEMOGLOBIN A1C  11/10/2019  . OPHTHALMOLOGY EXAM  03/21/2020  . FOOT EXAM  08/15/2020  . MAMMOGRAM  11/19/2020  . COLONOSCOPY  11/01/2027  . DEXA SCAN  Completed  . Hepatitis C Screening  Completed  . PNA vac Low Risk Adult  Completed   Immunization History  Administered Date(s) Administered  . Influenza, High Dose Seasonal PF 01/04/2015, 12/31/2016, 12/05/2017, 02/10/2019  . Influenza-Unspecified 03/01/2016  . Pneumococcal Conjugate-13 11/16/2013  . Pneumococcal Polysaccharide-23 04/12/2015  . Pneumococcal-Unspecified 03/18/2005  . Tdap 03/18/2009  . Zoster  11/13/2012    Last Colon - 08.16.2019 - Dr ChAnnye Englishecc 5 yr f/u due Aug 2024  [facility]  Past Surgical History:  Procedure Laterality Date  . ABDOMINAL HYSTERECTOMY  1975  . ANTERIOR CERVICAL DECOMP/DISCECTOMY FUSION  07/31/2011   Procedure: ANTERIOR CERVICAL DECOMPRESSION/DISCECTOMY FUSION 3 LEVELS;  Surgeon: RoHosie SpangleMD;  [facility];  Service: Neurosurgery;  Laterality: N/A;  Cervical four-five,Cervical five-six,Cervical six-seven anterior cervical decompression with fusion plating and bonegraft  . BIOPSY  10/31/2017   Procedure: BIOPSY;  Surgeon: WhIleana RoupMD;  [facility];  Service: General;;  . BREAST LUMPECTOMY Left   . BREAST SURGERY  20+yrs ago  left breast;benign  . CARPAL TUNNEL RELEASE  2012  . COLONOSCOPY    . COLONOSCOPY WITH PROPOFOL N/A 10/31/2017   Procedure: COLONOSCOPY SCREENING;  Surgeon: Ileana Roup, MD;  [facility];  Service: General;  Laterality: N/A;  . REFRACTIVE SURGERY  2011   bilateral  . TONSILLECTOMY  30+yrs ago  . WRIST SURGERY     ORIF left wrist and then removed d/t protruding screw in 2012   Family History  Problem Relation Age of Onset  . Cancer Mother 81       breast cancer  . Pneumonia Father   . Leukemia Father   . Glaucoma Father   . Mitral valve prolapse Sister   . Hypertension Sister   . Pulmonary embolism Sister   . Diabetes Sister   . HIV Son   . Anesthesia problems Neg Hx   . Hypotension Neg Hx   . Malignant hyperthermia Neg Hx   . Pseudochol deficiency Neg Hx    Social History   Tobacco Use  . Smoking status: Never Smoker  . Smokeless tobacco: Never Used  Substance Use Topics  . Alcohol use: No  . Drug use: No    ROS Constitutional: Denies fever, chills, weight loss/gain, headaches, insomnia,  night sweats, and change in appetite. Does c/o fatigue. Eyes: Denies redness, blurred vision, diplopia, discharge, itchy, watery eyes.  ENT: Denies  discharge, congestion, post nasal drip, epistaxis, sore throat, earache, hearing loss, dental pain, Tinnitus, Vertigo, Sinus pain, snoring.  Cardio: Denies chest pain, palpitations, irregular heartbeat, syncope, dyspnea, diaphoresis, orthopnea, PND, claudication, edema Respiratory: denies cough, dyspnea, DOE, pleurisy, hoarseness, laryngitis, wheezing.  Gastrointestinal: Denies dysphagia, heartburn, reflux, water brash, pain, cramps, nausea, vomiting, bloating, diarrhea, constipation, hematemesis, melena, hematochezia, jaundice, hemorrhoids Genitourinary: Denies dysuria, frequency, urgency, nocturia, hesitancy, discharge, hematuria, flank pain Breast: Breast lumps, nipple discharge, bleeding.  Musculoskeletal: Denies arthralgia, myalgia, stiffness, Jt. Swelling, pain, limp, and strain/sprain. Denies falls. Skin: Denies puritis, rash, hives, warts, acne, eczema, changing in skin lesion Neuro: No weakness, tremor, incoordination, spasms, paresthesia, pain Psychiatric: Denies confusion, memory loss, sensory loss. Denies Depression. Endocrine: Denies change in weight, skin, hair change, nocturia, and paresthesia, diabetic polys, visual blurring, hyper / hypo glycemic episodes.  Heme/Lymph: No excessive bleeding, bruising, enlarged lymph nodes.  Physical Exam  BP 110/66   Pulse 80   Temp (!) 97.4 F (36.3 C)   Resp 16   Ht 5' 4.75" (1.645 m)   Wt 180 lb (81.6 kg)   BMI 30.19 kg/m   General Appearance: Well nourished, well groomed and in no apparent distress.  Eyes: PERRLA, EOMs, conjunctiva no swelling or erythema, normal fundi and vessels. Sinuses: No frontal/maxillary tenderness ENT/Mouth: EACs patent / TMs  nl. Nares clear without erythema, swelling, mucoid exudates. Oral hygiene is good. No erythema, swelling, or exudate. Tongue normal, non-obstructing. Tonsils not swollen or erythematous. Hearing normal.  Neck: Supple, thyroid not palpable. No bruits, nodes or JVD. Respiratory:  Respiratory effort normal.  BS equal and clear bilateral without rales, rhonci, wheezing or stridor. Cardio: Heart sounds are normal with regular rate and rhythm and no murmurs, rubs or gallops. Peripheral pulses are normal and equal bilaterally without edema. No aortic or femoral bruits. Chest: symmetric with normal excursions and percussion. Breasts: Symmetric, without lumps, nipple discharge, retractions, or fibrocystic changes.  Abdomen: Flat, soft with bowel sounds active. Nontender, no guarding, rebound, hernias, masses, or organomegaly.  Lymphatics: Non tender without lymphadenopathy.  Musculoskeletal: Full ROM all peripheral extremities, joint stability,  5/5 strength, and normal gait. Skin: Warm and dry without rashes, lesions, cyanosis, clubbing or  ecchymosis.  Neuro: Cranial nerves intact, reflexes equal bilaterally. Normal muscle tone, no cerebellar symptoms. Sensation intact to touch, vibratory and Monofilament to the toes bilaterally. Pysch: Alert and oriented X 3, normal affect, Insight and Judgment appropriate.   Assessment and Plan  1. Annual Preventative Screening Examination  2. Essential hypertension  - EKG 12-Lead - Urinalysis, Routine w reflex microscopic - Microalbumin / creatinine urine ratio - CBC with Differential/Platelet - COMPLETE METABOLIC PANEL WITH GFR - Magnesium - TSH  3. Hyperlipidemia associated with type 2 diabetes mellitus (HCC)  - TSH  4. Type 2 diabetes mellitus with stage 2 chronic kidney disease,  without long-term current use of insulin (HCC)  - EKG 12-Lead - Urinalysis, Routine w reflex microscopic - Microalbumin / creatinine urine ratio - HM DIABETES FOOT EXAM - LOW EXTREMITY NEUR EXAM DOCUM - Hemoglobin A1c - Insulin, random  5. Vitamin D deficiency  - VITAMIN D 25 Hydroxy  6. Gastroesophageal reflux disease  - CBC with Differential/Platelet  7. Chronic obstructive pulmonary disease ([place])   8. Screening for colorectal  cancer  - POC Hemoccult Bld/Stl (3-Cd Home Screen); Future  9. Screening for ischemic heart disease  - EKG 12-Lead  10. FHx: heart disease   11. Medication management  - Urinalysis, Routine w reflex microscopic - Microalbumin / creatinine urine ratio - CBC with Differential/Platelet - COMPLETE METABOLIC PANEL WITH GFR - Magnesium - Lipid panel - TSH - Hemoglobin A1c - Insulin, random - VITAMIN D 25 Hydroxy          Patient was counseled in prudent diet to achieve/maintain BMI less than 25 for weight control, BP monitoring, regular exercise and medications. Discussed med's effects and SE's. Screening labs and tests as requested with regular follow-up as recommended. Over 40 minutes of exam, counseling, chart review and high complex critical decision making was performed.   Kirtland Bouchard, MD

## 2019-08-17 ENCOUNTER — Ambulatory Visit (INDEPENDENT_AMBULATORY_CARE_PROVIDER_SITE_OTHER): Payer: Medicare Other | Admitting: Internal Medicine

## 2019-08-17 ENCOUNTER — Other Ambulatory Visit: Payer: Self-pay

## 2019-08-17 VITALS — BP 110/66 | HR 80 | Temp 97.4°F | Resp 16 | Ht 64.75 in | Wt 180.0 lb

## 2019-08-17 DIAGNOSIS — K219 Gastro-esophageal reflux disease without esophagitis: Secondary | ICD-10-CM

## 2019-08-17 DIAGNOSIS — Z8249 Family history of ischemic heart disease and other diseases of the circulatory system: Secondary | ICD-10-CM | POA: Diagnosis not present

## 2019-08-17 DIAGNOSIS — Z1211 Encounter for screening for malignant neoplasm of colon: Secondary | ICD-10-CM

## 2019-08-17 DIAGNOSIS — E1169 Type 2 diabetes mellitus with other specified complication: Secondary | ICD-10-CM

## 2019-08-17 DIAGNOSIS — Z136 Encounter for screening for cardiovascular disorders: Secondary | ICD-10-CM

## 2019-08-17 DIAGNOSIS — I10 Essential (primary) hypertension: Secondary | ICD-10-CM

## 2019-08-17 DIAGNOSIS — Z0000 Encounter for general adult medical examination without abnormal findings: Secondary | ICD-10-CM

## 2019-08-17 DIAGNOSIS — J449 Chronic obstructive pulmonary disease, unspecified: Secondary | ICD-10-CM

## 2019-08-17 DIAGNOSIS — N182 Chronic kidney disease, stage 2 (mild): Secondary | ICD-10-CM

## 2019-08-17 DIAGNOSIS — E559 Vitamin D deficiency, unspecified: Secondary | ICD-10-CM | POA: Diagnosis not present

## 2019-08-17 DIAGNOSIS — Z0001 Encounter for general adult medical examination with abnormal findings: Secondary | ICD-10-CM

## 2019-08-17 DIAGNOSIS — E1122 Type 2 diabetes mellitus with diabetic chronic kidney disease: Secondary | ICD-10-CM | POA: Diagnosis not present

## 2019-08-17 DIAGNOSIS — E785 Hyperlipidemia, unspecified: Secondary | ICD-10-CM

## 2019-08-17 DIAGNOSIS — Z79899 Other long term (current) drug therapy: Secondary | ICD-10-CM

## 2019-08-18 LAB — COMPLETE METABOLIC PANEL WITH GFR
AG Ratio: 2 (calc) (ref 1.0–2.5)
ALT: 19 U/L (ref 6–29)
AST: 17 U/L (ref 10–35)
Albumin: 4.5 g/dL (ref 3.6–5.1)
Alkaline phosphatase (APISO): 76 U/L (ref 37–153)
BUN: 18 mg/dL (ref 7–25)
CO2: 29 mmol/L (ref 20–32)
Calcium: 9.8 mg/dL (ref 8.6–10.4)
Chloride: 105 mmol/L (ref 98–110)
Creat: 0.83 mg/dL (ref 0.60–0.93)
GFR, Est African American: 82 mL/min/{1.73_m2} (ref >=60)
GFR, Est Non African American: 71 mL/min/{1.73_m2} (ref >=60)
Globulin: 2.3 g/dL (calc) (ref 1.9–3.7)
Glucose, Bld: 114 mg/dL — ABNORMAL HIGH (ref 65–99)
Potassium: 4.4 mmol/L (ref 3.5–5.3)
Sodium: 140 mmol/L (ref 135–146)
Total Bilirubin: 0.5 mg/dL (ref 0.2–1.2)
Total Protein: 6.8 g/dL (ref 6.1–8.1)

## 2019-08-18 LAB — CBC WITH DIFFERENTIAL/PLATELET
Absolute Monocytes: 318 cells/uL (ref 200–950)
Basophils Absolute: 30 cells/uL (ref 0–200)
Basophils Relative: 0.8 %
Eosinophils Absolute: 59 cells/uL (ref 15–500)
Eosinophils Relative: 1.6 %
HCT: 38.9 % (ref 35.0–45.0)
Hemoglobin: 12.8 g/dL (ref 11.7–15.5)
Lymphs Abs: 1458 cells/uL (ref 850–3900)
MCH: 30 pg (ref 27.0–33.0)
MCHC: 32.9 g/dL (ref 32.0–36.0)
MCV: 91.1 fL (ref 80.0–100.0)
MPV: 10.1 fL (ref 7.5–12.5)
Monocytes Relative: 8.6 %
Neutro Abs: 1835 cells/uL (ref 1500–7800)
Neutrophils Relative %: 49.6 %
Platelets: 247 10*3/uL (ref 140–400)
RBC: 4.27 10*6/uL (ref 3.80–5.10)
RDW: 13 % (ref 11.0–15.0)
Total Lymphocyte: 39.4 %
WBC: 3.7 10*3/uL — ABNORMAL LOW (ref 3.8–10.8)

## 2019-08-18 LAB — LIPID PANEL
Cholesterol: 158 mg/dL (ref <200)
HDL: 54 mg/dL (ref >=50)
LDL Cholesterol (Calc): 86 mg/dL (calc)
Non-HDL Cholesterol (Calc): 104 mg/dL (calc) (ref <130)
Total CHOL/HDL Ratio: 2.9 (calc) (ref <5.0)
Triglycerides: 87 mg/dL (ref <150)

## 2019-08-18 LAB — VITAMIN D 25 HYDROXY (VIT D DEFICIENCY, FRACTURES): Vit D, 25-Hydroxy: 87 ng/mL (ref 30–100)

## 2019-08-18 LAB — URINALYSIS, ROUTINE W REFLEX MICROSCOPIC
Bilirubin Urine: NEGATIVE
Glucose, UA: NEGATIVE
Hgb urine dipstick: NEGATIVE
Leukocytes,Ua: NEGATIVE
Nitrite: NEGATIVE
Protein, ur: NEGATIVE
Specific Gravity, Urine: 1.027 (ref 1.001–1.03)
pH: 5.5 (ref 5.0–8.0)

## 2019-08-18 LAB — HEMOGLOBIN A1C
Hgb A1c MFr Bld: 6.5 % of total Hgb — ABNORMAL HIGH (ref <5.7)
Mean Plasma Glucose: 140 (calc)
eAG (mmol/L): 7.7 (calc)

## 2019-08-18 LAB — MICROALBUMIN / CREATININE URINE RATIO
Creatinine, Urine: 200 mg/dL (ref 20–275)
Microalb Creat Ratio: 4 mcg/mg creat (ref <30)
Microalb, Ur: 0.7 mg/dL

## 2019-08-18 LAB — INSULIN, RANDOM: Insulin: 13.2 u[IU]/mL

## 2019-08-18 LAB — TSH: TSH: 1.83 mIU/L (ref 0.40–4.50)

## 2019-08-18 LAB — MAGNESIUM: Magnesium: 2.1 mg/dL (ref 1.5–2.5)

## 2019-08-30 ENCOUNTER — Ambulatory Visit: Payer: Medicare Other | Attending: Internal Medicine

## 2019-08-30 DIAGNOSIS — Z20822 Contact with and (suspected) exposure to covid-19: Secondary | ICD-10-CM

## 2019-08-31 LAB — NOVEL CORONAVIRUS, NAA: SARS-CoV-2, NAA: NOT DETECTED

## 2019-08-31 LAB — SARS-COV-2, NAA 2 DAY TAT

## 2019-09-09 DIAGNOSIS — E119 Type 2 diabetes mellitus without complications: Secondary | ICD-10-CM | POA: Diagnosis not present

## 2019-09-09 LAB — HM DIABETES EYE EXAM

## 2019-09-14 ENCOUNTER — Encounter: Payer: Self-pay | Admitting: Internal Medicine

## 2019-09-22 DIAGNOSIS — E1136 Type 2 diabetes mellitus with diabetic cataract: Secondary | ICD-10-CM | POA: Diagnosis not present

## 2019-09-22 DIAGNOSIS — H401133 Primary open-angle glaucoma, bilateral, severe stage: Secondary | ICD-10-CM | POA: Diagnosis not present

## 2019-09-22 DIAGNOSIS — H2513 Age-related nuclear cataract, bilateral: Secondary | ICD-10-CM | POA: Diagnosis not present

## 2019-09-23 ENCOUNTER — Other Ambulatory Visit: Payer: Self-pay | Admitting: Internal Medicine

## 2019-09-23 DIAGNOSIS — M533 Sacrococcygeal disorders, not elsewhere classified: Secondary | ICD-10-CM

## 2019-09-23 DIAGNOSIS — M47816 Spondylosis without myelopathy or radiculopathy, lumbar region: Secondary | ICD-10-CM

## 2019-10-11 ENCOUNTER — Ambulatory Visit: Payer: Medicare Other | Attending: Internal Medicine

## 2019-10-11 DIAGNOSIS — Z20822 Contact with and (suspected) exposure to covid-19: Secondary | ICD-10-CM | POA: Diagnosis not present

## 2019-10-12 LAB — NOVEL CORONAVIRUS, NAA: SARS-CoV-2, NAA: NOT DETECTED

## 2019-10-12 LAB — SARS-COV-2, NAA 2 DAY TAT

## 2019-10-13 ENCOUNTER — Ambulatory Visit: Payer: Self-pay

## 2019-10-14 ENCOUNTER — Ambulatory Visit: Payer: Medicare Other | Attending: Internal Medicine

## 2019-10-14 DIAGNOSIS — Z23 Encounter for immunization: Secondary | ICD-10-CM

## 2019-10-14 NOTE — Progress Notes (Signed)
° °  Covid-19 Vaccination Clinic  Name:  Nika Yazzie    MRN: [id number] DOB: March 14, 1946  10/14/2019  Ms. Galen was observed post Covid-19 immunization for 15 minutes without incident. She was provided with Vaccine Information Sheet and instruction to access the V-Safe system.   Ms. Stlouis was instructed to call 911 with any severe reactions post vaccine:  Difficulty breathing   Swelling of face and throat   A fast heartbeat   A bad rash all over body   Dizziness and weakness   Immunizations Administered    Name Date Dose VIS Date Route   Pfizer COVID-19 Vaccine 10/14/2019 10:14 AM 0.3 mL 05/12/2018 Intramuscular   Manufacturer: [place]   Lot: [id number]   [place]: [id number]

## 2019-10-20 ENCOUNTER — Other Ambulatory Visit: Payer: Self-pay | Admitting: Adult Health

## 2019-10-21 ENCOUNTER — Other Ambulatory Visit: Payer: Self-pay | Admitting: *Deleted

## 2019-10-21 DIAGNOSIS — Z1212 Encounter for screening for malignant neoplasm of rectum: Secondary | ICD-10-CM | POA: Diagnosis not present

## 2019-10-21 DIAGNOSIS — Z1211 Encounter for screening for malignant neoplasm of colon: Secondary | ICD-10-CM | POA: Diagnosis not present

## 2019-10-21 LAB — POC HEMOCCULT BLD/STL (HOME/3-CARD/SCREEN)
Card #2 Fecal Occult Blod, POC: NEGATIVE
Card #3 Fecal Occult Blood, POC: NEGATIVE
Fecal Occult Blood, POC: NEGATIVE

## 2019-11-05 ENCOUNTER — Other Ambulatory Visit: Payer: Self-pay | Admitting: Sleep Medicine

## 2019-11-05 ENCOUNTER — Other Ambulatory Visit: Payer: Medicare Other

## 2019-11-05 DIAGNOSIS — Z20822 Contact with and (suspected) exposure to covid-19: Secondary | ICD-10-CM | POA: Diagnosis not present

## 2019-11-06 LAB — SPECIMEN STATUS REPORT

## 2019-11-06 LAB — SARS-COV-2, NAA 2 DAY TAT

## 2019-11-06 LAB — NOVEL CORONAVIRUS, NAA: SARS-CoV-2, NAA: NOT DETECTED

## 2019-11-09 ENCOUNTER — Ambulatory Visit: Payer: Medicare Other

## 2019-11-09 ENCOUNTER — Other Ambulatory Visit: Payer: Medicare Other

## 2019-11-17 ENCOUNTER — Ambulatory Visit: Payer: Medicare Other | Admitting: Adult Health

## 2019-11-17 NOTE — Progress Notes (Deleted)
FOLLOW UP  Assessment and Plan:   Essential hypertension - continue medications, DASH diet, exercise and monitor at home. Call if greater than 130/80.  -     CBC with Differential/Platelet -     COMPLETE METABOLIC PANEL WITH GFR -     TSH  Type 2 diabetes mellitus with diabetic nephropathy, without long-term current use of insulin ([place]) Discussed general issues about diabetes pathophysiology and management., Educational material distributed., Suggested low cholesterol diet., Encouraged aerobic exercise., Discussed foot care., Reminded to get yearly retinal exam. -     Hemoglobin A1c  CKD stage 2 due to type 2 diabetes mellitus ([place]) -     CMP/GFR Discussed disease progression and risks Discussed diet/exercise, weight management and risk modification  Hyperlipidemia associated with T2DM ([place]) check lipids LDL goal <70, consider switch to rosuvastatin if remains above goal  decrease fatty foods increase activity.  -     Lipid panel  Medication management Continue supplement  Overweight (BMI 25.0-29.9) *** - long discussion about weight loss, diet, and exercise -recommended diet heavy in fruits and veggies and low in animal meats, cheeses, and dairy products  Vitamin D deficiency At goal at recent check; continue to recommend supplementation for goal of 60-100 Defer vitamin D level  Insomnia - good sleep hygiene discussed, increase day time activity, try benadryl if this does not help we will call in sleep medication.   Asthma/COPD *** Cost is currently a burden with rescue inhaler; she will call insurance RE cost of preferred inhalers and message back Consider switch to nebulized if no reasonable options Given 4 week sample of breo hold and start if needed Will trial addition of singulair after discussion; appears allergies significant trigger Discussed goal is no coughing/wheezing in evening She will message back in 1 month with progress  GERD *** ? Breakthrough sx;  sent in famotidine to initiate in the evening Discussed diet, avoiding triggers and other lifestyle changes   Continue diet and meds as discussed. Further disposition pending results of labs. Discussed med's effects and SE's.   Over 30 minutes of exam, counseling, chart review, and critical decision making was performed Future Appointments  Date Time Provider [place]  11/18/2019  10:30 AM Liane Comber, NP [facility] None  11/23/2019 10:00 AM [facility] MM 2 [facility]  11/23/2019 10:30 AM [facility] DX DEXA 1 [facility]  02/18/2020 11:00 AM Unk Pinto, MD [facility] None  05/18/2020 10:30 AM Garnet Sierras, NP [facility] None  08/17/2020 11:00 AM Unk Pinto, MD [facility] None     HPI 74 y.o. female  presents for 3 month follow up on hypertension, cholesterol, diabetes and vitamin D deficiency.   She is following with Dr. Doran Durand for numerous ortho concerns; she reports overall has improved with steroid injections and PT.  She uses cervical pillow at night; has gabapentin, tylenol, topical Voltaren and baclofen and reports managing well.   She has COPD/asthma which is mild, typically triggered by mowing her grass; she is currently prescribed only PRN albuterol; she reports in the average month she uses 1-2 times, typically after mowing lawn, etc, however she reports frequently having coughing spells at night and intermittent wheezing. ***  She does have GERD; not currently taking anything since stopping ranitidine after recall;   She is on 1/2 tablet of lorazepam at night, tried melatonin but it did not help.   Her blood pressure has been controlled at home, today their BP is    She does workout. She  denies chest pain, shortness of breath, dizziness.  BMI is There is no height or weight on file to calculate BMI., she is working on diet and exercise. Wt Readings from Last 3 Encounters:  08/17/19 180 lb (81.6 kg)  05/13/19 181 lb 6.4 oz (82.3 kg)   02/10/19 181 lb 3.2 oz (82.2 kg)    She is on cholesterol medication (atorvastatin 40 mg daily since the last visit, increased from 20 mg daily ***) and denies myalgias. Her cholesterol is not at goal. The cholesterol last visit was:   Lab Results  Component Value Date   CHOL 158 08/17/2019   HDL 54 08/17/2019   LDLCALC 86 08/17/2019   TRIG 87 08/17/2019   CHOLHDL 2.9 08/17/2019   She has been working on diet and exercise for Diabetes with diabetic chronic kidney disease, she is on bASA, she is on ACE/ARB, and denies paresthesia of the feet, polydipsia, polyuria and visual disturbances. She is on metformin (takes 500 mg once daily), she does check fasting glucose - ranging 108-118. Last A1C was:  Lab Results  Component Value Date   HGBA1C 6.5 (H) 08/17/2019   Lab Results  Component Value Date   GFRAA 82 08/17/2019   Patient is on Vitamin D supplement.   Lab Results  Component Value Date   VD25OH 87 08/17/2019       Current Medications:  Current Outpatient Medications on File Prior to Visit  Medication Sig   Accu-Chek Softclix Lancets lancets CHECK BLOOD GLUCOSE 1 TIME EVERY DAY. Dx-R73.03   acetaminophen (TYLENOL ARTHRITIS PAIN) 650 MG CR tablet Take 1,300 mg by mouth every 8 (eight) hours as needed. For pain   albuterol (PROVENTIL) (2.5 MG/3ML) 0.083% nebulizer solution Take 3 mLs (2.5 mg total) by nebulization every 6 (six) hours as needed for wheezing or shortness of breath.   Ascorbic Acid (VITAMIN C PO) Take 500 mg by mouth daily.    atorvastatin (LIPITOR) 40 MG tablet TAKE 1 TABLET BY MOUTH  DAILY FOR CHOLESTEROL   baclofen (LIORESAL) 10 MG tablet Take 1/2 - 1 tablet 2 - 3 x /day ONLY if needed for Muscle Spasm Attack  &  limit to 5 days /week to avoid Addiction & Dementia   benazepril-hydrochlorthiazide (LOTENSIN HCT) 20-25 MG tablet Take 1 tablet Daily for BP   Blood Glucose Monitoring Suppl (ACCU-CHEK AVIVA PLUS) w/Device KIT USE AS DIRECTED  TO CHECK BLOOD  SUGAR ONE TIME DAILY.DX-R73.03   brimonidine (ALPHAGAN) 0.2 % ophthalmic solution Place 1 drop into both eyes daily.   calcium-vitamin D (OSCAL WITH D) 500-200 MG-UNIT tablet Take 1 tablet by mouth daily with breakfast.   Cholecalciferol (VITAMIN D3) 5000 units TABS Take 10,000 Units by mouth daily.    dorzolamide-timolol (COSOPT) 22.3-6.8 MG/ML ophthalmic solution Place 1 drop into both eyes 2 (two) times daily.   famotidine (PEPCID) 40 MG tablet Take 1 tablet Daily to Prevent Heartburn & Indigestion   fluticasone (FLONASE) 50 MCG/ACT nasal spray SHAKE LIQUID AND USE 2 SPRAYS IN EACH NOSTRIL DAILY   gabapentin (NEURONTIN) 600 MG tablet TAKE 1/2 TO 1 TABLET BY  MOUTH 2 TO 3 TIMES DAILY IF NEEDED FOR PAIN   glucose blood (ACCU-CHEK AVIVA PLUS) test strip Check blood glucose 1 time daily-DX-R73.03   latanoprost (XALATAN) 0.005 % ophthalmic solution Place 1 drop into both eyes at bedtime.   LORazepam (ATIVAN) 1 MG tablet Take 1/2 - 1 tablet 2 - 3 x /day ONLY if needed for Anxiety Attack &  limit  to 5 days /week to avoid Addiction & Dementia   metFORMIN (GLUCOPHAGE-XR) 500 MG 24 hr tablet Take 2 tablets 2 x /day with meal for Diabetes   montelukast (SINGULAIR) 10 MG tablet Take 1 tablet (10 mg total) by mouth at bedtime.   Multiple Vitamin (MULTIVITAMIN WITH MINERALS) TABS tablet Take 1 tablet by mouth daily.   No current facility-administered medications on file prior to visit.    Medical History:  Past Medical History:  Diagnosis Date   Anxiety 07/23/2017   Asthma    Bronchitis    hx of;>32yr ago   Chronic neck pain    arthritis   COPD (chronic obstructive pulmonary disease) ([facility])    Degenerative disc disease    neck   DeQuervain's disease (tenosynovitis)    Diverticulitis    Dizziness    related to neck issues   Frozen shoulder September 2012   GERD (gastroesophageal reflux disease)    takes Ranitidine nightly   Glaucoma    Hay fever    Headache(784.0)     related neck issues   High cholesterol    takes Lipitor daily   History of shingles 2012   Hypertension    takes Lotensin HCT daily   Insomnia    d/t pain and takes Ativan nightly   Osteoporosis    Sarcoidosis >[place]   no problems since   Type II or unspecified type diabetes mellitus without mention of complication, not stated as uncontrolled    takes Metformin nightly   Allergies:  Allergies  Allergen Reactions   Penicillins Hives and Shortness Of Breath   Aspirin Other (See Comments)    Stomach upset, but low dose is ok   Hydromorphone Nausea Only    Other reaction(s): Sweating (intolerance)   Sulfa Antibiotics Other (See Comments)    unknown   Codeine Rash     Review of Systems:  Review of Systems  Constitutional: Negative for malaise/fatigue and weight loss.  HENT: Negative for hearing loss and tinnitus.   Eyes: Negative for blurred vision and double vision.  Respiratory: Positive for wheezing (intermittent). Negative for cough and shortness of breath.   Cardiovascular: Negative for chest pain, palpitations, orthopnea, claudication and leg swelling.  Gastrointestinal: Negative for abdominal pain, blood in stool, constipation, diarrhea, heartburn, melena, nausea and vomiting.  Genitourinary: Negative.   Musculoskeletal: Negative for joint pain and myalgias.  Skin: Negative for rash.  Neurological: Negative for dizziness, tingling, sensory change, weakness and headaches.  Endo/Heme/Allergies: Negative for polydipsia.  Psychiatric/Behavioral: Negative.   All other systems reviewed and are negative.   Family history- Review and unchanged Social history- Review and unchanged Physical Exam: There were no vitals taken for this visit. Wt Readings from Last 3 Encounters:  08/17/19 180 lb (81.6 kg)  05/13/19 181 lb 6.4 oz (82.3 kg)  02/10/19 181 lb 3.2 oz (82.2 kg)   General Appearance: Well nourished, in no apparent distress. Eyes: PERRLA, EOMs,  conjunctiva no swelling or erythema Sinuses: No Frontal/maxillary tenderness ENT/Mouth: Ext aud canals clear, TMs without erythema, bulging. No erythema, swelling, or exudate on post pharynx.  Tonsils not swollen or erythematous. Hearing normal.  Neck: Supple, thyroid normal.  Respiratory: Respiratory effort normal, BS equal bilaterally without rales, rhonchi, or stridor. She does have mild wheezing bil.  Cardio: RRR with no MRGs. Brisk peripheral pulses without edema.  Abdomen: Soft, + BS.  Non tender, no guarding, rebound, hernias, masses. Lymphatics: Non tender without lymphadenopathy.  Musculoskeletal: Full ROM, 5/5 strength, Normal  gait Skin: Warm, dry without rashes, lesions, ecchymosis.  Neuro: Cranial nerves intact. No cerebellar symptoms.  Psych: Awake and oriented X 3, normal affect, Insight and Judgment appropriate.    Izora Ribas, NP 9:17 AM [facility]

## 2019-11-18 ENCOUNTER — Ambulatory Visit: Payer: Medicare Other | Admitting: Adult Health

## 2019-11-23 ENCOUNTER — Ambulatory Visit: Payer: Medicare Other

## 2019-11-23 ENCOUNTER — Other Ambulatory Visit: Payer: Medicare Other

## 2019-11-30 NOTE — Progress Notes (Signed)
FOLLOW UP  Assessment and Plan:   Essential hypertension - continue medications, DASH diet, exercise and monitor at home. Call if greater than 130/80.  -     CBC with Differential/Platelet -     COMPLETE METABOLIC PANEL WITH GFR -     TSH  Type 2 diabetes mellitus with diabetic nephropathy, without long-term current use of insulin ([place]) Discussed general issues about diabetes pathophysiology and management., Educational material distributed., Suggested low cholesterol diet., Encouraged aerobic exercise., Discussed foot care., Reminded to get yearly retinal exam. -     Hemoglobin A1c  CKD stage 2 due to type 2 diabetes mellitus ([place]) -     CMP/GFR Discussed disease progression and risks Discussed diet/exercise, weight management and risk modification  Hyperlipidemia associated with T2DM ([place]) check lipids LDL goal <70, consider switch to rosuvastatin if remains above goal  decrease fatty foods increase activity.  -     Lipid panel  Medication management Continue supplement  Overweight - long discussion about weight loss, diet, and exercise -recommended diet heavy in fruits and veggies and low in animal meats, cheeses, and dairy products - weight goal 165 lb  Vitamin D deficiency At goal at recent check; continue to recommend supplementation for goal of 60-100 Defer vitamin D level  Insomnia - good sleep hygiene discussed, increase day time activity, try benadryl if this does not help we will call in sleep medication.   Asthma/COPD  Well manged with singulaire and PRN neb albuterol (due to cost)  Continue to avoid triggers  Continue diet and meds as discussed. Further disposition pending results of labs. Discussed med's effects and SE's.   Over 30 minutes of exam, counseling, chart review, and critical decision making was performed Future Appointments  Date Time Provider [place]  02/18/2020 11:00 AM Unk Pinto, MD [facility] None  03/03/2020  8:30 AM  GI-[facility]G DX DEXA 1 [facility]-BCGD[facility]  03/03/2020  9:10 AM [facility]G MM 3 GI-BCGMM [facility] CE  05/18/2020 10:30 AM Garnet Sierras, NP [facility] None  08/17/2020 11:00 AM Unk Pinto, MD [facility] None     HPI 74 y.o. female  presents for 3 month follow up on hypertension, cholesterol, diabetes and vitamin D deficiency. GERD controlled on famotidine.   She has COPD/asthma which is mild, typically triggered by mowing her grass; she is currently prescribed only PRN neb albuterol; she reports in the average month she uses 1-2 times.   She is on 1/2 tablet of lorazepam at night as needed (currently 1-2 days a week, down from needing daily), on gabapentin for back pain, doing much better recently and trying to walk more.   Her blood pressure has been controlled at home, today their BP is BP: 124/76  She does workout. She denies chest pain, shortness of breath, dizziness.  BMI is Body mass index is 29.68 kg/m., she is working on diet and exercise. Wt Readings from Last 3 Encounters:  12/01/19 177 lb (80.3 kg)  08/17/19 180 lb (81.6 kg)  05/13/19 181 lb 6.4 oz (82.3 kg)    She is on cholesterol medication (atorvastatin 40 mg daily since the last visit) and denies myalgias. Her cholesterol is not at goal. The cholesterol last visit was:   Lab Results  Component Value Date   CHOL 158 08/17/2019   HDL 54 08/17/2019   LDLCALC 86 08/17/2019   TRIG 87 08/17/2019   CHOLHDL 2.9 08/17/2019   She has been working on diet and exercise for Diabetes with diabetic chronic kidney  disease, she is on bASA, she is on ACE/ARB, and denies paresthesia of the feet, polydipsia, polyuria and visual disturbances. She is on metformin (takes 500 mg once daily), she does check fasting glucose - ranging 108-118. Last A1C was:  Lab Results  Component Value Date   HGBA1C 6.5 (H) 08/17/2019   She has CKD II associated with T2DM monitored at this office; on ARB Lab Results  Component Value Date   GFRAA 82  08/17/2019   Patient is on Vitamin D supplement.   Lab Results  Component Value Date   VD25OH 87 08/17/2019       Current Medications:  Current Outpatient Medications on File Prior to Visit  Medication Sig  . Accu-Chek Softclix Lancets lancets CHECK BLOOD GLUCOSE 1 TIME EVERY DAY. Dx-R73.03  . acetaminophen (TYLENOL ARTHRITIS PAIN) 650 MG CR tablet Take 1,300 mg by mouth every 8 (eight) hours as needed. For pain  . albuterol (PROVENTIL) (2.5 MG/3ML) 0.083% nebulizer solution Take 3 mLs (2.5 mg total) by nebulization every 6 (six) hours as needed for wheezing or shortness of breath.  . Ascorbic Acid (VITAMIN C PO) Take 500 mg by mouth daily.   Marland Kitchen atorvastatin (LIPITOR) 40 MG tablet TAKE 1 TABLET BY MOUTH  DAILY FOR CHOLESTEROL  . baclofen (LIORESAL) 10 MG tablet Take 1/2 - 1 tablet 2 - 3 x /day ONLY if needed for Muscle Spasm Attack  &  limit to 5 days /week to avoid Addiction & Dementia  . benazepril-hydrochlorthiazide (LOTENSIN HCT) 20-25 MG tablet Take 1 tablet Daily for BP  . Blood Glucose Monitoring Suppl (ACCU-CHEK AVIVA PLUS) w/Device KIT USE AS DIRECTED  TO CHECK BLOOD SUGAR ONE TIME DAILY.DX-R73.03  . brimonidine (ALPHAGAN) 0.2 % ophthalmic solution Place 1 drop into both eyes daily.  . calcium-vitamin D (OSCAL WITH D) 500-200 MG-UNIT tablet Take 1 tablet by mouth daily with breakfast.  . Cholecalciferol (VITAMIN D3) 5000 units TABS Take 10,000 Units by mouth daily.   . dorzolamide-timolol (COSOPT) 22.3-6.8 MG/ML ophthalmic solution Place 1 drop into both eyes 2 (two) times daily.  . famotidine (PEPCID) 40 MG tablet Take 1 tablet Daily to Prevent Heartburn & Indigestion  . fluticasone (FLONASE) 50 MCG/ACT nasal spray SHAKE LIQUID AND USE 2 SPRAYS IN EACH NOSTRIL DAILY  . gabapentin (NEURONTIN) 600 MG tablet TAKE 1/2 TO 1 TABLET BY  MOUTH 2 TO 3 TIMES DAILY IF NEEDED FOR PAIN  . glucose blood (ACCU-CHEK AVIVA PLUS) test strip Check blood glucose 1 time daily-DX-R73.03  . latanoprost  (XALATAN) 0.005 % ophthalmic solution Place 1 drop into both eyes at bedtime.  Marland Kitchen LORazepam (ATIVAN) 1 MG tablet Take 1/2 - 1 tablet 2 - 3 x /day ONLY if needed for Anxiety Attack &  limit to 5 days /week to avoid Addiction & Dementia  . metFORMIN (GLUCOPHAGE-XR) 500 MG 24 hr tablet Take 2 tablets 2 x /day with meal for Diabetes  . Multiple Vitamin (MULTIVITAMIN WITH MINERALS) TABS tablet Take 1 tablet by mouth daily.  . montelukast (SINGULAIR) 10 MG tablet Take 1 tablet (10 mg total) by mouth at bedtime.   No current facility-administered medications on file prior to visit.    Medical History:  Past Medical History:  Diagnosis Date  . Anxiety 07/23/2017  . Asthma   . Bronchitis    hx of;>74yr ago  . Chronic neck pain    arthritis  . [place]   . Degenerative disc disease    neck  .  DeQuervain's disease (tenosynovitis)   . Diverticulitis   . Dizziness    related to neck issues  . Frozen shoulder September 2012  . GERD (gastroesophageal reflux disease)    takes Ranitidine nightly  . Glaucoma   . Hay fever   . Headache(784.0)    related neck issues  . High cholesterol    takes Lipitor daily  . History of shingles 2012  . Hypertension    takes Lotensin HCT daily  . Insomnia    d/t pain and takes Ativan nightly  . Osteoporosis   . Sarcoidosis >20yr ago   no problems since  . Type II or unspecified type diabetes mellitus without mention of complication, not stated as uncontrolled    takes Metformin nightly   Allergies:  Allergies  Allergen Reactions  . Penicillins Hives and Shortness Of Breath  . Aspirin Other (See Comments)    Stomach upset, but low dose is ok  . Hydromorphone Nausea Only    Other reaction(s): Sweating (intolerance)  . Sulfa Antibiotics Other (See Comments)    unknown  . Codeine Rash     Review of Systems:  Review of Systems  Constitutional: Negative for malaise/fatigue and weight loss.  HENT: Negative  for hearing loss and tinnitus.   Eyes: Negative for blurred vision and double vision.  Respiratory: Negative for cough, shortness of breath and wheezing.   Cardiovascular: Negative for chest pain, palpitations, orthopnea, claudication and leg swelling.  Gastrointestinal: Negative for abdominal pain, blood in stool, constipation, diarrhea, heartburn, melena, nausea and vomiting.  Genitourinary: Negative.   Musculoskeletal: Positive for back pain (intermittent, improving). Negative for joint pain and myalgias.  Skin: Negative for rash.  Neurological: Negative for dizziness, tingling, sensory change, weakness and headaches.  Endo/Heme/Allergies: Negative for polydipsia.  Psychiatric/Behavioral: Negative.   All other systems reviewed and are negative.   Family history- Review and unchanged Social history- Review and unchanged Physical Exam: BP 124/76   Pulse 100   Temp 97.6 F (36.4 C)   Wt 177 lb (80.3 kg)   SpO2 98%   BMI 29.68 kg/m  Wt Readings from Last 3 Encounters:  12/01/19 177 lb (80.3 kg)  08/17/19 180 lb (81.6 kg)  05/13/19 181 lb 6.4 oz (82.3 kg)   General Appearance: Well nourished, in no apparent distress. Eyes: PERRLA, EOMs, conjunctiva no swelling or erythema Sinuses: No Frontal/maxillary tenderness ENT/Mouth: Ext aud canals clear, TMs without erythema, bulging. No erythema, swelling, or exudate on post pharynx.  Tonsils not swollen or erythematous. Hearing normal.  Neck: Supple, thyroid normal.  Respiratory: Respiratory effort normal, BS equal bilaterally without rales, rhonchi, or stridor. She does have mild wheezing bil.  Cardio: RRR with no MRGs. Brisk peripheral pulses without edema.  Abdomen: Soft, + BS.  Non tender, no guarding, rebound, hernias, masses. Lymphatics: Non tender without lymphadenopathy.  Musculoskeletal: Full ROM, 5/5 strength, Normal gait Skin: Warm, dry without rashes, lesions, ecchymosis.  Neuro: Cranial nerves intact. No cerebellar  symptoms.  Psych: Awake and oriented X 3, normal affect, Insight and Judgment appropriate.    AIzora Ribas NP 8:57 AM [facility]

## 2019-12-01 ENCOUNTER — Other Ambulatory Visit: Payer: Self-pay

## 2019-12-01 ENCOUNTER — Encounter: Payer: Self-pay | Admitting: Adult Health

## 2019-12-01 ENCOUNTER — Ambulatory Visit (INDEPENDENT_AMBULATORY_CARE_PROVIDER_SITE_OTHER): Payer: Medicare Other | Admitting: Adult Health

## 2019-12-01 VITALS — BP 124/76 | HR 100 | Temp 97.6°F | Wt 177.0 lb

## 2019-12-01 DIAGNOSIS — E663 Overweight: Secondary | ICD-10-CM

## 2019-12-01 DIAGNOSIS — E785 Hyperlipidemia, unspecified: Secondary | ICD-10-CM

## 2019-12-01 DIAGNOSIS — G4700 Insomnia, unspecified: Secondary | ICD-10-CM

## 2019-12-01 DIAGNOSIS — N182 Chronic kidney disease, stage 2 (mild): Secondary | ICD-10-CM

## 2019-12-01 DIAGNOSIS — E1121 Type 2 diabetes mellitus with diabetic nephropathy: Secondary | ICD-10-CM | POA: Diagnosis not present

## 2019-12-01 DIAGNOSIS — E1169 Type 2 diabetes mellitus with other specified complication: Secondary | ICD-10-CM

## 2019-12-01 DIAGNOSIS — E1122 Type 2 diabetes mellitus with diabetic chronic kidney disease: Secondary | ICD-10-CM

## 2019-12-01 DIAGNOSIS — E559 Vitamin D deficiency, unspecified: Secondary | ICD-10-CM

## 2019-12-01 DIAGNOSIS — I10 Essential (primary) hypertension: Secondary | ICD-10-CM

## 2019-12-01 DIAGNOSIS — Z79899 Other long term (current) drug therapy: Secondary | ICD-10-CM

## 2019-12-01 DIAGNOSIS — J449 Chronic obstructive pulmonary disease, unspecified: Secondary | ICD-10-CM

## 2019-12-01 MED ORDER — METFORMIN HCL ER 500 MG PO TB24: ORAL_TABLET | ORAL | 1 refills | Status: AC

## 2019-12-01 NOTE — Patient Instructions (Addendum)
Goals    .  Weight (lb) < 165 lb (74.8 kg) (pt-stated)          Medicines you can use  Nasal congestion  Little Remedies saline spray (aerosol/mist)- can try this, it is in the kids section - pseudoephedrine (Sudafed)- behind the counter, do not use if you have high blood pressure, medicine that have -D in them.  - phenylephrine (Sudafed PE) -Dextormethorphan + chlorpheniramine (Coridcidin HBP)- okay if you have high blood pressure -Oxymetazoline (Afrin) nasal spray- LIMIT to 3 days -Saline nasal spray -Neti pot (used distilled or bottled water)  Ear pain/congestion  -pseudoephedrine (sudafed) - Nasonex/flonase nasal spray  Sore Throat  -Acetaminophen (Tyelnol) -Ibuprofen (Advil, motrin, aleve) -Drink a lot of water -Gargle with salt water - Rest your voice (don't talk) -Throat sprays -Cough drops  Cough  -Dextromethorphan (Delsym)- medicine that has DM in it -Guafenesin (Mucinex/Robitussin) - cough drops - drink lots of water  Chest Congestion  -Guafenesin (Mucinex/Robitussin)  Red Itchy Eyes  - Naphcon-A  General health when sick  -Hydration -wash your hands frequently -keep surfaces clean -change pillow cases and sheets often -Get fresh air but do not exercise strenuously -Vitamin D, double up on it - Vitamin C -Zinc        High-Fiber Diet Fiber, also called dietary fiber, is a type of carbohydrate that is found in fruits, vegetables, whole grains, and beans. A high-fiber diet can have many health benefits. Your health care provider may recommend a high-fiber diet to help:  Prevent constipation. Fiber can make your bowel movements more regular.  Lower your cholesterol.  Relieve the following conditions: ? Swelling of veins in the anus (hemorrhoids). ? Swelling and irritation (inflammation) of specific areas of the digestive tract (uncomplicated diverticulosis). ? A problem of the large intestine (colon) that sometimes causes pain and diarrhea  (irritable bowel syndrome, IBS).  Prevent overeating as part of a weight-loss plan.  Prevent heart disease, type 2 diabetes, and certain cancers. What is my plan? The recommended daily fiber intake in grams (g) includes:  38 g for men age 58 or younger.  30 g for men over age 25.  54 g for women age 72 or younger.  21 g for women over age 68. You can get the recommended daily intake of dietary fiber by:  Eating a variety of fruits, vegetables, grains, and beans.  Taking a fiber supplement, if it is not possible to get enough fiber through your diet. What do I need to know about a high-fiber diet?  It is better to get fiber through food sources rather than from fiber supplements. There is not a lot of research about how effective supplements are.  Always check the fiber content on the nutrition facts label of any prepackaged food. Look for foods that contain 5 g of fiber or more per serving.  Talk with a diet and nutrition specialist (dietitian) if you have questions about specific foods that are recommended or not recommended for your medical condition, especially if those foods are not listed below.  Gradually increase how much fiber you consume. If you increase your intake of dietary fiber too quickly, you may have bloating, cramping, or gas.  Drink plenty of water. Water helps you to digest fiber. What are tips for following this plan?  Eat a wide variety of high-fiber foods.  Make sure that half of the grains that you eat each day are whole grains.  Eat breads and cereals that are made with  whole-grain flour instead of refined flour or white flour.  Eat brown rice, bulgur wheat, or millet instead of white rice.  Start the day with a breakfast that is high in fiber, such as a cereal that contains 5 g of fiber or more per serving.  Use beans in place of meat in soups, salads, and pasta dishes.  Eat high-fiber snacks, such as berries, raw vegetables, nuts, and  popcorn.  Choose whole fruits and vegetables instead of processed forms like juice or sauce. What foods can I eat?  Fruits Berries. Pears. Apples. Oranges. Avocado. Prunes and raisins. Dried figs. Vegetables Sweet potatoes. Spinach. Kale. Artichokes. Cabbage. Broccoli. Cauliflower. Green peas. Carrots. Squash. Grains Whole-grain breads. Multigrain cereal. Oats and oatmeal. Brown rice. Barley. Bulgur wheat. [place]. Quinoa. Bran muffins. Popcorn. Rye wafer crackers. Meats and other proteins Navy, kidney, and pinto beans. Soybeans. Split peas. Lentils. Nuts and seeds. Dairy Fiber-fortified yogurt. Beverages Fiber-fortified soy milk. Fiber-fortified orange juice. Other foods Fiber bars. The items listed above may not be a complete list of recommended foods and beverages. Contact a dietitian for more options. What foods are not recommended? Fruits Fruit juice. Cooked, strained fruit. Vegetables Fried potatoes. Canned vegetables. Well-cooked vegetables. Grains White bread. Pasta made with refined flour. White rice. Meats and other proteins Fatty cuts of meat. Fried chicken or fried fish. Dairy Milk. Yogurt. Cream cheese. Sour cream. Fats and oils Butters. Beverages Soft drinks. Other foods Cakes and pastries. The items listed above may not be a complete list of foods and beverages to avoid. Contact a dietitian for more information. Summary  Fiber is a type of carbohydrate. It is found in fruits, vegetables, whole grains, and beans.  There are many health benefits of eating a high-fiber diet, such as preventing constipation, lowering blood cholesterol, helping with weight loss, and reducing your risk of heart disease, diabetes, and certain cancers.  Gradually increase your intake of fiber. Increasing too fast can result in cramping, bloating, and gas. Drink plenty of water while you increase your fiber.  The best sources of fiber include whole fruits and vegetables, whole  grains, nuts, seeds, and beans. This information is not intended to replace advice given to you by your health care provider. Make sure you discuss any questions you have with your health care provider. Document Revised: 01/06/2017 Document Reviewed: 01/06/2017 Elsevier Patient Education  2020 [employer].

## 2019-12-02 ENCOUNTER — Other Ambulatory Visit: Payer: Self-pay | Admitting: Adult Health

## 2019-12-02 LAB — CBC WITH DIFFERENTIAL/PLATELET
Absolute Monocytes: 394 cells/uL (ref 200–950)
Basophils Absolute: 39 cells/uL (ref 0–200)
Basophils Relative: 1 %
Eosinophils Absolute: 109 cells/uL (ref 15–500)
Eosinophils Relative: 2.8 %
HCT: 39.3 % (ref 35.0–45.0)
Hemoglobin: 12.7 g/dL (ref 11.7–15.5)
Lymphs Abs: 1470 cells/uL (ref 850–3900)
MCH: 29.9 pg (ref 27.0–33.0)
MCHC: 32.3 g/dL (ref 32.0–36.0)
MCV: 92.5 fL (ref 80.0–100.0)
MPV: 10.3 fL (ref 7.5–12.5)
Monocytes Relative: 10.1 %
Neutro Abs: 1888 cells/uL (ref 1500–7800)
Neutrophils Relative %: 48.4 %
Platelets: 231 10*3/uL (ref 140–400)
RBC: 4.25 10*6/uL (ref 3.80–5.10)
RDW: 13.3 % (ref 11.0–15.0)
Total Lymphocyte: 37.7 %
WBC: 3.9 10*3/uL (ref 3.8–10.8)

## 2019-12-02 LAB — MAGNESIUM: Magnesium: 2.2 mg/dL (ref 1.5–2.5)

## 2019-12-02 LAB — LIPID PANEL
Cholesterol: 185 mg/dL (ref <200)
HDL: 55 mg/dL (ref >=50)
LDL Cholesterol (Calc): 107 mg/dL (calc) — ABNORMAL HIGH
Non-HDL Cholesterol (Calc): 130 mg/dL (calc) — ABNORMAL HIGH (ref <130)
Total CHOL/HDL Ratio: 3.4 (calc) (ref <5.0)
Triglycerides: 121 mg/dL (ref <150)

## 2019-12-02 LAB — COMPLETE METABOLIC PANEL WITH GFR
AG Ratio: 1.8 (calc) (ref 1.0–2.5)
ALT: 30 U/L — ABNORMAL HIGH (ref 6–29)
AST: 24 U/L (ref 10–35)
Albumin: 4.4 g/dL (ref 3.6–5.1)
Alkaline phosphatase (APISO): 80 U/L (ref 37–153)
BUN: 13 mg/dL (ref 7–25)
CO2: 30 mmol/L (ref 20–32)
Calcium: 9.8 mg/dL (ref 8.6–10.4)
Chloride: 105 mmol/L (ref 98–110)
Creat: 0.83 mg/dL (ref 0.60–0.93)
GFR, Est African American: 82 mL/min/{1.73_m2} (ref >=60)
GFR, Est Non African American: 71 mL/min/{1.73_m2} (ref >=60)
Globulin: 2.5 g/dL (calc) (ref 1.9–3.7)
Glucose, Bld: 109 mg/dL — ABNORMAL HIGH (ref 65–99)
Potassium: 4.5 mmol/L (ref 3.5–5.3)
Sodium: 140 mmol/L (ref 135–146)
Total Bilirubin: 0.6 mg/dL (ref 0.2–1.2)
Total Protein: 6.9 g/dL (ref 6.1–8.1)

## 2019-12-02 LAB — HEMOGLOBIN A1C
Hgb A1c MFr Bld: 6.6 % of total Hgb — ABNORMAL HIGH (ref <5.7)
Mean Plasma Glucose: 143 (calc)
eAG (mmol/L): 7.9 (calc)

## 2019-12-02 LAB — TSH: TSH: 1.47 mIU/L (ref 0.40–4.50)

## 2019-12-02 MED ORDER — ROSUVASTATIN CALCIUM 40 MG PO TABS: 40.0000 mg | ORAL_TABLET | Freq: Every day | ORAL | 1 refills | Status: DC

## 2019-12-07 ENCOUNTER — Other Ambulatory Visit: Payer: Self-pay | Admitting: Adult Health

## 2020-01-13 ENCOUNTER — Other Ambulatory Visit: Payer: Self-pay | Admitting: Internal Medicine

## 2020-02-18 ENCOUNTER — Ambulatory Visit: Payer: Medicare Other | Admitting: Internal Medicine

## 2020-03-02 ENCOUNTER — Encounter: Payer: Self-pay | Admitting: Internal Medicine

## 2020-03-02 ENCOUNTER — Ambulatory Visit (INDEPENDENT_AMBULATORY_CARE_PROVIDER_SITE_OTHER): Payer: Medicare Other | Admitting: Internal Medicine

## 2020-03-02 ENCOUNTER — Other Ambulatory Visit: Payer: Self-pay

## 2020-03-02 VITALS — BP 120/78 | HR 53 | Temp 97.5°F | Ht 65.5 in | Wt 173.0 lb

## 2020-03-02 DIAGNOSIS — E1122 Type 2 diabetes mellitus with diabetic chronic kidney disease: Secondary | ICD-10-CM | POA: Diagnosis not present

## 2020-03-02 DIAGNOSIS — E559 Vitamin D deficiency, unspecified: Secondary | ICD-10-CM

## 2020-03-02 DIAGNOSIS — K219 Gastro-esophageal reflux disease without esophagitis: Secondary | ICD-10-CM

## 2020-03-02 DIAGNOSIS — Z79899 Other long term (current) drug therapy: Secondary | ICD-10-CM

## 2020-03-02 DIAGNOSIS — I10 Essential (primary) hypertension: Secondary | ICD-10-CM | POA: Diagnosis not present

## 2020-03-02 DIAGNOSIS — E785 Hyperlipidemia, unspecified: Secondary | ICD-10-CM | POA: Diagnosis not present

## 2020-03-02 DIAGNOSIS — E1169 Type 2 diabetes mellitus with other specified complication: Secondary | ICD-10-CM | POA: Diagnosis not present

## 2020-03-02 DIAGNOSIS — N182 Chronic kidney disease, stage 2 (mild): Secondary | ICD-10-CM

## 2020-03-02 NOTE — Patient Instructions (Signed)

## 2020-03-02 NOTE — Progress Notes (Signed)
History of Present Illness:       This very nice 74 y.o.  DBF presents for 6 month follow up with HTN, HLD, T2_NIDDM and Vitamin D Deficiency.       Patient is treated for HTN (2003)  & BP has been controlled at home. Today's BP is at goal  - 120/78. Patient has had no complaints of any cardiac type chest pain, palpitations, dyspnea / orthopnea / PND, dizziness, claudication, or dependent edema.      Hyperlipidemia is not controlled with diet & meds. Patient denies myalgias or other med SE's. Last Lipids were not at goal:  Lab Results  Component Value Date   CHOL 185 12/01/2019   HDL 55 12/01/2019   LDL 107 (H) 12/01/2019   TRIG 121 12/01/2019   CHOL / HDL 3.4 12/01/2019    Also, the patient has history of T2_NIDDM (2011) w/CKD2 (GFR 82)  and has had no symptoms of reactive hypoglycemia, diabetic polys, paresthesias or visual blurring.  Last A1c was   Lab Results  Component Value Date   HGBA1C 6.6 (H) 12/01/2019       Further, the patient also has history of Vitamin D Deficiency and supplements vitamin D without any suspected side-effects. Last vitamin D was at goal:  Lab Results  Component Value Date   VD25OH 87 08/17/2019    Current Outpatient Medications on File Prior to Visit  Medication Sig  . Accu-Chek Softclix Lancets lancets CHECK BLOOD GLUCOSE 1 TIME EVERY DAY. [id number]  . acetaminophen  650 MG CR tablet Take 1,300 mg  every 8  hours as needed  . albuterol 2.5 MG/3ML neb soln Take 3 mLs  by neb every 6  hours as needed   . VITAMIN C  500 mg  Take daily.   . baclofen  10 MG  Take 1/2 - 1 tablet 2 - 3 x /day ONLY if needed   . benazepril-hctz 20-25 MG  Take 1 tablet Daily for BP  . ALPHAGAN ophth soln Place 1 drop into both eyes daily.  Darron Doom w/D 500-200 MG-u Take 1 tablet by mouth daily with breakfast.  . VITAMIN D 5000 units  Take 10,000 Units  daily.   . COSOPT  ophth soln Place 1 drop into both eyes 2 times daily.  . famotidine  40 MG  Take     1  tablet      Daily  . FLONASE  nasal spray 2 SPRAYS IN EACH NOSTRIL DAILY  . gabapentin  600 MG  Take 1/2 to 1 tablet  2 to3 x /day if needed for pain  . XALATAN ophth soln Place 1 drop into both eyes at bedtime.  . ATIVAN 1 MG  Take 1/2 - 1 tablet 2 - 3 x /day ONLY if needed   . metFORMIN-XR 500 MG Take 1 tab daily with meal for Diabetes  . montelukast  10 MG tablet Take    1 tablet     Daily      for  Allergies  . Multi-Vit w/Min  Take 1 tablet  daily.  . rosuvastatin  40 MG tablet Take 1 tablet daily.     Allergies  Allergen Reactions  . Penicillins Hives and Shortness Of Breath  . Aspirin Other (See Comments)    Stomach upset, but low dose is ok  . Hydromorphone Nausea Only    Other reaction(s): Sweating (intolerance)  . Sulfa Antibiotics Other (See Comments)  unknown  . Codeine Rash    PMHx:   Past Medical History:  Diagnosis Date  . Anxiety 07/23/2017  . Asthma   . Bronchitis    hx of;>39yrs ago  . Chronic neck pain    arthritis  . COPD (chronic obstructive pulmonary disease) ([place])   . Degenerative disc disease    neck  . DeQuervain's disease (tenosynovitis)   . Diverticulitis   . Dizziness    related to neck issues  . Frozen shoulder September 2012  . GERD (gastroesophageal reflux disease)    takes Ranitidine nightly  . Glaucoma   . Hay fever   . Headache(784.0)    related neck issues  . High cholesterol    takes Lipitor daily  . History of shingles 2012  . Hypertension    takes Lotensin HCT daily  . Insomnia    d/t pain and takes Ativan nightly  . Osteoporosis   . Sarcoidosis >65yrs ago   no problems since  . Type II or unspecified type diabetes mellitus without mention of complication, not stated as uncontrolled    takes Metformin nightly    Immunization History  Administered Date(s) Administered  . Influenza, High Dose Seasonal PF 01/04/2015, 12/31/2016, 12/05/2017, 02/10/2019  . Influenza-Unspecified 03/01/2016  . PFIZER SARS-COV-2 Vaccination  10/14/2019, 11/17/2019  . Pneumococcal Conjugate-13 11/16/2013  . Pneumococcal Polysaccharide-23 04/12/2015  . Pneumococcal-Unspecified 03/18/2005  . Tdap 03/18/2009  . Zoster 11/13/2012    Past Surgical History:  Procedure Laterality Date  . ABDOMINAL HYSTERECTOMY  1975  . ANTERIOR CERVICAL DECOMP/DISCECTOMY FUSION  07/31/2011   Procedure: ANTERIOR CERVICAL DECOMPRESSION/DISCECTOMY FUSION 3 LEVELS;  Surgeon: Hosie Spangle, MD;  Location: [facility];  Service: Neurosurgery;  Laterality: N/A;  Cervical four-five,Cervical five-six,Cervical six-seven anterior cervical decompression with fusion plating and bonegraft  . BIOPSY  10/31/2017   Procedure: BIOPSY;  Surgeon: Ileana Roup, MD;  Location: [facility];  Service: General;;  . BREAST LUMPECTOMY Left   . BREAST SURGERY  20+yrs ago   left breast;benign  . CARPAL TUNNEL RELEASE  2012  . COLONOSCOPY    . COLONOSCOPY WITH PROPOFOL N/A 10/31/2017   Procedure: COLONOSCOPY SCREENING;  Surgeon: Ileana Roup, MD;  Location: [facility];  Service: General;  Laterality: N/A;  . REFRACTIVE SURGERY  2011   bilateral  . TONSILLECTOMY  30+yrs ago  . WRIST SURGERY     ORIF left wrist and then removed d/t protruding screw in 2012    FHx:    Reviewed / unchanged  SHx:    Reviewed / unchanged   Systems Review:  Constitutional: Denies fever, chills, wt changes, headaches, insomnia, fatigue, night sweats, change in appetite. Eyes: Denies redness, blurred vision, diplopia, discharge, itchy, watery eyes.  ENT: Denies discharge, congestion, post nasal drip, epistaxis, sore throat, earache, hearing loss, dental pain, tinnitus, vertigo, sinus pain, snoring.  CV: Denies chest pain, palpitations, irregular heartbeat, syncope, dyspnea, diaphoresis, orthopnea, PND, claudication or edema. Respiratory: denies cough, dyspnea, DOE, pleurisy, hoarseness, laryngitis, wheezing.  Gastrointestinal: Denies dysphagia, odynophagia, heartburn,  reflux, water brash, abdominal pain or cramps, nausea, vomiting, bloating, diarrhea, constipation, hematemesis, melena, hematochezia  or hemorrhoids. Genitourinary: Denies dysuria, frequency, urgency, nocturia, hesitancy, discharge, hematuria or flank pain. Musculoskeletal: Denies arthralgias, myalgias, stiffness, jt. swelling, pain, limping or strain/sprain.  Skin: Denies pruritus, rash, hives, warts, acne, eczema or change in skin lesion(s). Neuro: No weakness, tremor, incoordination, spasms, paresthesia or pain. Psychiatric: Denies confusion, memory loss or sensory loss. Endo: Denies change in weight, skin  or hair change.  Heme/Lymph: No excessive bleeding, bruising or enlarged lymph nodes.  Physical Exam  BP 120/78   Pulse (!) 53   Temp (!) 97.5 F (36.4 C)   Wt 173 lb (78.5 kg)   BMI 29.01 kg/m   Appears  well nourished, well groomed  and in no distress.  Eyes: PERRLA, EOMs, conjunctiva no swelling or erythema. Sinuses: No frontal/maxillary tenderness ENT/Mouth: EAC's clear, TM's nl w/o erythema, bulging. Nares clear w/o erythema, swelling, exudates. Oropharynx clear without erythema or exudates. Oral hygiene is good. Tongue normal, non obstructing. Hearing intact.  Neck: Supple. Thyroid not palpable. Car 2+/2+ without bruits, nodes or JVD. Chest: Respirations nl with BS clear & equal w/o rales, rhonchi, wheezing or stridor.  Cor: Heart sounds normal w/ regular rate and rhythm without sig. murmurs, gallops, clicks or rubs. Peripheral pulses normal and equal  without edema.  Abdomen: Soft & bowel sounds normal. Non-tender w/o guarding, rebound, hernias, masses or organomegaly.  Lymphatics: Unremarkable.  Musculoskeletal: Full ROM all peripheral extremities, joint stability, 5/5 strength and normal gait.  Skin: Warm, dry without exposed rashes, lesions or ecchymosis apparent.  Neuro: Cranial nerves intact, reflexes equal bilaterally. Sensory-motor testing grossly intact. Tendon  reflexes grossly intact.  Pysch: Alert & oriented x 3.  Insight and judgement nl & appropriate. No ideations.   Assessment and Plan:  1. Essential hypertension  - Continue medication, monitor blood pressure at home.  - Continue DASH diet.  Reminder to go to the ER if any CP,  SOB, nausea, dizziness, severe HA, changes vision/speech.  - CBC with Differential/Platelet - COMPLETE METABOLIC PANEL WITH GFR - Magnesium - TSH  2. Hyperlipidemia associated with type 2 diabetes mellitus ([place])  - Continue diet/meds, exercise,& lifestyle modifications.  - Continue monitor periodic cholesterol/liver & renal functions   - Lipid panel - TSH  3. Type 2 diabetes mellitus with stage 2 chronic kidney  disease, without long-term current use of insulin ([facility])  - Continue diet, exercise  - Lifestyle modifications.  - Monitor appropriate labs.  - Hemoglobin A1c - Insulin, random  4. Vitamin D deficiency  - Continue supplementation.  - VITAMIN D 25 Hydroxy   5. Gastroesophageal reflux disease  - CBC with Differential/Platelet  6. Medication management  - CBC with Differential/Platelet - COMPLETE METABOLIC PANEL WITH GFR - Magnesium - Lipid panel - TSH - Hemoglobin A1c - Insulin, random - VITAMIN D 25 Hydroxy          Discussed  regular exercise, BP monitoring, weight control to achieve/maintain BMI less than 25 and discussed med and SE's. Recommended labs to assess and monitor clinical status with further disposition pending results of labs.  I discussed the assessment and treatment plan with the patient. The patient was provided an opportunity to ask questions and all were answered. The patient agreed with the plan and demonstrated an understanding of the instructions.  I provided over 30 minutes of exam, counseling, chart review and  complex critical decision making.   Kirtland Bouchard, MD

## 2020-03-03 ENCOUNTER — Other Ambulatory Visit: Payer: Medicare Other

## 2020-03-03 ENCOUNTER — Other Ambulatory Visit: Payer: Self-pay | Admitting: Internal Medicine

## 2020-03-03 ENCOUNTER — Ambulatory Visit: Payer: Medicare Other

## 2020-03-03 ENCOUNTER — Other Ambulatory Visit: Payer: Self-pay | Admitting: Adult Health Nurse Practitioner

## 2020-03-03 DIAGNOSIS — M47816 Spondylosis without myelopathy or radiculopathy, lumbar region: Secondary | ICD-10-CM

## 2020-03-03 DIAGNOSIS — M533 Sacrococcygeal disorders, not elsewhere classified: Secondary | ICD-10-CM

## 2020-03-03 DIAGNOSIS — I10 Essential (primary) hypertension: Secondary | ICD-10-CM

## 2020-03-03 LAB — COMPLETE METABOLIC PANEL WITH GFR
AG Ratio: 1.8 (calc) (ref 1.0–2.5)
ALT: 16 U/L (ref 6–29)
AST: 18 U/L (ref 10–35)
Albumin: 4.2 g/dL (ref 3.6–5.1)
Alkaline phosphatase (APISO): 58 U/L (ref 37–153)
BUN: 11 mg/dL (ref 7–25)
CO2: 29 mmol/L (ref 20–32)
Calcium: 10.1 mg/dL (ref 8.6–10.4)
Chloride: 104 mmol/L (ref 98–110)
Creat: 0.82 mg/dL (ref 0.60–0.93)
GFR, Est African American: 83 mL/min/{1.73_m2} (ref >=60)
GFR, Est Non African American: 71 mL/min/{1.73_m2} (ref >=60)
Globulin: 2.4 g/dL (calc) (ref 1.9–3.7)
Glucose, Bld: 125 mg/dL — ABNORMAL HIGH (ref 65–99)
Potassium: 4.1 mmol/L (ref 3.5–5.3)
Sodium: 139 mmol/L (ref 135–146)
Total Bilirubin: 0.6 mg/dL (ref 0.2–1.2)
Total Protein: 6.6 g/dL (ref 6.1–8.1)

## 2020-03-03 LAB — CBC WITH DIFFERENTIAL/PLATELET
Absolute Monocytes: 339 cells/uL (ref 200–950)
Basophils Absolute: 20 cells/uL (ref 0–200)
Basophils Relative: 0.5 %
Eosinophils Absolute: 70 cells/uL (ref 15–500)
Eosinophils Relative: 1.8 %
HCT: 39.7 % (ref 35.0–45.0)
Hemoglobin: 13.2 g/dL (ref 11.7–15.5)
Lymphs Abs: 1474 cells/uL (ref 850–3900)
MCH: 29.9 pg (ref 27.0–33.0)
MCHC: 33.2 g/dL (ref 32.0–36.0)
MCV: 89.8 fL (ref 80.0–100.0)
MPV: 10.1 fL (ref 7.5–12.5)
Monocytes Relative: 8.7 %
Neutro Abs: 1997 cells/uL (ref 1500–7800)
Neutrophils Relative %: 51.2 %
Platelets: 228 10*3/uL (ref 140–400)
RBC: 4.42 10*6/uL (ref 3.80–5.10)
RDW: 12.6 % (ref 11.0–15.0)
Total Lymphocyte: 37.8 %
WBC: 3.9 10*3/uL (ref 3.8–10.8)

## 2020-03-03 LAB — INSULIN, RANDOM: Insulin: 20.8 u[IU]/mL — ABNORMAL HIGH

## 2020-03-03 LAB — HEMOGLOBIN A1C
Hgb A1c MFr Bld: 6.8 % of total Hgb — ABNORMAL HIGH (ref <5.7)
Mean Plasma Glucose: 148 mg/dL
eAG (mmol/L): 8.2 mmol/L

## 2020-03-03 LAB — LIPID PANEL
Cholesterol: 138 mg/dL (ref <200)
HDL: 53 mg/dL (ref >=50)
LDL Cholesterol (Calc): 69 mg/dL (calc)
Non-HDL Cholesterol (Calc): 85 mg/dL (calc) (ref <130)
Total CHOL/HDL Ratio: 2.6 (calc) (ref <5.0)
Triglycerides: 84 mg/dL (ref <150)

## 2020-03-03 LAB — MAGNESIUM: Magnesium: 2.1 mg/dL (ref 1.5–2.5)

## 2020-03-03 LAB — TSH: TSH: 1.42 mIU/L (ref 0.40–4.50)

## 2020-03-03 LAB — VITAMIN D 25 HYDROXY (VIT D DEFICIENCY, FRACTURES): Vit D, 25-Hydroxy: 83 ng/mL (ref 30–100)

## 2020-03-03 MED ORDER — DICLOFENAC SODIUM 1 % EX GEL: 2.0000 g | Freq: Four times a day (QID) | CUTANEOUS | 0 refills | Status: AC

## 2020-03-03 NOTE — Progress Notes (Signed)
========================================================== - Test results slightly outside the reference range are not unusual. If there is anything important, I will review this with you,  otherwise it is considered normal test values.  If you have further questions,  please do not hesitate to contact me at the office or via My Chart.  ==========================================================  -  Total Chol =138 and LDL Chol = 69 - Both -  Excellent   - Very low risk for Heart Attack  / Stroke ========================================================  - A1c = 6.8%  - Recommend increase your Metformin from   1 tablet up to 4 tablets /day   (Ideal or Goal A1c is below 5.7%) ==========================================================  -  Being diabetic has a  300% increased risk for heart attack,  stroke, cancer, and alzheimer- type vascular dementia.   - It is very important that you work harder with diet by  avoiding all foods that are white except chicken,   fish & calliflower.  - Avoid white rice  (brown & wild rice is OK),   - Avoid white potatoes  (sweet potatoes in moderation is OK),   White bread or wheat bread or anything made out of   white flour like bagels, donuts, rolls, buns, biscuits, cakes,  - pastries, cookies, pizza crust, and pasta (made from  white flour & egg whites)   - vegetarian pasta or spinach or wheat pasta is OK.  - Multigrain breads like Arnold's, Pepperidge Farm or   multigrain sandwich thins or high fiber breads like   [place] bread or "Dave's Killer" breads that are  4 to 5 grams fiber per slice !  are best.    Diet, exercise and weight loss can reverse and cure  diabetes in the early stages.    - Diet, exercise and weight loss is very important in the   control and prevention of complications of diabetes which  affects every system in your body, ie.   -Brain - dementia/stroke,  - eyes - glaucoma/blindness,  - heart - heart  attack/heart failure,  - kidneys - dialysis,  - stomach - gastric paralysis,  - intestines - malabsorption,  - nerves - severe painful neuritis,  - circulation - gangrene & loss of a leg(s)  - and finally  . . . . . . . . . . . . . . . . . .    - cancer and Alzheimers. ==========================================================  -  Vitamin D = 83 - Excellent   - Vitamin D goal is between 70-100.   - It is very important as a natural anti-inflammatory and helping the  immune system protect against viral infections, like the Covid-19    helping hair, skin, and nails, as well as reducing stroke and  heart attack risk.   - It helps your bones and helps with mood.  - It also decreases numerous cancer risks so please  take it as directed.   - Low Vit D is associated with a 200-300% higher risk for  CANCER   and 200-300% higher risk for HEART   ATTACK  &  STROKE.    - It is also associated with higher death rate at younger ages,   autoimmune diseases like Rheumatoid arthritis, Lupus,  Multiple Sclerosis.     - Also many other serious conditions, like depression, Alzheimer's  Dementia, infertility, muscle aches, fatigue, fibromyalgia   - just to name a few. ==========================================================  - All Else - CBC - Kidneys - Electrolytes - Liver - Magnesium &  Thyroid    - all  Normal / OK ==========================================================

## 2020-03-08 ENCOUNTER — Encounter: Payer: Self-pay | Admitting: *Deleted

## 2020-03-09 DIAGNOSIS — Z1231 Encounter for screening mammogram for malignant neoplasm of breast: Secondary | ICD-10-CM | POA: Diagnosis not present

## 2020-03-09 LAB — HM MAMMOGRAPHY

## 2020-03-31 ENCOUNTER — Ambulatory Visit: Payer: Medicare Other | Admitting: Orthopaedic Surgery

## 2020-04-05 ENCOUNTER — Ambulatory Visit (INDEPENDENT_AMBULATORY_CARE_PROVIDER_SITE_OTHER): Payer: Medicare HMO

## 2020-04-05 ENCOUNTER — Telehealth: Payer: Self-pay

## 2020-04-05 ENCOUNTER — Ambulatory Visit: Payer: Medicare HMO | Admitting: Orthopaedic Surgery

## 2020-04-05 VITALS — BP 116/73 | HR 87 | Ht 65.0 in | Wt 173.0 lb

## 2020-04-05 DIAGNOSIS — M542 Cervicalgia: Secondary | ICD-10-CM | POA: Diagnosis not present

## 2020-04-05 DIAGNOSIS — M25512 Pain in left shoulder: Secondary | ICD-10-CM

## 2020-04-05 DIAGNOSIS — G8929 Other chronic pain: Secondary | ICD-10-CM

## 2020-04-05 DIAGNOSIS — M7542 Impingement syndrome of left shoulder: Secondary | ICD-10-CM | POA: Diagnosis not present

## 2020-04-05 MED ORDER — LIDOCAINE HCL 1 % IJ SOLN
0.5000 mL | INTRAMUSCULAR | Status: AC | PRN
  Administered 2020-04-05: .5 mL

## 2020-04-05 MED ORDER — METHYLPREDNISOLONE ACETATE 40 MG/ML IJ SUSP
40.0000 mg | INTRAMUSCULAR | Status: AC | PRN
  Administered 2020-04-05: 40 mg via INTRA_ARTICULAR

## 2020-04-05 MED ORDER — BUPIVACAINE HCL 0.25 % IJ SOLN
4.0000 mL | INTRAMUSCULAR | Status: AC | PRN
Start: 2020-04-05 — End: 2020-04-05
  Administered 2020-04-05: 4 mL via INTRA_ARTICULAR

## 2020-04-05 NOTE — Telephone Encounter (Signed)
Faxed OV note from today °

## 2020-04-05 NOTE — Telephone Encounter (Signed)
Patient was seen in our office today she would like the results from her xray to be sent to her pcp Deland Pretty at [facility] Fax:Fax: [phone]

## 2020-04-05 NOTE — Progress Notes (Signed)
Office Visit Note   Patient: Sara [name]           Date of Birth: [birth year]           MRN: [id number] Visit Date: 04/05/2020              Requested by: Unk Pinto, [address] [place],  [place] [address] PCP: Unk Pinto, MD   Assessment & Plan: Visit Diagnoses:  1. Chronic left shoulder pain   2. Neck pain   3. Impingement syndrome of left shoulder     Plan: Left subacromial injection performed.  If she has persistent problems she will call let us know.  Cervical fusion looks good.  Patient will check her sugars for the next several days make sure she has no hyperglycemia since her A1c has been in control.  If she has persistent problems with her shoulder and injection is not effective she will call us and we can consider left shoulder MRI imaging.  Follow-Up Instructions: No follow-ups on file.   Orders:  Orders Placed This Encounter  Procedures  . Large Joint Inj  . XR Shoulder Left  . XR Cervical Spine 2 or 3 views   No orders of the defined types were placed in this encounter.     Procedures: Large Joint Inj: L subacromial bursa on 04/05/2020 11:04 AM Indications: pain Details: 22 G 1.5 in needle  Arthrogram: No  Medications: 4 mL bupivacaine 0.25 %; 40 mg methylPREDNISolone acetate 40 MG/ML; 0.5 mL lidocaine 1 % Outcome: tolerated well, no immediate complications Procedure, treatment alternatives, risks and benefits explained, specific risks discussed. Consent was given by the patient. Immediately prior to procedure a time out was called to verify the correct patient, procedure, equipment, support staff and site/side marked as required. Patient was prepped and draped in the usual sterile fashion.       Clinical Data: No additional findings.   Subjective: Chief Complaint  Patient presents with  . Left Shoulder - Pain  . Neck - Pain    Hx ACDF 2366    HPI 75 year old female was walking in the summer with her  sister and had knee surgery her sister started slip reached over and suddenly grabbed her left arm with resultant left shoulder pain.  She has had decreased range of motion pain with abduction and pain anteriorly over the shoulder.  She is right-hand dominant.  Previous 3 level fusion by Dr. Ernestina Patches when she thinks in around 2013 which had been doing well.  Recently she noticed a little bit of headache problems.  Opposite right shoulder has full range of motion no gait disturbance.  No numbness or tingling problems in her fingers.  Review of Systems all other systems noncontributory to HPI.  Patient has type 2 diabetes on metformin..Last A1c is 6.8.   Objective: Vital Signs: BP 116/73   Pulse 87   Ht 5\' 5"  (1.651 m)   Wt 173 lb (78.5 kg)   BMI 28.79 kg/m   Physical Exam Constitutional:      Appearance: She is well-developed.  HENT:     Head: Normocephalic.     Right Ear: External ear normal.     Left Ear: External ear normal.  Eyes:     Pupils: Pupils are equal, round, and reactive to light.  Neck:     Thyroid: No thyromegaly.     Trachea: No tracheal deviation.  Cardiovascular:     Rate and Rhythm: Normal rate.  Pulmonary:     Effort: Pulmonary effort is normal.  Abdominal:     Palpations: Abdomen is soft.  Skin:    General: Skin is warm and dry.  Neurological:     Mental Status: She is alert and oriented to person, place, and time.  Psychiatric:        Mood and Affect: Mood and affect normal.        Behavior: Behavior normal.     Ortho Exam positive impingement left shoulder positive Neer test.  Long head of biceps is tender left shoulder only none on the right.  Specialty Comments:  No specialty comments available.  Imaging: XR Cervical Spine 2 or 3 views  Result Date: 04/05/2020 AP lateral cervical spine images are obtained and reviewed.  This shows previous fusion C4-C7 anterior plate with allograft.  There is good consolidation.  Mild narrowing at C3-4 without  subluxation. Impression: Previous C4-C7 anterior fusion.  Minimal adjacent level changes.  XR Shoulder Left  Result Date: 04/05/2020 Three-view x-rays left shoulder obtained negative for acute changes no glenohumeral or significant acromioclavicular arthritic changes. Impression: Left shoulder negative for acute changes and negative for osteoarthritis.    PMFS History: Patient Active Problem List   Diagnosis Date Noted  . Impingement syndrome of left shoulder 04/05/2020  . Insomnia 07/23/2017  . S/P cervical spinal fusion 07/22/2017  . Osteoporosis 04/14/2017  . Overweight (BMI 25.0-29.9) 01/04/2015  . CKD stage 2 due to type 2 diabetes mellitus ([place]) 11/16/2013  . Vitamin D deficiency 06/04/2013  . Medication management 06/04/2013  . Type II diabetes mellitus with renal manifestations ([place])   . GERD (gastroesophageal reflux disease)   . Glaucoma   . Hyperlipidemia associated with type 2 diabetes mellitus ([place])   . Hypertension   . COPD (chronic obstructive pulmonary disease) ([place])   . Asthma   . Cervical arthritis   . Lumbar spondylosis 03/27/2012  . Disorder of sacroiliac joint 05/31/2011   Past Medical History:  Diagnosis Date  . Anxiety 07/23/2017  . Asthma   . Bronchitis    hx of;>63yrs ago  . Chronic neck pain    arthritis  . COPD (chronic obstructive pulmonary disease) ([place])   . Degenerative disc disease    neck  . DeQuervain's disease (tenosynovitis)   . Diverticulitis   . Dizziness    related to neck issues  . Frozen shoulder September 2012  . GERD (gastroesophageal reflux disease)    takes Ranitidine nightly  . Glaucoma   . Hay fever   . Headache(784.0)    related neck issues  . High cholesterol    takes Lipitor daily  . History of shingles 2012  . Hypertension    takes Lotensin HCT daily  . Insomnia    d/t pain and takes Ativan nightly  . Osteoporosis   . Sarcoidosis >50yrs ago   no problems since  . Type II or unspecified type diabetes mellitus  without mention of complication, not stated as uncontrolled    takes Metformin nightly    Family History  Problem Relation Age of Onset  . Cancer Mother 57       breast cancer  . Pneumonia Father   . Leukemia Father   . Glaucoma Father   . Mitral valve prolapse Sister   . Hypertension Sister   . Pulmonary embolism Sister   . Diabetes Sister   . HIV Son   . Anesthesia problems Neg Hx   . Hypotension Neg Hx   .  Malignant hyperthermia Neg Hx   . Pseudochol deficiency Neg Hx     Past Surgical History:  Procedure Laterality Date  . ABDOMINAL HYSTERECTOMY  1975  . ANTERIOR CERVICAL DECOMP/DISCECTOMY FUSION  07/31/2011   Procedure: ANTERIOR CERVICAL DECOMPRESSION/DISCECTOMY FUSION 3 LEVELS;  Surgeon: Hosie Spangle, MD;  Location: [facility];  Service: Neurosurgery;  Laterality: N/A;  Cervical four-five,Cervical five-six,Cervical six-seven anterior cervical decompression with fusion plating and bonegraft  . BIOPSY  10/31/2017   Procedure: BIOPSY;  Surgeon: Ileana Roup, MD;  Location: [facility];  Service: General;;  . BREAST LUMPECTOMY Left   . BREAST SURGERY  20+yrs ago   left breast;benign  . CARPAL TUNNEL RELEASE  2012  . COLONOSCOPY    . COLONOSCOPY WITH PROPOFOL N/A 10/31/2017   Procedure: COLONOSCOPY SCREENING;  Surgeon: Ileana Roup, MD;  Location: [facility];  Service: General;  Laterality: N/A;  . REFRACTIVE SURGERY  2011   bilateral  . TONSILLECTOMY  30+yrs ago  . WRIST SURGERY     ORIF left wrist and then removed d/t protruding screw in 2012   Social History   Occupational History  . Not on file  Tobacco Use  . Smoking status: Never Smoker  . Smokeless tobacco: Never Used  Substance and Sexual Activity  . Alcohol use: No  . Drug use: No  . Sexual activity: Never    Birth control/protection: Surgical

## 2020-04-07 ENCOUNTER — Other Ambulatory Visit: Payer: Self-pay | Admitting: Internal Medicine

## 2020-05-02 ENCOUNTER — Encounter: Payer: Self-pay | Admitting: Internal Medicine

## 2020-05-05 DIAGNOSIS — E1121 Type 2 diabetes mellitus with diabetic nephropathy: Secondary | ICD-10-CM | POA: Diagnosis not present

## 2020-05-05 DIAGNOSIS — M5441 Lumbago with sciatica, right side: Secondary | ICD-10-CM | POA: Diagnosis not present

## 2020-05-05 DIAGNOSIS — E782 Mixed hyperlipidemia: Secondary | ICD-10-CM | POA: Diagnosis not present

## 2020-05-05 DIAGNOSIS — M810 Age-related osteoporosis without current pathological fracture: Secondary | ICD-10-CM | POA: Diagnosis not present

## 2020-05-05 DIAGNOSIS — S61239A Puncture wound without foreign body of unspecified finger without damage to nail, initial encounter: Secondary | ICD-10-CM | POA: Diagnosis not present

## 2020-05-05 DIAGNOSIS — H409 Unspecified glaucoma: Secondary | ICD-10-CM | POA: Diagnosis not present

## 2020-05-05 DIAGNOSIS — G8929 Other chronic pain: Secondary | ICD-10-CM | POA: Diagnosis not present

## 2020-05-05 DIAGNOSIS — M25512 Pain in left shoulder: Secondary | ICD-10-CM | POA: Diagnosis not present

## 2020-05-05 DIAGNOSIS — Z0000 Encounter for general adult medical examination without abnormal findings: Secondary | ICD-10-CM | POA: Diagnosis not present

## 2020-05-08 ENCOUNTER — Other Ambulatory Visit: Payer: Self-pay | Admitting: Internal Medicine

## 2020-05-08 DIAGNOSIS — E1121 Type 2 diabetes mellitus with diabetic nephropathy: Secondary | ICD-10-CM

## 2020-05-12 DIAGNOSIS — H401133 Primary open-angle glaucoma, bilateral, severe stage: Secondary | ICD-10-CM | POA: Diagnosis not present

## 2020-05-16 NOTE — Progress Notes (Signed)
Subjective:   I, Sara [name], LAT, ATC acting as a scribe for Sara Leader, MD.  I'm seeing this patient as a consultation for Dr. Deland [name]. Note will be routed back to referring provider/PCP.  CC: Chronic left shoulder pain  HPI: Pt is a 75 y/o female c/o L shoulder pain ongoing since summer 2021. MOI: Pt initally reports she fell at work in 2015 and had surgery to repair a fx in L arm. Additional mechanism: Pt was walking with her sister who started to slip and reached over and suddenly grabbed pt's L arm with resultant L shoulder pain. Pt was previously seen by Dr. Lorin [name] on 04/05/20 and was given a subacromial steroid injection. Pt has a PMHx of level 3 cervical fusion. Pt is R-hand dominate. Today, pt locates pain to posterior of GH joint and inferior/lateral border of scapula. Pn worsened 2 weeks ago when pt's sister grabbed arm again.  Radiates: yes Shoulder mechanical symptoms: no UE Numbness/tingling: yes UE Weakness: yes Aggravates: shoulder ABD Treatments tried: steroid injection, PT  Dx imaging: 04/05/20 L shoulder & C-spine XR  05/13/17 L shoulder & C-spine XR  Past medical history, Surgical history, Family history, Social history, Allergies, and medications have been entered into the medical record, reviewed.   Review of Systems: No new headache, visual changes, nausea, vomiting, diarrhea, constipation, dizziness, abdominal pain, skin rash, fevers, chills, night sweats, weight loss, swollen lymph nodes, body aches, joint swelling, muscle aches, chest pain, shortness of breath, mood changes, visual or auditory hallucinations.   Objective:    Vitals:   05/17/20 1255  BP: 102/76  Pulse: 84  SpO2: 96%   General: Well Developed, well nourished, and in no acute distress.  Neuro/Psych: Alert and oriented x3, extra-ocular muscles intact, able to move all 4 extremities, sensation grossly intact. Skin: Warm and dry, no rashes noted.  Respiratory: Not using accessory  muscles, speaking in full sentences, trachea midline.  Cardiovascular: Pulses palpable, no extremity edema. Abdomen: Does not appear distended. MSK: Left shoulder normal-appearing nontender. Range of motion abduction 110 degrees.  External rotation full internal rotation lumbar spine. Strength 4/5 abduction 5/5 external and internal rotation. Positive empty can test. Positive Hawkins and Neer's test. Negative Yergason's and speeds test. Pulses capillary refill and sensation are intact distally.  Lab and Radiology Results  Diagnostic Limited MSK Ultrasound of: Left shoulder Biceps tendon intact and normal appearing Subscapularis tendon is intact. Supraspinatus and tendon is intact with hyperechoic change mid substance tendon indicating calcific tendinopathy. Infraspinatus tendon appears to be intact. AC joint degenerative with effusion Impression: No large full-thickness retracted rotator cuff tear.  X-ray images right shoulder obtained in January 2022 personally independently interpreted. No acute fractures.  No significant glenohumeral DJD.   Impression and Recommendations:    Assessment and Plan: 75 y.o. female with right shoulder pain.  Concerning for rotator cuff tendinopathy.  Failing conservative management.  Patient had steroid injection and home exercise program in January 2022 about 6 weeks ago.  She has failed to improve with this treatment.  We will proceed with MRI for potential surgical planning.  Also will proceed to formal physical therapy as well.  Recheck after MRI.Marland Kitchen  PDMP not reviewed this encounter. Orders Placed This Encounter  Procedures  . [place] LIMITED JOINT SPACE STRUCTURES UP LEFT(NO LINKED CHARGES)    Standing Status:   Future    Number of Occurrences:   1    Standing Expiration Date:   11/17/2020    Order Specific  Question:   Reason for Exam (SYMPTOM  OR DIAGNOSIS REQUIRED)    Answer:   chronic left shoulder pain    Order Specific Question:   Preferred  imaging location?    Answer:   [facility]  . MR SHOULDER LEFT WO CONTRAST    Standing Status:   Future    Standing Expiration Date:   05/17/2021    Order Specific Question:   What is the patient's sedation requirement?    Answer:   No Sedation    Order Specific Question:   Does the patient have a pacemaker or implanted devices?    Answer:   No    Order Specific Question:   Preferred imaging location?    Answer:   [facility] (table limit-350lbs)  . Ambulatory referral to Physical Therapy    Referral Priority:   Routine    Referral Type:   Physical Medicine    Referral Reason:   Specialty Services Required    Requested Specialty:   Physical Therapy   No orders of the defined types were placed in this encounter.   Discussed warning signs or symptoms. Please see discharge instructions. Patient expresses understanding.   The above documentation has been reviewed and is accurate and complete Sara [name], M.D.

## 2020-05-17 ENCOUNTER — Other Ambulatory Visit: Payer: Self-pay

## 2020-05-17 ENCOUNTER — Ambulatory Visit: Payer: Self-pay

## 2020-05-17 ENCOUNTER — Ambulatory Visit: Payer: Medicare HMO | Admitting: Family Medicine

## 2020-05-17 ENCOUNTER — Encounter: Payer: Self-pay | Admitting: Family Medicine

## 2020-05-17 VITALS — BP 102/76 | HR 84 | Ht 65.0 in | Wt 174.4 lb

## 2020-05-17 DIAGNOSIS — M25512 Pain in left shoulder: Secondary | ICD-10-CM

## 2020-05-17 DIAGNOSIS — G8929 Other chronic pain: Secondary | ICD-10-CM

## 2020-05-17 NOTE — Patient Instructions (Addendum)
Thank you for coming in today.  You should hear from MRI scheduling within 1 week. If you do not hear please let me know.   I've referred you to Physical Therapy.  Let us know if you don't hear from them in one week.  Recheck following MRI.

## 2020-05-18 ENCOUNTER — Ambulatory Visit: Payer: Medicare HMO | Admitting: Family Medicine

## 2020-05-18 ENCOUNTER — Ambulatory Visit: Payer: Medicare Other | Admitting: Adult Health Nurse Practitioner

## 2020-05-18 NOTE — Addendum Note (Signed)
Addended by: Pollyann Glen on: 05/18/2020 10:38 AM   Modules accepted: Orders

## 2020-05-24 ENCOUNTER — Ambulatory Visit
Admission: RE | Admit: 2020-05-24 | Discharge: 2020-05-24 | Disposition: A | Discharge status: Home / Self Care | Payer: No Typology Code available for payment source | Source: Ambulatory Visit | Attending: Internal Medicine | Admitting: Internal Medicine

## 2020-05-24 DIAGNOSIS — E785 Hyperlipidemia, unspecified: Secondary | ICD-10-CM | POA: Diagnosis not present

## 2020-05-24 DIAGNOSIS — E1121 Type 2 diabetes mellitus with diabetic nephropathy: Secondary | ICD-10-CM

## 2020-05-24 DIAGNOSIS — I700 Atherosclerosis of aorta: Secondary | ICD-10-CM | POA: Diagnosis not present

## 2020-05-24 DIAGNOSIS — I2510 Atherosclerotic heart disease of native coronary artery without angina pectoris: Secondary | ICD-10-CM | POA: Diagnosis not present

## 2020-05-31 ENCOUNTER — Ambulatory Visit: Payer: Medicare Other | Admitting: Adult Health Nurse Practitioner

## 2020-06-02 ENCOUNTER — Ambulatory Visit: Payer: Medicare HMO | Admitting: Rehabilitative and Restorative Service Providers"

## 2020-06-05 ENCOUNTER — Other Ambulatory Visit: Payer: Self-pay | Admitting: Adult Health

## 2020-06-06 DIAGNOSIS — I10 Essential (primary) hypertension: Secondary | ICD-10-CM | POA: Diagnosis not present

## 2020-06-06 DIAGNOSIS — Z862 Personal history of diseases of the blood and blood-forming organs and certain disorders involving the immune mechanism: Secondary | ICD-10-CM | POA: Diagnosis not present

## 2020-06-06 DIAGNOSIS — E1121 Type 2 diabetes mellitus with diabetic nephropathy: Secondary | ICD-10-CM | POA: Diagnosis not present

## 2020-06-06 DIAGNOSIS — M8589 Other specified disorders of bone density and structure, multiple sites: Secondary | ICD-10-CM | POA: Diagnosis not present

## 2020-06-06 DIAGNOSIS — M810 Age-related osteoporosis without current pathological fracture: Secondary | ICD-10-CM | POA: Diagnosis not present

## 2020-06-06 DIAGNOSIS — E782 Mixed hyperlipidemia: Secondary | ICD-10-CM | POA: Diagnosis not present

## 2020-06-06 DIAGNOSIS — I700 Atherosclerosis of aorta: Secondary | ICD-10-CM | POA: Diagnosis not present

## 2020-06-07 ENCOUNTER — Ambulatory Visit
Admission: RE | Admit: 2020-06-07 | Discharge: 2020-06-07 | Disposition: A | Discharge status: Home / Self Care | Payer: Medicare HMO | Source: Ambulatory Visit | Attending: Family Medicine | Admitting: Family Medicine

## 2020-06-07 ENCOUNTER — Other Ambulatory Visit: Payer: Self-pay

## 2020-06-07 DIAGNOSIS — M19012 Primary osteoarthritis, left shoulder: Secondary | ICD-10-CM | POA: Diagnosis not present

## 2020-06-07 DIAGNOSIS — S46012A Strain of muscle(s) and tendon(s) of the rotator cuff of left shoulder, initial encounter: Secondary | ICD-10-CM | POA: Diagnosis not present

## 2020-06-07 DIAGNOSIS — R531 Weakness: Secondary | ICD-10-CM | POA: Diagnosis not present

## 2020-06-07 DIAGNOSIS — G8929 Other chronic pain: Secondary | ICD-10-CM

## 2020-06-07 DIAGNOSIS — M7552 Bursitis of left shoulder: Secondary | ICD-10-CM | POA: Diagnosis not present

## 2020-06-09 NOTE — Progress Notes (Signed)
Left shoulder MRI shows a rotator cuff tear of the supraspinatus tendon.  This is the one that lifts your arm over your head.  The entire tendon is not torn which is good news but this would explain a lot of pain.  Additionally there are some small tearing of the other rotator cuff tendon in the front of the shoulder.  You also have medium arthritis in the shoulder.  We will discuss this at high detail upon your return visit on 28 March.

## 2020-06-09 NOTE — Progress Notes (Signed)
I, Wendy Poet, LAT, ATC, am serving as scribe for Dr. Lynne Leader.  Sara [name] is a 75 y.o. female who presents to [place] at [facility] today for f/u of L shoulder pain and L shoulder MRI review.  She was last seen by Dr. Georgina Snell on 05/17/20 and was referred for an MRI after failing prior conservative measures including a L subacromial injection and HEP.  She was referred to PT at [facility] but has not completed any visits.  Since her last appt w/ Dr. Georgina Snell, pt reports that her L shoulder is about the same.  She has her first PT session scheduled for this Friday, April1, 2022.  She very much wants to avoid surgery or an injection but notes that she is having a lot of pain in her shoulder.  She also has not optimistic about physical therapy.  Diagnostic testing: L shoulder MRI- 06/07/20; L shoulder and c-spine XR- 04/05/20   Pertinent review of systems: No fevers or chills  Relevant historical information: COPD, diabetes   Exam:  BP 92/62 (BP Location: Right Arm, Patient Position: Sitting, Cuff Size: Normal)   Pulse 81   Ht 5\' 5"  (1.651 m)   Wt 177 lb 12.8 oz (80.6 kg)   SpO2 97%   BMI 29.59 kg/m  General: Well Developed, well nourished, and in no acute distress.   MSK: Left shoulder normal appearing.  Decreased motion    Lab and Radiology Results   EXAM: MRI OF THE LEFT SHOULDER WITHOUT CONTRAST  TECHNIQUE: Multiplanar, multisequence MR imaging of the shoulder was performed. No intravenous contrast was administered.  COMPARISON:  X-ray 04/05/2020  FINDINGS: Rotator cuff: Moderate supraspinatus tendinosis with high-grade near full-thickness tear of the anterior to mid tendon insertion with interstitial and articular sided components (series 4, images 11-12). Tear measures approximately 9 mm in AP dimension. No full-thickness component. Low-grade interstitial tears within the subscapularis tendon. Infraspinatus and teres minor tendons  intact.  Muscles: Preserved bulk and signal intensity of the rotator cuff musculature without edema, atrophy, or fatty infiltration.  Biceps long head:  Intact.  Acromioclavicular Joint: Minimal degenerative changes at the [facility] joint. Small volume subacromial-subdeltoid bursal fluid.  Glenohumeral Joint: Moderate diffuse chondral thinning and surface irregularity. Small humeral head marginal osteophytes. No joint effusion.  Labrum: Grossly intact, but evaluation is limited by lack of intraarticular fluid.  Bones: No acute fracture. No dislocation. No bone marrow edema. No suspicious bone lesion.  Other: None.  IMPRESSION: 1. Moderate supraspinatus tendinosis with high-grade near full-thickness tearing of the anterior to mid tendon insertion. 2. Low-grade interstitial tears within the subscapularis tendon. 3. Moderate glenohumeral osteoarthritis. 4. Mild subacromial-subdeltoid bursitis.   Electronically Signed   By: Davina Poke D.O.   On: 06/08/2020 08:45  I, Lynne Leader, personally (independently) visualized and performed the interpretation of the images attached in this note.     Assessment and Plan: 75 y.o. female with left shoulder pain multifactorial.  Primary cause of pain is due to the partial rotator cuff tear in the supraspinatus tendon.  Additionally she does have moderate glenohumeral DJD.  Discussed options.  She would like to avoid steroid injections for now which I think is reasonable.  Additionally she would like to avoid surgery.  Therefore best option at this point is trial of physical therapy.  Plan for limited trial of PT and recheck in 1 month.  If not better at that point would recommend consultation with orthopedic surgery to discuss surgical  options.    Discussed warning signs or symptoms. Please see discharge instructions. Patient expresses understanding.   The above documentation has been reviewed and is accurate and complete Lynne Leader, M.D.  Total encounter time 20 minutes including face-to-face time with the patient and, reviewing past medical record, and charting on the date of service.   Reviewed MRI findings discussed treatment plan and options.

## 2020-06-12 ENCOUNTER — Encounter: Payer: Self-pay | Admitting: Family Medicine

## 2020-06-12 ENCOUNTER — Ambulatory Visit: Payer: Medicare HMO | Admitting: Family Medicine

## 2020-06-12 ENCOUNTER — Telehealth: Payer: Self-pay | Admitting: Family Medicine

## 2020-06-12 ENCOUNTER — Other Ambulatory Visit: Payer: Self-pay

## 2020-06-12 VITALS — BP 92/62 | HR 81 | Ht 65.0 in | Wt 177.8 lb

## 2020-06-12 DIAGNOSIS — M25512 Pain in left shoulder: Secondary | ICD-10-CM

## 2020-06-12 DIAGNOSIS — G8929 Other chronic pain: Secondary | ICD-10-CM | POA: Diagnosis not present

## 2020-06-12 DIAGNOSIS — M75112 Incomplete rotator cuff tear or rupture of left shoulder, not specified as traumatic: Secondary | ICD-10-CM

## 2020-06-12 NOTE — Telephone Encounter (Signed)
Patient called asking if Dr Georgina Snell would go ahead and send a referral to Dr Veverly Fells at [facility]. She is going to continue with PT but wanted to start the process of seeing the surgeon.

## 2020-06-12 NOTE — Patient Instructions (Signed)
Thank you for coming in today.  Start PT.   Let me know if this is not working.   Recheck in 1 month.

## 2020-06-13 NOTE — Telephone Encounter (Signed)
Spoke w/ pt and let her know Dr. Georgina Snell placed the referral for surgery.

## 2020-06-13 NOTE — Telephone Encounter (Signed)
Referral placed.

## 2020-06-16 ENCOUNTER — Ambulatory Visit: Payer: Medicare HMO | Admitting: Rehabilitative and Restorative Service Providers"

## 2020-06-19 DIAGNOSIS — J984 Other disorders of lung: Secondary | ICD-10-CM | POA: Diagnosis not present

## 2020-06-19 DIAGNOSIS — R0609 Other forms of dyspnea: Secondary | ICD-10-CM | POA: Diagnosis not present

## 2020-06-19 DIAGNOSIS — Z9889 Other specified postprocedural states: Secondary | ICD-10-CM | POA: Diagnosis not present

## 2020-06-19 DIAGNOSIS — D869 Sarcoidosis, unspecified: Secondary | ICD-10-CM | POA: Diagnosis not present

## 2020-06-19 DIAGNOSIS — M25569 Pain in unspecified knee: Secondary | ICD-10-CM | POA: Diagnosis not present

## 2020-06-19 DIAGNOSIS — M1990 Unspecified osteoarthritis, unspecified site: Secondary | ICD-10-CM | POA: Diagnosis not present

## 2020-06-19 DIAGNOSIS — M2550 Pain in unspecified joint: Secondary | ICD-10-CM | POA: Diagnosis not present

## 2020-06-19 DIAGNOSIS — Z862 Personal history of diseases of the blood and blood-forming organs and certain disorders involving the immune mechanism: Secondary | ICD-10-CM | POA: Diagnosis not present

## 2020-06-19 DIAGNOSIS — M25512 Pain in left shoulder: Secondary | ICD-10-CM | POA: Diagnosis not present

## 2020-06-19 DIAGNOSIS — M7582 Other shoulder lesions, left shoulder: Secondary | ICD-10-CM | POA: Diagnosis not present

## 2020-06-19 DIAGNOSIS — M4184 Other forms of scoliosis, thoracic region: Secondary | ICD-10-CM | POA: Diagnosis not present

## 2020-07-03 DIAGNOSIS — M810 Age-related osteoporosis without current pathological fracture: Secondary | ICD-10-CM | POA: Diagnosis not present

## 2020-07-12 DIAGNOSIS — M75122 Complete rotator cuff tear or rupture of left shoulder, not specified as traumatic: Secondary | ICD-10-CM | POA: Diagnosis not present

## 2020-08-04 ENCOUNTER — Ambulatory Visit: Payer: Medicare HMO | Admitting: Pulmonary Disease

## 2020-08-04 ENCOUNTER — Other Ambulatory Visit: Payer: Self-pay

## 2020-08-04 ENCOUNTER — Encounter: Payer: Self-pay | Admitting: Pulmonary Disease

## 2020-08-04 VITALS — BP 100/60 | HR 81 | Ht 65.0 in | Wt 173.0 lb

## 2020-08-04 DIAGNOSIS — J449 Chronic obstructive pulmonary disease, unspecified: Secondary | ICD-10-CM | POA: Diagnosis not present

## 2020-08-04 DIAGNOSIS — D860 Sarcoidosis of lung: Secondary | ICD-10-CM

## 2020-08-04 DIAGNOSIS — R0602 Shortness of breath: Secondary | ICD-10-CM | POA: Diagnosis not present

## 2020-08-04 DIAGNOSIS — J45909 Unspecified asthma, uncomplicated: Secondary | ICD-10-CM | POA: Diagnosis not present

## 2020-08-04 MED ORDER — ALBUTEROL SULFATE (2.5 MG/3ML) 0.083% IN NEBU: 2.5000 mg | INHALATION_SOLUTION | Freq: Four times a day (QID) | RESPIRATORY_TRACT | 12 refills | Status: DC | PRN

## 2020-08-04 NOTE — Patient Instructions (Signed)
Pulmonary sarcoidosis - Dx remotely via bronchoscopy - No indication for prednisone therapy - Annual PFTs. Schedule pulmonary function  - Recommend annual ophthalmology exam.  Last visit on 05/2020 - Will need EKG at next visit - Reviewed CBC and CMET 02/2020.  Shortness of breath - CONTINUE Albuterol as needed for shortness of breath or wheezing. ORDER today  - Schedule pulmonary function tests  Follow-up with me after PFTs

## 2020-08-04 NOTE — Progress Notes (Signed)
Subjective:   PATIENT ID: [name] GENDER: female DOB: [birth year], MRN: [id number]   HPI  Chief Complaint  Patient presents with  . Consult    Diagnosed in the past, no biopsy done, no family history    Reason for Visit: New consult for sarcoidosis  [name] is a 75 year old female never smoker with reported sarcoidosis who presents as a new patient.  She was diagnosed with sarcoidosis over 30 years ago. She was hospitalized for respiratory issues and reportedly had bronchoscopy for her diagnosis. She was treated with prednisone for 5+ years. After her hospitalizations she had asthma like symptoms for wheezing and shortness of breath and required bronchodilators. She was told she was in remission in 1990. No family history of sarcoid. She periodically uses her inhalers when triggered by activity, heat, pollen or allergens. 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   She reports her breathing is normal at baseline and does not have issues. She likes to work in her yard. She states that she purse lips breathes everyday. She has wheezing and cough with hot weather or when she is doing more strenuous activity.  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  Past Medical History:  Diagnosis Date  . Anxiety 07/23/2017  . Asthma   . Bronchitis    hx of;>71yr ago  . Chronic neck pain    arthritis  . [place]   . Degenerative disc disease    neck  . DeQuervain's disease (tenosynovitis)   . Diverticulitis   . Dizziness    related to neck issues  . Frozen shoulder September 2012  . GERD  (gastroesophageal reflux disease)    takes Ranitidine nightly  . Glaucoma   . Hay fever   . Headache(784.0)    related neck issues  . High cholesterol    takes Lipitor daily  . History of shingles 2012  . Hypertension    takes Lotensin HCT daily  . Insomnia    d/t pain and takes Ativan nightly  . Osteoporosis   . Sarcoidosis >316yrago   no problems since  . Type II or unspecified type diabetes mellitus without mention of complication, not stated as uncontrolled    takes Metformin nightly     Family History  Problem Relation Age of Onset  . Cancer Mother [id number]     breast cancer  . Pneumonia Father   . Leukemia Father   . Glaucoma Father   . Mitral valve prolapse Sister   . Hypertension Sister   . Pulmonary embolism Sister   . Diabetes Sister   . HIV Son   . Anesthesia problems Neg Hx   . Hypotension Neg Hx   . Malignant hyperthermia Neg Hx   . Pseudochol deficiency Neg Hx      Social History   Occupational History  . Not on file  Tobacco Use  . Smoking status: Never Smoker  . Smokeless tobacco: Never Used  Substance and Sexual Activity  . Alcohol use: No  . Drug use: No  . Sexual activity: Never    Birth control/protection: Surgical    Allergies  Allergen Reactions  . Penicillins Hives and Shortness Of Breath  .  Aspirin Other (See Comments)    Stomach upset, but low dose is ok  . Hydromorphone Nausea Only    Other reaction(s): Sweating (intolerance)  . Sulfa Antibiotics Other (See Comments)    unknown  . Codeine Rash     Outpatient Medications Prior to Visit  Medication Sig Dispense Refill  . Accu-Chek Softclix Lancets lancets CHECK BLOOD GLUCOSE 1 TIME EVERY DAY. Dx-R73.03 100 each 1  . acetaminophen (TYLENOL) 650 MG CR tablet Take 1,300 mg by mouth every 8 (eight) hours as needed. For pain    . albuterol (PROVENTIL) (2.5 MG/3ML) 0.083% nebulizer solution Take 3 mLs (2.5 mg total) by nebulization every 6 (six) hours as needed for wheezing or  shortness of breath. 75 mL 12  . Ascorbic Acid (VITAMIN C PO) Take 500 mg by mouth daily.     . ASPIRIN 81 PO Take by mouth daily.    . baclofen (LIORESAL) 10 MG tablet TAKE ONE-HALF TO ONE TABLET BY MOUTH 2 TO 3 TIMES DAILY ONLY IF NEEDED FOR MUSCLE SPASM ATTACK AND LIMIT TO 5 DAYS PER WEEK TO AVOID ADDICTION AND DEMENTIA 90 tablet 0  . benazepril-hydrochlorthiazide (LOTENSIN HCT) 20-25 MG tablet Take 1 tablet Daily for BP 90 tablet 3  . Blood Glucose Monitoring Suppl (ACCU-CHEK AVIVA PLUS) w/Device KIT USE AS DIRECTED  TO CHECK BLOOD SUGAR ONE TIME DAILY.DX-R73.03 1 kit 0  . brimonidine (ALPHAGAN) 0.2 % ophthalmic solution Place 1 drop into both eyes daily.    . calcium-vitamin D (OSCAL WITH D) 500-200 MG-UNIT tablet Take 1 tablet by mouth daily with breakfast.    . Cholecalciferol (VITAMIN D3) 5000 units TABS Take 10,000 Units by mouth daily.     . diclofenac Sodium (VOLTAREN) 1 % GEL Apply 2 g topically 4 (four) times daily. 100 g 0  . dorzolamide-timolol (COSOPT) 22.3-6.8 MG/ML ophthalmic solution Place 1 drop into both eyes 2 (two) times daily.    . famotidine (PEPCID) 40 MG tablet TAKE 1 TABLET BY MOUTH  DAILY TO PREVENT  INDIGESTION AND HEARTBURN 90 tablet 3  . fluticasone (FLONASE) 50 MCG/ACT nasal spray SHAKE LIQUID AND USE 2  SPRAYS IN EACH NOSTRIL  DAILY 48 g 1  . gabapentin (NEURONTIN) 600 MG tablet TAKE 1/2 TO 1 TABLET BY  MOUTH 2 TO 3 TIMES DAILY IF NEEDED FOR PAIN 270 tablet 3  . glucose blood (ACCU-CHEK AVIVA PLUS) test strip Check blood glucose 1 time daily-DX-R73.03 100 each 1  . latanoprost (XALATAN) 0.005 % ophthalmic solution Place 1 drop into both eyes at bedtime.    Marland Kitchen LORazepam (ATIVAN) 1 MG tablet Take 1/2 - 1 tablet 2 - 3 x /day ONLY if needed for Anxiety Attack &  limit to 5 days /week to avoid Addiction & Dementia 30 tablet 0  . metFORMIN (GLUCOPHAGE-XR) 500 MG 24 hr tablet Take 1 tab daily with meal for Diabetes 90 tablet 1  . montelukast (SINGULAIR) 10 MG tablet Take     1 tablet     Daily      for  Allergies 90 tablet 11  . Multiple Vitamin (MULTIVITAMIN WITH MINERALS) TABS tablet Take 1 tablet by mouth daily.    . rosuvastatin (CRESTOR) 40 MG tablet TAKE 1 TABLET(40 MG) BY MOUTH DAILY 90 tablet 2   No facility-administered medications prior to visit.    Review of Systems  Constitutional: Negative for chills, diaphoresis, fever, malaise/fatigue and weight loss.  HENT: Negative for congestion, ear pain and sore throat.   Respiratory:  Positive for shortness of breath and wheezing. Negative for cough, hemoptysis and sputum production.   Cardiovascular: Negative for chest pain, palpitations and leg swelling.  Gastrointestinal: Negative for abdominal pain, heartburn and nausea.  Genitourinary: Negative for frequency.  Musculoskeletal: Negative for joint pain and myalgias.  Skin: Negative for itching and rash.  Neurological: Negative for dizziness, weakness and headaches.  Endo/Heme/Allergies: Does not bruise/bleed easily.  Psychiatric/Behavioral: Negative for depression. The patient is not nervous/anxious.      Objective:   Vitals:   08/04/20 0956  BP: 100/60  Pulse: 81  SpO2: 99%  Weight: 173 lb (78.5 kg)  Height: 5' 5"  (1.651 m)   SpO2: 99 %  Physical Exam: General: Well-appearing, no acute distress HENT: [place], AT Eyes: EOMI, no scleral icterus Respiratory: Clear to auscultation bilaterally.  No crackles, wheezing or rales Cardiovascular: RRR, -M/R/G, no JVD Extremities:-Edema,-tenderness Neuro: AAO x4, CNII-XII grossly intact Skin: Intact, no rashes or bruising Psych: Normal mood, normal affect  Data Reviewed:  Imaging: CT Cardiac 05/24/20 - No mediastinal and adenopathy seen, minimal peribronchial wall thickening, minimal central bronchiectasis, small pericardial effusion  PFT: None on file  Labs: CBC    Component Value Date/Time   WBC 3.9 03/02/2020 1136   RBC 4.42 03/02/2020 1136   HGB 13.2 03/02/2020 1136   HCT 39.7  03/02/2020 1136   PLT 228 03/02/2020 1136   MCV 89.8 03/02/2020 1136   MCH 29.9 03/02/2020 1136   MCHC 33.2 03/02/2020 1136   RDW 12.6 03/02/2020 1136   LYMPHSABS 1,474 03/02/2020 1136   MONOABS 418 05/21/2016 1029   EOSABS 70 03/02/2020 1136   BASOSABS 20 03/02/2020 1136   Absolute eos 03/02/20 - 70  BMET    Component Value Date/Time   NA 139 03/02/2020 1136   K 4.1 03/02/2020 1136   CL 104 03/02/2020 1136   CO2 29 03/02/2020 1136   GLUCOSE 125 (H) 03/02/2020 1136   BUN 11 03/02/2020 1136   CREATININE 0.82 03/02/2020 1136   CALCIUM 10.1 03/02/2020 1136   GFRNONAA 71 03/02/2020 1136   GFRAA 83 03/02/2020 1136   Hepatic Function Latest Ref Rng & Units 03/02/2020 12/01/2019 08/17/2019  Total Protein 6.1 - 8.1 g/dL 6.6 6.9 6.8  Albumin 3.6 - 5.1 g/dL - - -  AST 10 - 35 U/L 18 24 17   ALT 6 - 29 U/L 16 30(H) 19  Alk Phosphatase 33 - 130 U/L - - -  Total Bilirubin 0.2 - 1.2 mg/dL 0.6 0.6 0.5  Bilirubin, Direct 0.0 - 0.2 mg/dL - - -   Imaging, labs and test noted above have been reviewed independently by me.    Assessment & Plan:   Discussion: 75 year old female with remote history of pulmonary sarcoid who presents with shortness of breath and wheezing. Shortness of breath and wheezing may be related to underlying obstructive disease in setting of pulmonary sarcoid. She has had previous occupational exposures that likely contributed to her current symptoms. Will obtain PFTs to determine benefit in maintenance inhaler.  Pulmonary sarcoidosis - Dx remotely via bronchoscopy - No indication for prednisone therapy - Annual PFTs. Schedule pulmonary function  - Recommend annual ophthalmology exam.  Last visit on 05/2020 - Will need EKG at next visit - Reviewed CBC and CMET 02/2020.  Shortness of breath CONTINUE Albuterol as needed for shortness of breath or wheezing. ORDER today   Health Maintenance Immunization History  Administered Date(s) Administered  . Influenza, High Dose  Seasonal PF 01/04/2015, 12/31/2016, 12/05/2017,  02/10/2019  . Influenza,inj,quad, With Preservative 02/16/2020  . Influenza-Unspecified 03/01/2016  . PFIZER(Purple Top)SARS-COV-2 Vaccination 10/14/2019, 11/17/2019  . Pneumococcal Conjugate-13 11/16/2013  . Pneumococcal Polysaccharide-23 04/12/2015  . Pneumococcal-Unspecified 03/18/2005  . Tdap 03/18/2009  . Zoster 11/13/2012   CT Lung Screen - not indicated  Orders Placed This Encounter  Procedures  . Pulmonary Function Test    Standing Status:   Future    Standing Expiration Date:   08/04/2021    Scheduling Instructions:     1 hr with follow up appointment with Ayn Domangue    Order Specific Question:   Where should this test be performed?    Answer:   [facility]    Order Specific Question:   Full PFT: includes the following: basic spirometry, spirometry pre & post bronchodilator, diffusion capacity (DLCO), lung volumes    Answer:   Full PFT   Meds ordered this encounter  Medications  . albuterol (PROVENTIL) (2.5 MG/3ML) 0.083% nebulizer solution    Sig: Take 3 mLs (2.5 mg total) by nebulization every 6 (six) hours as needed for wheezing or shortness of breath.    Dispense:  252 each    Refill:  12    No follow-ups on file. Follow-up after PFTs  I have spent a total time of 45-minutes on the day of the appointment reviewing prior documentation, coordinating care and discussing medical diagnosis and plan with the patient/family. Imaging, labs and tests included in this note have been reviewed and interpreted independently by me.  [place], MD [facility] 08/04/2020 10:04 AM  Office Number [phone]

## 2020-08-10 DIAGNOSIS — H401133 Primary open-angle glaucoma, bilateral, severe stage: Secondary | ICD-10-CM | POA: Diagnosis not present

## 2020-08-12 DIAGNOSIS — R0602 Shortness of breath: Secondary | ICD-10-CM | POA: Insufficient documentation

## 2020-08-12 DIAGNOSIS — D860 Sarcoidosis of lung: Secondary | ICD-10-CM | POA: Insufficient documentation

## 2020-08-17 ENCOUNTER — Encounter: Payer: Medicare Other | Admitting: Internal Medicine

## 2020-09-08 ENCOUNTER — Other Ambulatory Visit (HOSPITAL_COMMUNITY)
Admission: RE | Admit: 2020-09-08 | Discharge: 2020-09-08 | Disposition: A | Discharge status: Home / Self Care | Payer: Medicare HMO | Source: Ambulatory Visit | Attending: Pulmonary Disease | Admitting: Pulmonary Disease

## 2020-09-08 DIAGNOSIS — Z01812 Encounter for preprocedural laboratory examination: Secondary | ICD-10-CM | POA: Insufficient documentation

## 2020-09-08 DIAGNOSIS — Z20822 Contact with and (suspected) exposure to covid-19: Secondary | ICD-10-CM | POA: Diagnosis not present

## 2020-09-08 LAB — SARS CORONAVIRUS 2 (TAT 6-24 HRS): SARS Coronavirus 2: NEGATIVE

## 2020-09-12 ENCOUNTER — Encounter: Payer: Medicare Other | Admitting: Internal Medicine

## 2020-09-12 ENCOUNTER — Ambulatory Visit (INDEPENDENT_AMBULATORY_CARE_PROVIDER_SITE_OTHER): Payer: Medicare HMO | Admitting: Pulmonary Disease

## 2020-09-12 ENCOUNTER — Encounter: Payer: Self-pay | Admitting: Pulmonary Disease

## 2020-09-12 ENCOUNTER — Other Ambulatory Visit: Payer: Self-pay

## 2020-09-12 VITALS — BP 116/62 | HR 70 | Ht 65.0 in | Wt 175.0 lb

## 2020-09-12 DIAGNOSIS — D860 Sarcoidosis of lung: Secondary | ICD-10-CM

## 2020-09-12 DIAGNOSIS — R0602 Shortness of breath: Secondary | ICD-10-CM

## 2020-09-12 DIAGNOSIS — J449 Chronic obstructive pulmonary disease, unspecified: Secondary | ICD-10-CM

## 2020-09-12 DIAGNOSIS — J45909 Unspecified asthma, uncomplicated: Secondary | ICD-10-CM

## 2020-09-12 LAB — PULMONARY FUNCTION TEST
DL/VA % pred: 120 %
DL/VA: 4.94 ml/min/mmHg/L
DLCO cor % pred: 70 %
DLCO cor: 14.18 ml/min/mmHg
DLCO unc % pred: 70 %
DLCO unc: 14.18 ml/min/mmHg
FEF 25-75 Post: 1.14 L/sec
FEF 25-75 Pre: 0.9 L/sec
FEF2575-%Change-Post: 26 %
FEF2575-%Pred-Post: 67 %
FEF2575-%Pred-Pre: 53 %
FEV1-%Change-Post: 5 %
FEV1-%Pred-Post: 71 %
FEV1-%Pred-Pre: 67 %
FEV1-Post: 1.34 L
FEV1-Pre: 1.26 L
FEV1FVC-%Change-Post: 5 %
FEV1FVC-%Pred-Pre: 97 %
FEV6-%Change-Post: 0 %
FEV6-%Pred-Post: 72 %
FEV6-%Pred-Pre: 72 %
FEV6-Post: 1.69 L
FEV6-Pre: 1.69 L
FEV6FVC-%Pred-Post: 104 %
FEV6FVC-%Pred-Pre: 104 %
FVC-%Change-Post: 0 %
FVC-%Pred-Post: 69 %
FVC-%Pred-Pre: 69 %
FVC-Post: 1.69 L
FVC-Pre: 1.69 L
Post FEV1/FVC ratio: 79 %
Post FEV6/FVC ratio: 100 %
Pre FEV1/FVC ratio: 75 %
Pre FEV6/FVC Ratio: 100 %
RV % pred: 93 %
RV: 2.14 L
TLC % pred: 75 %
TLC: 3.93 L

## 2020-09-12 MED ORDER — ALBUTEROL SULFATE HFA 108 (90 BASE) MCG/ACT IN AERS: 2.0000 | INHALATION_SPRAY | Freq: Four times a day (QID) | RESPIRATORY_TRACT | 6 refills | Status: DC | PRN

## 2020-09-12 NOTE — Patient Instructions (Signed)
Full PFT performed today. °

## 2020-09-12 NOTE — Progress Notes (Signed)
Subjective:   PATIENT ID: Sara [name] GENDER: female DOB: [birth year], MRN: [id number]   HPI  Chief Complaint  Patient presents with   Shortness of Breath    PFT today    Reason for Visit: Follow-up sarcoidosis  Sara [name] is a 75 year old female never smoker with reported sarcoidosis who presents for follow-up  Synopsis: She was diagnosed with sarcoidosis over 30 years ago. She was hospitalized for respiratory issues and reportedly had bronchoscopy for her diagnosis. She was treated with prednisone for 5+ years. After her hospitalizations she had asthma like symptoms for wheezing and shortness of breath and required bronchodilators. She was told she was in remission in 1990. No family history of sarcoid. She periodically uses her inhalers when triggered by activity, heat, pollen or allergens. 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   Since our last visit she is trying to more active and participating to senior work out class. At baseline her does not have any breathing issues. Denies shortness of breath, cough or wheezing. 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  Past Medical History:  Diagnosis Date   Anxiety 07/23/2017   Asthma    Bronchitis    hx of;>62yr ago   Chronic neck pain    arthritis   COPD (chronic obstructive pulmonary disease) (H[facility])    Degenerative disc disease    neck   DeQuervain's disease (tenosynovitis)    Diverticulitis    Dizziness    related to neck issues   Frozen shoulder September 2012   GERD (gastroesophageal reflux disease)    takes  Ranitidine nightly   Glaucoma    Hay fever    Headache(784.0)    related neck issues   High cholesterol    takes Lipitor daily   History of shingles 2012   Hypertension    takes Lotensin HCT daily   Insomnia    d/t pain and takes Ativan nightly   Osteoporosis    Sarcoidosis >364yrago   no problems since   Type II or unspecified type diabetes mellitus without mention of complication, not stated as uncontrolled    takes Metformin nightly    Allergies  Allergen Reactions   Penicillins Hives and Shortness Of Breath   Aspirin Other (See Comments)    Stomach upset, but low dose is ok   Hydromorphone Nausea Only    Other reaction(s): Sweating (intolerance)   Sulfa Antibiotics Other (See Comments)    unknown   Codeine Rash     Outpatient Medications Prior to Visit  Medication Sig Dispense Refill   Accu-Chek Softclix Lancets lancets CHECK BLOOD GLUCOSE 1 TIME EVERY DAY. Dx-R73.03 100 each 1   acetaminophen (TYLENOL) 650 MG CR tablet Take 1,300 mg by mouth every 8 (eight) hours as needed. For pain     albuterol (PROVENTIL) (2.5 MG/3ML) 0.083% nebulizer solution Take 3 mLs (2.5 mg total) by nebulization every 6 (six) hours as needed for wheezing or shortness of breath. 252 each 12   Ascorbic Acid (VITAMIN C PO) Take 500 mg by mouth daily.      ASPIRIN 81 PO Take by mouth daily.     baclofen (LIORESAL) 10 MG tablet  TAKE ONE-HALF TO ONE TABLET BY MOUTH 2 TO 3 TIMES DAILY ONLY IF NEEDED FOR MUSCLE SPASM ATTACK AND LIMIT TO 5 DAYS PER WEEK TO AVOID ADDICTION AND DEMENTIA 90 tablet 0   benazepril-hydrochlorthiazide (LOTENSIN HCT) 20-25 MG tablet Take 1 tablet Daily for BP 90 tablet 3   Blood Glucose Monitoring Suppl (ACCU-CHEK AVIVA PLUS) w/Device KIT USE AS DIRECTED  TO CHECK BLOOD SUGAR ONE TIME DAILY.DX-R73.03 1 kit 0   brimonidine (ALPHAGAN) 0.2 % ophthalmic solution Place 1 drop into both eyes daily.     calcium-vitamin D (OSCAL WITH D) 500-200 MG-UNIT tablet Take 1 tablet by mouth  daily with breakfast.     Cholecalciferol (VITAMIN D3) 5000 units TABS Take 10,000 Units by mouth daily.      diclofenac Sodium (VOLTAREN) 1 % GEL Apply 2 g topically 4 (four) times daily. 100 g 0   dorzolamide-timolol (COSOPT) 22.3-6.8 MG/ML ophthalmic solution Place 1 drop into both eyes 2 (two) times daily.     famotidine (PEPCID) 40 MG tablet TAKE 1 TABLET BY MOUTH  DAILY TO PREVENT  INDIGESTION AND HEARTBURN 90 tablet 3   fluticasone (FLONASE) 50 MCG/ACT nasal spray SHAKE LIQUID AND USE 2  SPRAYS IN EACH NOSTRIL  DAILY 48 g 1   fluticasone furoate-vilanterol (BREO ELLIPTA) 100-25 MCG/INH AEPB Inhale 1 puff into the lungs. Patient was only taking as needed     gabapentin (NEURONTIN) 600 MG tablet TAKE 1/2 TO 1 TABLET BY  MOUTH 2 TO 3 TIMES DAILY IF NEEDED FOR PAIN 270 tablet 3   glucose blood (ACCU-CHEK AVIVA PLUS) test strip Check blood glucose 1 time daily-DX-R73.03 100 each 1   latanoprost (XALATAN) 0.005 % ophthalmic solution Place 1 drop into both eyes at bedtime.     LORazepam (ATIVAN) 1 MG tablet Take 1/2 - 1 tablet 2 - 3 x /day ONLY if needed for Anxiety Attack &  limit to 5 days /week to avoid Addiction & Dementia 30 tablet 0   Menaquinone-7 (VITAMIN K2 PO) Take by mouth daily.     metFORMIN (GLUCOPHAGE-XR) 500 MG 24 hr tablet Take 1 tab daily with meal for Diabetes 90 tablet 1   montelukast (SINGULAIR) 10 MG tablet Take    1 tablet     Daily      for  Allergies 90 tablet 11   Multiple Vitamin (MULTIVITAMIN WITH MINERALS) TABS tablet Take 1 tablet by mouth daily.     OVER THE COUNTER MEDICATION daily at 6 (six) AM. B berry extract for eyes     rosuvastatin (CRESTOR) 40 MG tablet TAKE 1 TABLET(40 MG) BY MOUTH DAILY 90 tablet 2   No facility-administered medications prior to visit.    Review of Systems  Constitutional:  Negative for chills, diaphoresis, fever, malaise/fatigue and weight loss.  HENT:  Negative for congestion.   Respiratory:  Positive for cough and wheezing. Negative  for hemoptysis, sputum production and shortness of breath.   Cardiovascular:  Negative for chest pain, palpitations and leg swelling.    Objective:   Vitals:   09/12/20 1039  BP: 116/62  Pulse: 70  SpO2: 98%  Weight: 175 lb (79.4 kg)  Height: 5' 5" (1.651 m)   SpO2: 98 %  Physical Exam: General: Well-appearing, no acute distress HENT: [place], AT Eyes: EOMI, no scleral icterus Respiratory: Clear to auscultation bilaterally.  No crackles, wheezing or rales Cardiovascular: RRR, -M/R/G, no JVD Extremities:-Edema,-tenderness Neuro: AAO x4, CNII-XII grossly intact Skin: Intact, no rashes or bruising Psych: Normal   mood, normal affect  Data Reviewed:  Imaging: CT Cardiac 05/24/20 - No mediastinal and adenopathy seen, minimal peribronchial wall thickening, minimal central bronchiectasis, small pericardial effusion  PFT: 09/12/20 FVC 1.69 (68%) FEV1 1.34 (71%) Ratio 75  TLC 75% DLCO 70% Interpretation: Mild restrictive lung disease with mild reduction in gas exchange. No significant bronchodilator response   Labs: CBC    Component Value Date/Time   WBC 3.9 03/02/2020 1136   RBC 4.42 03/02/2020 1136   HGB 13.2 03/02/2020 1136   HCT 39.7 03/02/2020 1136   PLT 228 03/02/2020 1136   MCV 89.8 03/02/2020 1136   MCH 29.9 03/02/2020 1136   MCHC 33.2 03/02/2020 1136   RDW 12.6 03/02/2020 1136   LYMPHSABS 1,474 03/02/2020 1136   MONOABS 418 05/21/2016 1029   EOSABS 70 03/02/2020 1136   BASOSABS 20 03/02/2020 1136   Absolute eos 03/02/20 - 70  BMET    Component Value Date/Time   NA 139 03/02/2020 1136   K 4.1 03/02/2020 1136   CL 104 03/02/2020 1136   CO2 29 03/02/2020 1136   GLUCOSE 125 (H) 03/02/2020 1136   BUN 11 03/02/2020 1136   CREATININE 0.82 03/02/2020 1136   CALCIUM 10.1 03/02/2020 1136   GFRNONAA 71 03/02/2020 1136   GFRAA 83 03/02/2020 1136   Hepatic Function Latest Ref Rng & Units 03/02/2020 12/01/2019 08/17/2019  Total Protein 6.1 - 8.1 g/dL 6.6 6.9 6.8   Albumin 3.6 - 5.1 g/dL - - -  AST 10 - 35 U/L 18 24 17  ALT 6 - 29 U/L 16 30(H) 19  Alk Phosphatase 33 - 130 U/L - - -  Total Bilirubin 0.2 - 1.2 mg/dL 0.6 0.6 0.5  Bilirubin, Direct 0.0 - 0.2 mg/dL - - -   Imaging, labs and test noted above have been reviewed independently by me.     Assessment & Plan:   Discussion: 74 year old female with remote history of pulmonary sarcoid who presents with shortness of breath and wheezing. Shortness of breath and wheezing may be related to underlying obstructive disease in setting of pulmonary sarcoid. She has had previous occupational exposures that likely contributed to her current symptoms. Will obtain PFTs to determine benefit in maintenance inhaler.  Discussed clinical course. Reviewed PFTs which demonstrated mild restrictive defect.  Pulmonary sarcoidosis with mild restrictive defect - Dx remotely via bronchoscopy - No indication for prednisone therapy - Annual PFTs. Last completed 08/2020. Next due 08/2022 - Recommend annual ophthalmology exam.  Last visit on 05/2020 - Baseline EKG obtained. - Reviewed CBC and CMET 02/2020.  Shortness of breath  --Take ALBUTEROL as needed for shortness of breath or wheezing. Ok to take 20 minutes before activity --Hold on taking Breo however if you wish to start in the future, please make an appointment to discuss --EKG today. NSR with no ST changes or TWI. Prolonged PR interval  Health Maintenance Immunization History  Administered Date(s) Administered   Influenza, High Dose Seasonal PF 01/04/2015, 12/31/2016, 12/05/2017, 02/10/2019   Influenza,inj,quad, With Preservative 02/16/2020   Influenza-Unspecified 03/01/2016   PFIZER(Purple Top)SARS-COV-2 Vaccination 10/14/2019, 11/17/2019   Pneumococcal Conjugate-13 11/16/2013   Pneumococcal Polysaccharide-23 04/12/2015   Pneumococcal-Unspecified 03/18/2005   Tdap 03/18/2009   Zoster, Live 11/13/2012   CT Lung Screen - not indicated  Orders Placed  This Encounter  Procedures   EKG 12-Lead   Meds ordered this encounter  Medications   albuterol (VENTOLIN HFA) 108 (90 Base) MCG/ACT inhaler    Sig: Inhale 2 puffs into   the lungs every 6 (six) hours as needed for wheezing or shortness of breath.    Dispense:  8 g    Refill:  6    Return in about 6 months (around 03/14/2021).   I have spent a total time of 36-minutes on the day of the appointment reviewing prior documentation, coordinating care and discussing medical diagnosis and plan with the patient/family. Imaging, labs and tests included in this note have been reviewed and interpreted independently by me.  Chi Jane Ellison, MD [facility] 09/12/2020 11:00 AM  Office Number [phone]   

## 2020-09-12 NOTE — Patient Instructions (Addendum)
--  Take ALBUTEROL as needed for shortness of breath or wheezing. Ok to take 20 minutes before activity --Hold on taking Breo however if you wish to start in the future, please make an appointment to discuss --EKG today  Follow-up with me in 6 months

## 2020-09-12 NOTE — Progress Notes (Signed)
Full PFT performed today. °

## 2020-09-19 DIAGNOSIS — M549 Dorsalgia, unspecified: Secondary | ICD-10-CM | POA: Diagnosis not present

## 2020-09-19 DIAGNOSIS — M1990 Unspecified osteoarthritis, unspecified site: Secondary | ICD-10-CM | POA: Diagnosis not present

## 2020-09-19 DIAGNOSIS — M2550 Pain in unspecified joint: Secondary | ICD-10-CM | POA: Diagnosis not present

## 2020-09-19 DIAGNOSIS — R0609 Other forms of dyspnea: Secondary | ICD-10-CM | POA: Diagnosis not present

## 2020-09-19 DIAGNOSIS — M47816 Spondylosis without myelopathy or radiculopathy, lumbar region: Secondary | ICD-10-CM | POA: Diagnosis not present

## 2020-09-19 DIAGNOSIS — Z862 Personal history of diseases of the blood and blood-forming organs and certain disorders involving the immune mechanism: Secondary | ICD-10-CM | POA: Diagnosis not present

## 2020-09-21 ENCOUNTER — Telehealth: Payer: Self-pay | Admitting: Pulmonary Disease

## 2020-09-21 NOTE — Telephone Encounter (Signed)
Call returned to patient, confirmed DOB. Patient states she thought her inhaler did not have a co-pay. She contacted her insurance and they will be calling her back with an alternative inhaler. Patient states they also may be asking for more information regarding the inhaler so be on the lookout for any information. Made the patient aware to be sure to call us back regarding the cheaper alternative. Voiced understanding.   Nothing further needed at this time.

## 2020-09-28 DIAGNOSIS — M160 Bilateral primary osteoarthritis of hip: Secondary | ICD-10-CM | POA: Diagnosis not present

## 2020-09-28 DIAGNOSIS — M549 Dorsalgia, unspecified: Secondary | ICD-10-CM | POA: Diagnosis not present

## 2020-10-10 ENCOUNTER — Telehealth: Payer: Self-pay | Admitting: Pulmonary Disease

## 2020-10-10 NOTE — Telephone Encounter (Signed)
Ter exception information submitted and patient informed.  [id number].

## 2020-10-10 NOTE — Telephone Encounter (Signed)
Pt was prescribed Albuterol inhaler, expected to not have a copay. A charge was applied to the medication. Pt called [facility]([employer]) and they said they could get info from MD to possibly lower the price of medication. Please advise [phone] Pt states the pharmacy gave her a number to have our office call Clinical Review Number for [phone] Needing to know why pt needs inhaler

## 2020-10-10 NOTE — Telephone Encounter (Signed)
(  Key: [id number])Need help? Call us at [phone] Status Sent to [employer] The plan will fax you a determination, typically within 1 to 5 business days.  How do I follow up? Drug Albuterol Sulfate HFA 108 (90 Base)MCG/ACT aerosol Form [employer] Medicare Tier Exceptions Form [employer] Prior Authorization Form for Tier Exceptions for Medicare Members ONLY [phone] [phone]

## 2020-11-23 DIAGNOSIS — M47816 Spondylosis without myelopathy or radiculopathy, lumbar region: Secondary | ICD-10-CM | POA: Diagnosis not present

## 2020-11-27 DIAGNOSIS — H401133 Primary open-angle glaucoma, bilateral, severe stage: Secondary | ICD-10-CM | POA: Diagnosis not present

## 2020-12-05 DIAGNOSIS — E1121 Type 2 diabetes mellitus with diabetic nephropathy: Secondary | ICD-10-CM | POA: Diagnosis not present

## 2020-12-05 DIAGNOSIS — I10 Essential (primary) hypertension: Secondary | ICD-10-CM | POA: Diagnosis not present

## 2020-12-05 DIAGNOSIS — E782 Mixed hyperlipidemia: Secondary | ICD-10-CM | POA: Diagnosis not present

## 2020-12-05 DIAGNOSIS — M810 Age-related osteoporosis without current pathological fracture: Secondary | ICD-10-CM | POA: Diagnosis not present

## 2020-12-07 DIAGNOSIS — M810 Age-related osteoporosis without current pathological fracture: Secondary | ICD-10-CM | POA: Diagnosis not present

## 2020-12-07 DIAGNOSIS — Z862 Personal history of diseases of the blood and blood-forming organs and certain disorders involving the immune mechanism: Secondary | ICD-10-CM | POA: Diagnosis not present

## 2020-12-07 DIAGNOSIS — E782 Mixed hyperlipidemia: Secondary | ICD-10-CM | POA: Diagnosis not present

## 2020-12-07 DIAGNOSIS — I700 Atherosclerosis of aorta: Secondary | ICD-10-CM | POA: Diagnosis not present

## 2020-12-07 DIAGNOSIS — I10 Essential (primary) hypertension: Secondary | ICD-10-CM | POA: Diagnosis not present

## 2020-12-07 DIAGNOSIS — E1121 Type 2 diabetes mellitus with diabetic nephropathy: Secondary | ICD-10-CM | POA: Diagnosis not present

## 2020-12-15 DIAGNOSIS — I10 Essential (primary) hypertension: Secondary | ICD-10-CM | POA: Diagnosis not present

## 2020-12-15 DIAGNOSIS — E782 Mixed hyperlipidemia: Secondary | ICD-10-CM | POA: Diagnosis not present

## 2020-12-15 DIAGNOSIS — M810 Age-related osteoporosis without current pathological fracture: Secondary | ICD-10-CM | POA: Diagnosis not present

## 2020-12-15 DIAGNOSIS — E1121 Type 2 diabetes mellitus with diabetic nephropathy: Secondary | ICD-10-CM | POA: Diagnosis not present

## 2020-12-26 DIAGNOSIS — M545 Low back pain, unspecified: Secondary | ICD-10-CM | POA: Diagnosis not present

## 2020-12-26 DIAGNOSIS — M5416 Radiculopathy, lumbar region: Secondary | ICD-10-CM | POA: Diagnosis not present

## 2021-01-03 DIAGNOSIS — Z23 Encounter for immunization: Secondary | ICD-10-CM | POA: Diagnosis not present

## 2021-01-03 DIAGNOSIS — M810 Age-related osteoporosis without current pathological fracture: Secondary | ICD-10-CM | POA: Diagnosis not present

## 2021-01-03 DIAGNOSIS — M5416 Radiculopathy, lumbar region: Secondary | ICD-10-CM | POA: Diagnosis not present

## 2021-01-03 DIAGNOSIS — M545 Low back pain, unspecified: Secondary | ICD-10-CM | POA: Diagnosis not present

## 2021-01-08 DIAGNOSIS — M545 Low back pain, unspecified: Secondary | ICD-10-CM | POA: Diagnosis not present

## 2021-01-08 DIAGNOSIS — M5416 Radiculopathy, lumbar region: Secondary | ICD-10-CM | POA: Diagnosis not present

## 2021-01-11 DIAGNOSIS — M5416 Radiculopathy, lumbar region: Secondary | ICD-10-CM | POA: Diagnosis not present

## 2021-01-11 DIAGNOSIS — M545 Low back pain, unspecified: Secondary | ICD-10-CM | POA: Diagnosis not present

## 2021-01-15 DIAGNOSIS — M5416 Radiculopathy, lumbar region: Secondary | ICD-10-CM | POA: Diagnosis not present

## 2021-01-15 DIAGNOSIS — M545 Low back pain, unspecified: Secondary | ICD-10-CM | POA: Diagnosis not present

## 2021-01-15 DIAGNOSIS — I5032 Chronic diastolic (congestive) heart failure: Secondary | ICD-10-CM | POA: Diagnosis not present

## 2021-01-15 DIAGNOSIS — E785 Hyperlipidemia, unspecified: Secondary | ICD-10-CM | POA: Diagnosis not present

## 2021-01-15 DIAGNOSIS — I130 Hypertensive heart and chronic kidney disease with heart failure and stage 1 through stage 4 chronic kidney disease, or unspecified chronic kidney disease: Secondary | ICD-10-CM | POA: Diagnosis not present

## 2021-01-15 DIAGNOSIS — N183 Chronic kidney disease, stage 3 unspecified: Secondary | ICD-10-CM | POA: Diagnosis not present

## 2021-01-18 DIAGNOSIS — M545 Low back pain, unspecified: Secondary | ICD-10-CM | POA: Diagnosis not present

## 2021-01-18 DIAGNOSIS — M5416 Radiculopathy, lumbar region: Secondary | ICD-10-CM | POA: Diagnosis not present

## 2021-01-23 DIAGNOSIS — M419 Scoliosis, unspecified: Secondary | ICD-10-CM | POA: Diagnosis not present

## 2021-01-23 DIAGNOSIS — M48061 Spinal stenosis, lumbar region without neurogenic claudication: Secondary | ICD-10-CM | POA: Diagnosis not present

## 2021-02-14 DIAGNOSIS — M810 Age-related osteoporosis without current pathological fracture: Secondary | ICD-10-CM | POA: Diagnosis not present

## 2021-02-14 DIAGNOSIS — I10 Essential (primary) hypertension: Secondary | ICD-10-CM | POA: Diagnosis not present

## 2021-02-14 DIAGNOSIS — E1121 Type 2 diabetes mellitus with diabetic nephropathy: Secondary | ICD-10-CM | POA: Diagnosis not present

## 2021-02-14 DIAGNOSIS — E782 Mixed hyperlipidemia: Secondary | ICD-10-CM | POA: Diagnosis not present

## 2021-03-08 DIAGNOSIS — I10 Essential (primary) hypertension: Secondary | ICD-10-CM | POA: Diagnosis not present

## 2021-03-08 DIAGNOSIS — E1121 Type 2 diabetes mellitus with diabetic nephropathy: Secondary | ICD-10-CM | POA: Diagnosis not present

## 2021-03-08 DIAGNOSIS — E782 Mixed hyperlipidemia: Secondary | ICD-10-CM | POA: Diagnosis not present

## 2021-03-08 DIAGNOSIS — Z862 Personal history of diseases of the blood and blood-forming organs and certain disorders involving the immune mechanism: Secondary | ICD-10-CM | POA: Diagnosis not present

## 2021-03-08 DIAGNOSIS — M810 Age-related osteoporosis without current pathological fracture: Secondary | ICD-10-CM | POA: Diagnosis not present

## 2021-03-08 DIAGNOSIS — I700 Atherosclerosis of aorta: Secondary | ICD-10-CM | POA: Diagnosis not present

## 2021-03-15 DIAGNOSIS — Z1231 Encounter for screening mammogram for malignant neoplasm of breast: Secondary | ICD-10-CM | POA: Diagnosis not present

## 2021-03-16 DIAGNOSIS — E782 Mixed hyperlipidemia: Secondary | ICD-10-CM | POA: Diagnosis not present

## 2021-03-16 DIAGNOSIS — E1121 Type 2 diabetes mellitus with diabetic nephropathy: Secondary | ICD-10-CM | POA: Diagnosis not present

## 2021-03-16 DIAGNOSIS — M810 Age-related osteoporosis without current pathological fracture: Secondary | ICD-10-CM | POA: Diagnosis not present

## 2021-03-16 DIAGNOSIS — I10 Essential (primary) hypertension: Secondary | ICD-10-CM | POA: Diagnosis not present

## 2021-03-23 DIAGNOSIS — M1990 Unspecified osteoarthritis, unspecified site: Secondary | ICD-10-CM | POA: Diagnosis not present

## 2021-03-23 DIAGNOSIS — M549 Dorsalgia, unspecified: Secondary | ICD-10-CM | POA: Diagnosis not present

## 2021-03-23 DIAGNOSIS — Z862 Personal history of diseases of the blood and blood-forming organs and certain disorders involving the immune mechanism: Secondary | ICD-10-CM | POA: Diagnosis not present

## 2021-03-23 DIAGNOSIS — M2550 Pain in unspecified joint: Secondary | ICD-10-CM | POA: Diagnosis not present

## 2021-04-12 DIAGNOSIS — H401133 Primary open-angle glaucoma, bilateral, severe stage: Secondary | ICD-10-CM | POA: Diagnosis not present

## 2021-04-17 DIAGNOSIS — I10 Essential (primary) hypertension: Secondary | ICD-10-CM | POA: Diagnosis not present

## 2021-04-17 DIAGNOSIS — E1121 Type 2 diabetes mellitus with diabetic nephropathy: Secondary | ICD-10-CM | POA: Diagnosis not present

## 2021-04-17 DIAGNOSIS — M810 Age-related osteoporosis without current pathological fracture: Secondary | ICD-10-CM | POA: Diagnosis not present

## 2021-04-17 DIAGNOSIS — E782 Mixed hyperlipidemia: Secondary | ICD-10-CM | POA: Diagnosis not present

## 2021-04-20 DIAGNOSIS — E119 Type 2 diabetes mellitus without complications: Secondary | ICD-10-CM | POA: Diagnosis not present

## 2021-05-09 IMAGING — MR MR SHOULDER*L* W/O CM
5 series · 40 of 40 positions shown · non-contrast
Comparison: X-ray 04/05/2020

CLINICAL DATA: Left shoulder pain and weakness 1 year

EXAM:
MRI OF THE LEFT SHOULDER WITHOUT CONTRAST
TECHNIQUE: Multiplanar, multisequence MR imaging of the shoulder was performed.
No intravenous contrast was administered.

[Series 3: T2 fat-sat · axial · 4.0mm · 0.27mm/px · z∈[-54,+91]mm · 10 of 30 slices shown (1 of 3)]
[im 1/30]
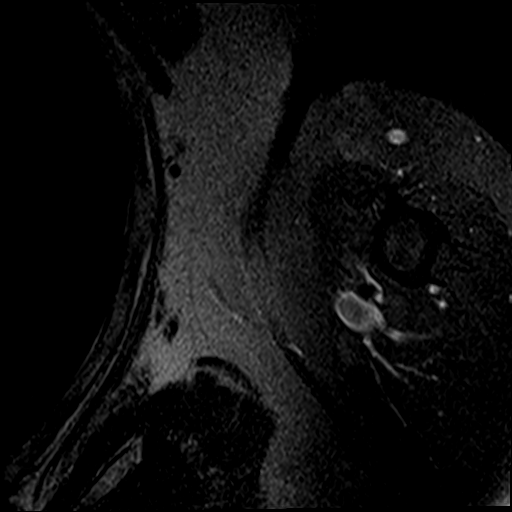
[im 4/30]
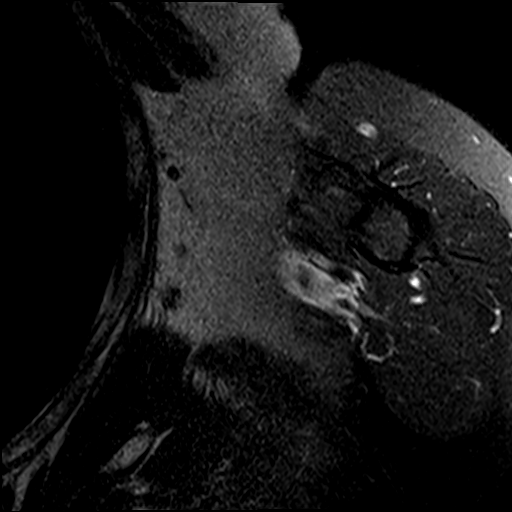
[im 7/30]
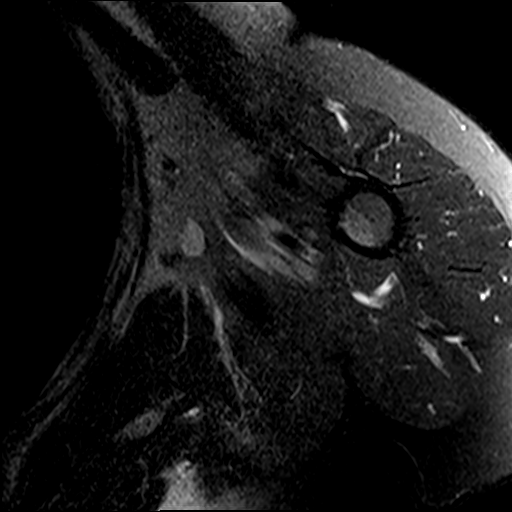
[im 10/30]
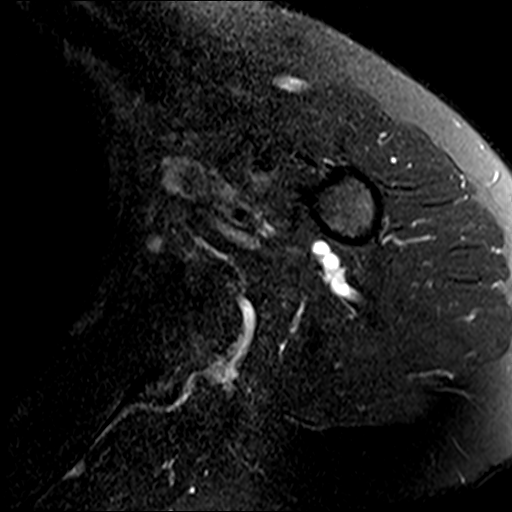
[im 13/30]
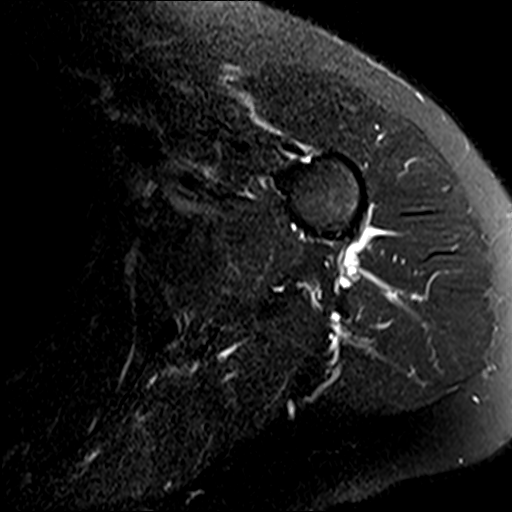
[im 17/30]
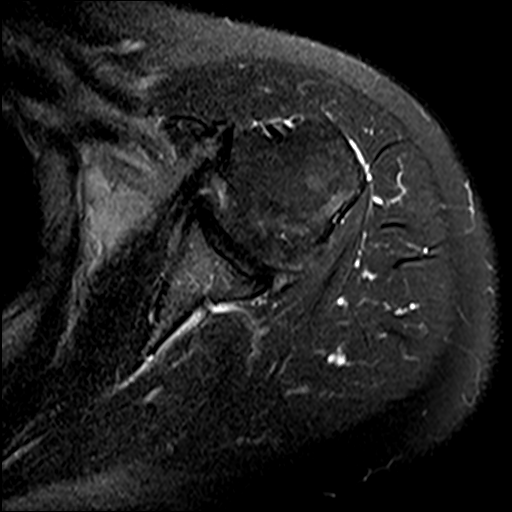
[im 20/30]
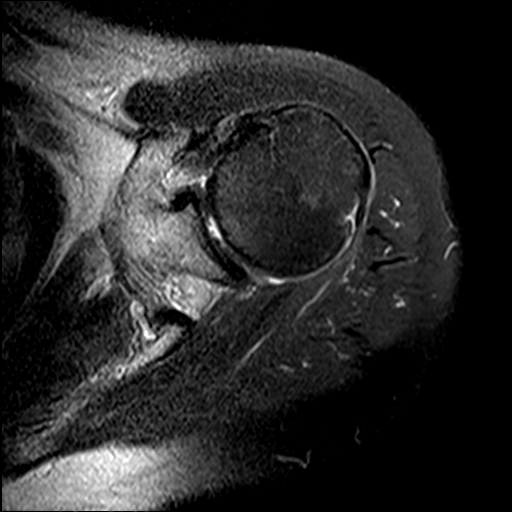
[im 23/30]
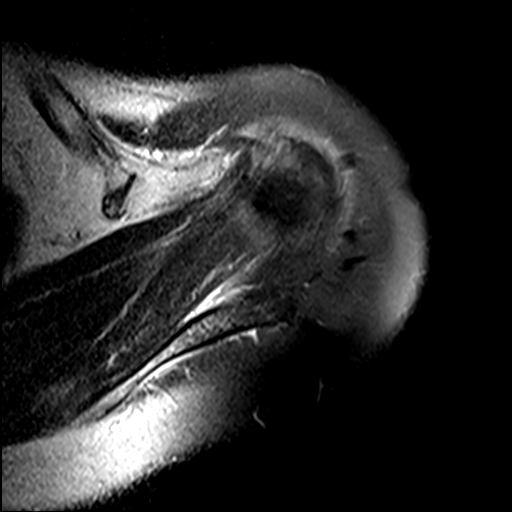
[im 26/30]
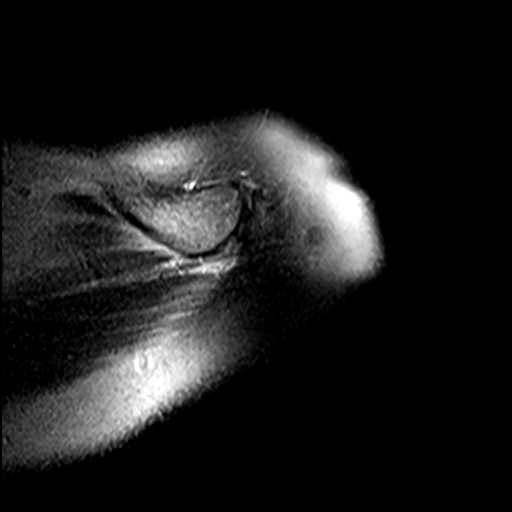
[im 30/30]
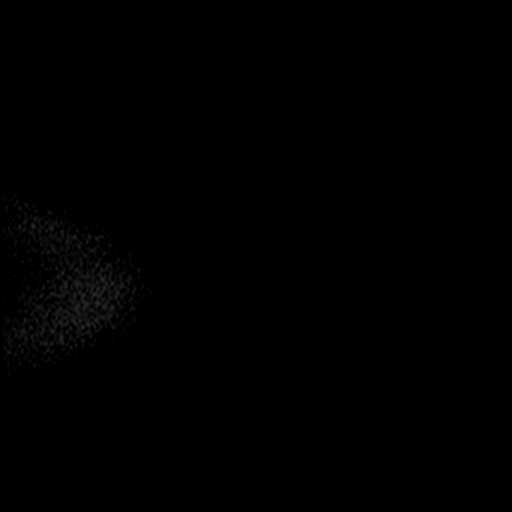

[Series 4: T2 fat-sat · oblique · 4.0mm · 0.59mm/px · 7 of 18 slices shown (2 of 3)]
[im 1/18]
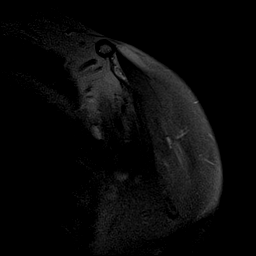
[im 3/18]
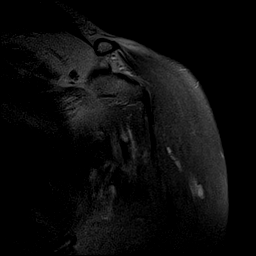
[im 6/18]
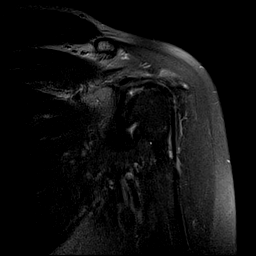
[im 9/18]
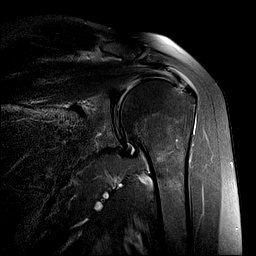
[im 12/18]
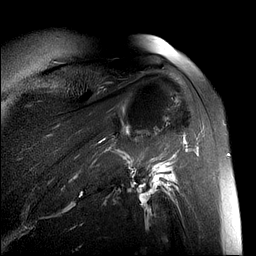
[im 15/18]
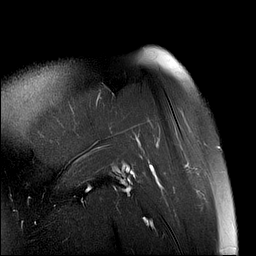
[im 18/18]
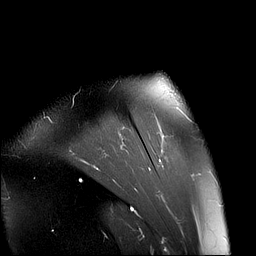

[Series 5: T2 fat-sat · oblique · 4.0mm · 0.59mm/px · 8 of 22 slices shown (3 of 3)]
[im 1/22]
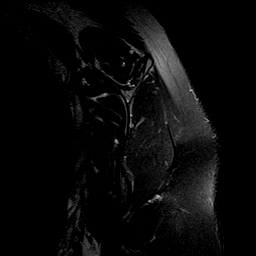
[im 4/22]
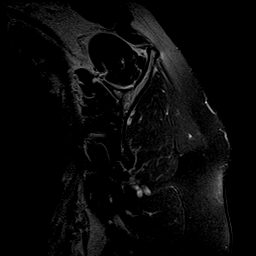
[im 7/22]
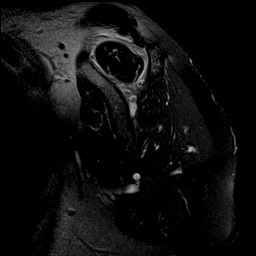
[im 10/22]
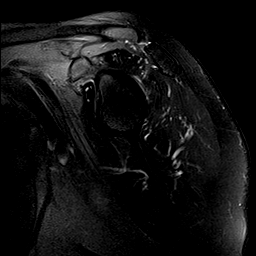
[im 13/22]
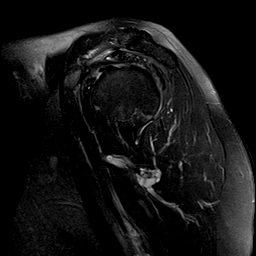
[im 16/22]
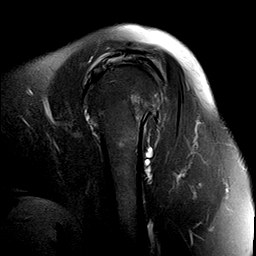
[im 19/22]
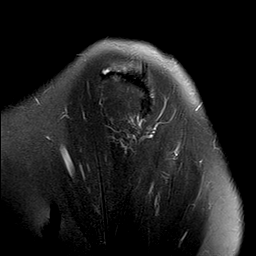
[im 22/22]
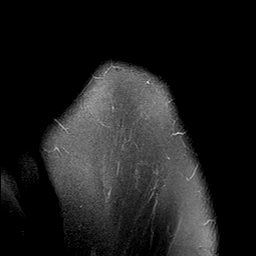

[Series 6: PD · oblique · 4.0mm · 0.59mm/px · 7 of 18 slices shown]
[im 1/18]
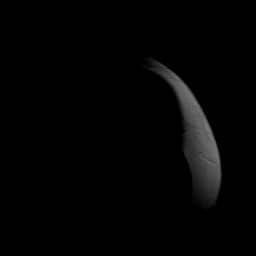
[im 3/18]
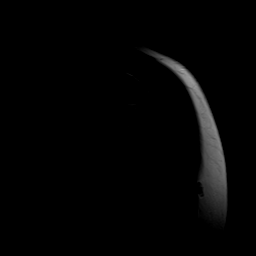
[im 6/18]
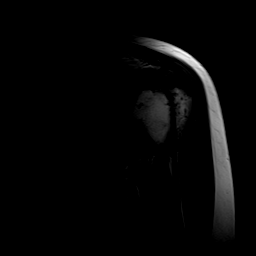
[im 9/18]
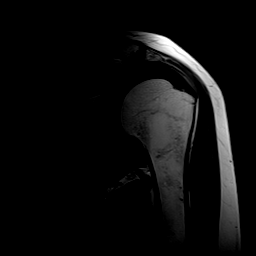
[im 12/18]
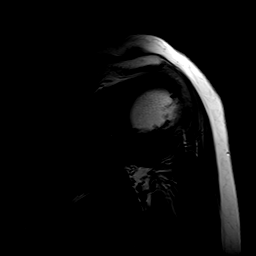
[im 15/18]
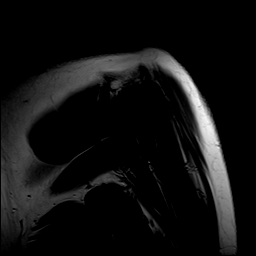
[im 18/18]
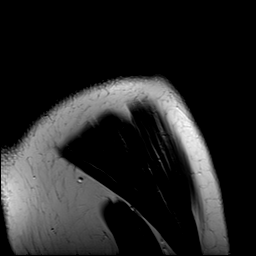

[Series 7: T1 · oblique · 4.0mm · 0.59mm/px · 8 of 22 slices shown]
[im 1/22]
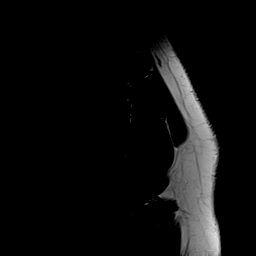
[im 4/22]
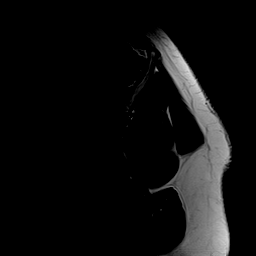
[im 7/22]
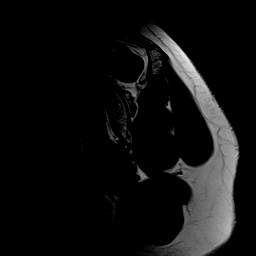
[im 10/22]
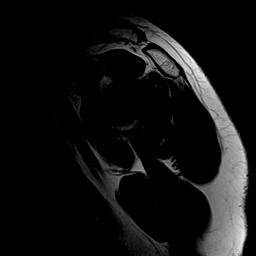
[im 13/22]
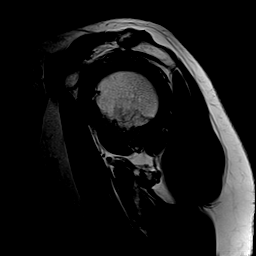
[im 16/22]
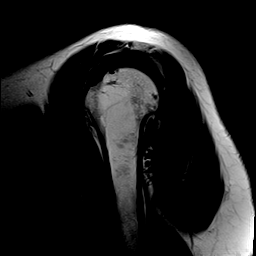
[im 19/22]
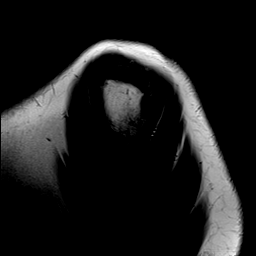
[im 22/22]
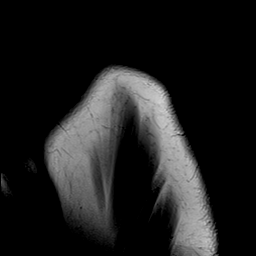

[40 of 40 positions shown; findings below may reference images not displayed]

FINDINGS: Rotator cuff: Moderate supraspinatus tendinosis with high-grade near
full-thickness tear of the anterior to mid tendon insertion with
interstitial and articular sided components (series 4, images
11-12). Tear measures approximately 9 mm in AP dimension. No
full-thickness component. Low-grade interstitial tears within the
subscapularis tendon. Infraspinatus and teres minor tendons intact.

Muscles: Preserved bulk and signal intensity of the rotator cuff
musculature without edema, atrophy, or fatty infiltration.

Biceps long head:  Intact.

Acromioclavicular Joint: Minimal degenerative changes at the AC
joint. Small volume subacromial-subdeltoid bursal fluid.

Glenohumeral Joint: Moderate diffuse chondral thinning and surface
irregularity. Small humeral head marginal osteophytes. No joint
effusion.

Labrum: Grossly intact, but evaluation is limited by lack of
intraarticular fluid.

Bones: No acute fracture. No dislocation. No bone marrow edema. No
suspicious bone lesion.

Other: None.
IMPRESSION: 1. Moderate supraspinatus tendinosis with high-grade near
full-thickness tearing of the anterior to mid tendon insertion.
2. Low-grade interstitial tears within the subscapularis tendon.
3. Moderate glenohumeral osteoarthritis.
4. Mild subacromial-subdeltoid bursitis.

## 2021-05-11 DIAGNOSIS — M810 Age-related osteoporosis without current pathological fracture: Secondary | ICD-10-CM | POA: Diagnosis not present

## 2021-05-11 DIAGNOSIS — I10 Essential (primary) hypertension: Secondary | ICD-10-CM | POA: Diagnosis not present

## 2021-05-11 DIAGNOSIS — E1121 Type 2 diabetes mellitus with diabetic nephropathy: Secondary | ICD-10-CM | POA: Diagnosis not present

## 2021-05-15 DIAGNOSIS — I10 Essential (primary) hypertension: Secondary | ICD-10-CM | POA: Diagnosis not present

## 2021-05-15 DIAGNOSIS — E782 Mixed hyperlipidemia: Secondary | ICD-10-CM | POA: Diagnosis not present

## 2021-05-15 DIAGNOSIS — E1121 Type 2 diabetes mellitus with diabetic nephropathy: Secondary | ICD-10-CM | POA: Diagnosis not present

## 2021-05-15 DIAGNOSIS — M810 Age-related osteoporosis without current pathological fracture: Secondary | ICD-10-CM | POA: Diagnosis not present

## 2021-05-29 DIAGNOSIS — I700 Atherosclerosis of aorta: Secondary | ICD-10-CM | POA: Diagnosis not present

## 2021-05-29 DIAGNOSIS — E1121 Type 2 diabetes mellitus with diabetic nephropathy: Secondary | ICD-10-CM | POA: Diagnosis not present

## 2021-05-29 DIAGNOSIS — M810 Age-related osteoporosis without current pathological fracture: Secondary | ICD-10-CM | POA: Diagnosis not present

## 2021-05-29 DIAGNOSIS — E782 Mixed hyperlipidemia: Secondary | ICD-10-CM | POA: Diagnosis not present

## 2021-05-29 DIAGNOSIS — I10 Essential (primary) hypertension: Secondary | ICD-10-CM | POA: Diagnosis not present

## 2021-05-29 DIAGNOSIS — Z638 Other specified problems related to primary support group: Secondary | ICD-10-CM | POA: Diagnosis not present

## 2021-05-29 DIAGNOSIS — Z862 Personal history of diseases of the blood and blood-forming organs and certain disorders involving the immune mechanism: Secondary | ICD-10-CM | POA: Diagnosis not present

## 2021-05-29 DIAGNOSIS — Z0000 Encounter for general adult medical examination without abnormal findings: Secondary | ICD-10-CM | POA: Diagnosis not present

## 2021-06-15 DIAGNOSIS — M810 Age-related osteoporosis without current pathological fracture: Secondary | ICD-10-CM | POA: Diagnosis not present

## 2021-06-15 DIAGNOSIS — I10 Essential (primary) hypertension: Secondary | ICD-10-CM | POA: Diagnosis not present

## 2021-06-15 DIAGNOSIS — E782 Mixed hyperlipidemia: Secondary | ICD-10-CM | POA: Diagnosis not present

## 2021-06-15 DIAGNOSIS — E1121 Type 2 diabetes mellitus with diabetic nephropathy: Secondary | ICD-10-CM | POA: Diagnosis not present

## 2021-07-16 ENCOUNTER — Telehealth: Payer: Self-pay

## 2021-07-26 DIAGNOSIS — J449 Chronic obstructive pulmonary disease, unspecified: Secondary | ICD-10-CM | POA: Diagnosis not present

## 2021-07-26 DIAGNOSIS — E1122 Type 2 diabetes mellitus with diabetic chronic kidney disease: Secondary | ICD-10-CM | POA: Diagnosis not present

## 2021-07-26 DIAGNOSIS — I10 Essential (primary) hypertension: Secondary | ICD-10-CM | POA: Diagnosis not present

## 2021-07-26 DIAGNOSIS — M810 Age-related osteoporosis without current pathological fracture: Secondary | ICD-10-CM | POA: Diagnosis not present

## 2021-07-26 DIAGNOSIS — Z7902 Long term (current) use of antithrombotics/antiplatelets: Secondary | ICD-10-CM | POA: Diagnosis not present

## 2021-07-26 DIAGNOSIS — E782 Mixed hyperlipidemia: Secondary | ICD-10-CM | POA: Diagnosis not present

## 2021-07-26 DIAGNOSIS — J302 Other seasonal allergic rhinitis: Secondary | ICD-10-CM | POA: Diagnosis not present

## 2021-08-27 DIAGNOSIS — H401133 Primary open-angle glaucoma, bilateral, severe stage: Secondary | ICD-10-CM | POA: Diagnosis not present

## 2021-08-30 DIAGNOSIS — E1121 Type 2 diabetes mellitus with diabetic nephropathy: Secondary | ICD-10-CM | POA: Diagnosis not present

## 2021-08-30 DIAGNOSIS — I10 Essential (primary) hypertension: Secondary | ICD-10-CM | POA: Diagnosis not present

## 2021-08-30 DIAGNOSIS — M810 Age-related osteoporosis without current pathological fracture: Secondary | ICD-10-CM | POA: Diagnosis not present

## 2021-08-30 DIAGNOSIS — Z7902 Long term (current) use of antithrombotics/antiplatelets: Secondary | ICD-10-CM | POA: Diagnosis not present

## 2021-08-30 DIAGNOSIS — E782 Mixed hyperlipidemia: Secondary | ICD-10-CM | POA: Diagnosis not present

## 2021-09-06 DIAGNOSIS — I10 Essential (primary) hypertension: Secondary | ICD-10-CM | POA: Diagnosis not present

## 2021-09-06 DIAGNOSIS — E782 Mixed hyperlipidemia: Secondary | ICD-10-CM | POA: Diagnosis not present

## 2021-09-06 DIAGNOSIS — M810 Age-related osteoporosis without current pathological fracture: Secondary | ICD-10-CM | POA: Diagnosis not present

## 2021-09-06 DIAGNOSIS — I700 Atherosclerosis of aorta: Secondary | ICD-10-CM | POA: Diagnosis not present

## 2021-09-06 DIAGNOSIS — E1121 Type 2 diabetes mellitus with diabetic nephropathy: Secondary | ICD-10-CM | POA: Diagnosis not present

## 2021-09-06 DIAGNOSIS — Z7902 Long term (current) use of antithrombotics/antiplatelets: Secondary | ICD-10-CM | POA: Diagnosis not present

## 2021-12-05 DIAGNOSIS — I10 Essential (primary) hypertension: Secondary | ICD-10-CM | POA: Diagnosis not present

## 2021-12-05 DIAGNOSIS — E782 Mixed hyperlipidemia: Secondary | ICD-10-CM | POA: Diagnosis not present

## 2021-12-05 DIAGNOSIS — E1121 Type 2 diabetes mellitus with diabetic nephropathy: Secondary | ICD-10-CM | POA: Diagnosis not present

## 2021-12-05 DIAGNOSIS — M810 Age-related osteoporosis without current pathological fracture: Secondary | ICD-10-CM | POA: Diagnosis not present

## 2021-12-12 DIAGNOSIS — I10 Essential (primary) hypertension: Secondary | ICD-10-CM | POA: Diagnosis not present

## 2021-12-12 DIAGNOSIS — Z7902 Long term (current) use of antithrombotics/antiplatelets: Secondary | ICD-10-CM | POA: Diagnosis not present

## 2021-12-12 DIAGNOSIS — E1121 Type 2 diabetes mellitus with diabetic nephropathy: Secondary | ICD-10-CM | POA: Diagnosis not present

## 2021-12-12 DIAGNOSIS — E782 Mixed hyperlipidemia: Secondary | ICD-10-CM | POA: Diagnosis not present

## 2021-12-12 DIAGNOSIS — M810 Age-related osteoporosis without current pathological fracture: Secondary | ICD-10-CM | POA: Diagnosis not present

## 2021-12-12 DIAGNOSIS — Z23 Encounter for immunization: Secondary | ICD-10-CM | POA: Diagnosis not present

## 2021-12-12 DIAGNOSIS — I700 Atherosclerosis of aorta: Secondary | ICD-10-CM | POA: Diagnosis not present

## 2021-12-12 DIAGNOSIS — G8929 Other chronic pain: Secondary | ICD-10-CM | POA: Diagnosis not present

## 2021-12-24 DIAGNOSIS — H401133 Primary open-angle glaucoma, bilateral, severe stage: Secondary | ICD-10-CM | POA: Diagnosis not present

## 2021-12-27 DIAGNOSIS — K1121 Acute sialoadenitis: Secondary | ICD-10-CM | POA: Diagnosis not present

## 2022-01-01 DIAGNOSIS — E1121 Type 2 diabetes mellitus with diabetic nephropathy: Secondary | ICD-10-CM | POA: Diagnosis not present

## 2022-01-01 DIAGNOSIS — E782 Mixed hyperlipidemia: Secondary | ICD-10-CM | POA: Diagnosis not present

## 2022-01-01 DIAGNOSIS — I10 Essential (primary) hypertension: Secondary | ICD-10-CM | POA: Diagnosis not present

## 2022-01-01 DIAGNOSIS — M5441 Lumbago with sciatica, right side: Secondary | ICD-10-CM | POA: Diagnosis not present

## 2022-01-09 DIAGNOSIS — H52209 Unspecified astigmatism, unspecified eye: Secondary | ICD-10-CM | POA: Diagnosis not present

## 2022-01-09 DIAGNOSIS — H5213 Myopia, bilateral: Secondary | ICD-10-CM | POA: Diagnosis not present

## 2022-01-09 DIAGNOSIS — H524 Presbyopia: Secondary | ICD-10-CM | POA: Diagnosis not present

## 2022-04-09 DIAGNOSIS — Z1231 Encounter for screening mammogram for malignant neoplasm of breast: Secondary | ICD-10-CM | POA: Diagnosis not present

## 2022-04-23 ENCOUNTER — Encounter (HOSPITAL_BASED_OUTPATIENT_CLINIC_OR_DEPARTMENT_OTHER): Payer: Self-pay | Admitting: Pulmonary Disease

## 2022-04-23 ENCOUNTER — Ambulatory Visit (INDEPENDENT_AMBULATORY_CARE_PROVIDER_SITE_OTHER): Payer: Medicare HMO | Admitting: Pulmonary Disease

## 2022-04-23 ENCOUNTER — Telehealth: Payer: Self-pay | Admitting: Pulmonary Disease

## 2022-04-23 VITALS — BP 130/70 | HR 70 | Ht 65.0 in | Wt 171.0 lb

## 2022-04-23 DIAGNOSIS — J449 Chronic obstructive pulmonary disease, unspecified: Secondary | ICD-10-CM

## 2022-04-23 DIAGNOSIS — D869 Sarcoidosis, unspecified: Secondary | ICD-10-CM | POA: Diagnosis not present

## 2022-04-23 DIAGNOSIS — J45909 Unspecified asthma, uncomplicated: Secondary | ICD-10-CM | POA: Diagnosis not present

## 2022-04-23 MED ORDER — ALBUTEROL SULFATE HFA 108 (90 BASE) MCG/ACT IN AERS: 2.0000 | INHALATION_SPRAY | Freq: Four times a day (QID) | RESPIRATORY_TRACT | 6 refills | Status: AC | PRN

## 2022-04-23 MED ORDER — FLUTICASONE-SALMETEROL 100-50 MCG/ACT IN AEPB: 1.0000 | INHALATION_SPRAY | Freq: Two times a day (BID) | RESPIRATORY_TRACT | 5 refills | Status: AC

## 2022-04-23 MED ORDER — ALBUTEROL SULFATE (2.5 MG/3ML) 0.083% IN NEBU: 2.5000 mg | INHALATION_SOLUTION | Freq: Four times a day (QID) | RESPIRATORY_TRACT | 5 refills | Status: AC | PRN

## 2022-04-23 MED ORDER — FLUTICASONE PROPIONATE 50 MCG/ACT NA SUSP: 2.0000 | Freq: Every day | NASAL | 2 refills | Status: DC

## 2022-04-23 MED ORDER — FLUTICASONE-SALMETEROL 100-50 MCG/ACT IN AEPB: 1.0000 | INHALATION_SPRAY | Freq: Two times a day (BID) | RESPIRATORY_TRACT | 5 refills | Status: DC

## 2022-04-23 MED ORDER — MONTELUKAST SODIUM 10 MG PO TABS: ORAL_TABLET | ORAL | 3 refills | Status: AC

## 2022-04-23 NOTE — Progress Notes (Signed)
Subjective:   PATIENT ID: Sara [name] GENDER: female DOB: 05-08-1945, MRN: [id number]   HPI  Chief Complaint  Patient presents with   Follow-up    Feeling pretty good no concerns    Reason for Visit: Follow-up sarcoidosis  [name] is a 77 year old female never smoker with reported sarcoidosis who presents for follow-up  Synopsis: She was diagnosed with sarcoidosis over 30 years ago. She was hospitalized for respiratory issues and reportedly had bronchoscopy for her diagnosis. She was treated with prednisone for 5+ years. After her hospitalizations she had asthma like symptoms for wheezing and shortness of breath and required bronchodilators. She was told she was in remission in 1990. No family history of sarcoid. She periodically uses her inhalers when triggered by activity, heat, pollen or allergens. 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   09/12/20 Since our last visit she is trying to more active and participating to senior work out class. At baseline her does not have any breathing issues. Denies shortness of breath, cough or wheezing. But does have exertional wheezing and cough with strenuous activity  04/23/22 Patient last seen on 09/12/20. She is using albuterol during cold weather. Sometimes up to twice a day. Triggered by cold and allergies. Taking flonase in the morning and singulair on most days with increased use during the winter. Drinking hot drinks help. Has wheezing with walking.  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   Past Medical History:  Diagnosis Date   Anxiety 07/23/2017   Asthma    Bronchitis    hx of;>27yr ago   Chronic neck pain    arthritis   [place]     Degenerative disc disease    neck   DeQuervain's disease (tenosynovitis)    Diverticulitis    Dizziness    related to neck issues   Frozen shoulder September 2012   GERD (gastroesophageal reflux disease)    takes Ranitidine nightly   Glaucoma    Hay fever    Headache(784.0)    related neck issues   High cholesterol    takes Lipitor daily   History of shingles 2012   Hypertension    takes Lotensin HCT daily   Insomnia    d/t pain and takes Ativan nightly   Osteoporosis    Sarcoidosis >39yrago   no problems since   Type II or unspecified type diabetes mellitus without mention of complication, not stated as uncontrolled    takes Metformin nightly    Allergies  Allergen Reactions   Penicillins Hives and Shortness Of Breath   Aspirin Other (See Comments)    Stomach upset, but low dose is ok   Hydromorphone Nausea Only    Other reaction(s): Sweating (intolerance)   Sulfa Antibiotics Other (See Comments)    unknown   Codeine Rash     Outpatient Medications Prior to Visit  Medication Sig Dispense Refill   Accu-Chek Softclix Lancets lancets CHECK BLOOD GLUCOSE 1 TIME EVERY DAY. Dx-R73.03 100 each 1   acetaminophen (TYLENOL) 650 MG CR tablet Take 1,300 mg by mouth every 8 (eight) hours as needed. For pain     alendronate (FOSAMAX) 70 MG tablet Take 70 mg by mouth once a week.     Ascorbic Acid (VITAMIN C PO)  Take 500 mg by mouth daily.      ASPIRIN 81 PO Take by mouth daily.     benazepril-hydrochlorthiazide (LOTENSIN HCT) 20-25 MG tablet Take 1 tablet Daily for BP 90 tablet 3   Blood Glucose Monitoring Suppl (ACCU-CHEK AVIVA PLUS) w/Device KIT USE AS DIRECTED  TO CHECK BLOOD SUGAR ONE TIME DAILY.DX-R73.03 1 kit 0   brimonidine (ALPHAGAN) 0.2 % ophthalmic solution Place 1 drop into both eyes daily.     calcium-vitamin D (OSCAL WITH D) 500-200 MG-UNIT tablet Take 1 tablet by mouth daily with breakfast.     Cholecalciferol (VITAMIN D3) 5000 units TABS Take 10,000 Units by mouth  daily.      diclofenac Sodium (VOLTAREN) 1 % GEL Apply 2 g topically 4 (four) times daily. 100 g 0   dorzolamide-timolol (COSOPT) 22.3-6.8 MG/ML ophthalmic solution Place 1 drop into both eyes 2 (two) times daily.     famotidine (PEPCID) 40 MG tablet TAKE 1 TABLET BY MOUTH  DAILY TO PREVENT  INDIGESTION AND HEARTBURN 90 tablet 3   gabapentin (NEURONTIN) 600 MG tablet TAKE 1/2 TO 1 TABLET BY  MOUTH 2 TO 3 TIMES DAILY IF NEEDED FOR PAIN 270 tablet 3   glucose blood (ACCU-CHEK AVIVA PLUS) test strip Check blood glucose 1 time daily-DX-R73.03 100 each 1   latanoprost (XALATAN) 0.005 % ophthalmic solution Place 1 drop into both eyes at bedtime.     LORazepam (ATIVAN) 1 MG tablet Take 1/2 - 1 tablet 2 - 3 x /day ONLY if needed for Anxiety Attack &  limit to 5 days /week to avoid Addiction & Dementia 30 tablet 0   Menaquinone-7 (K2 PO) Take by mouth.     Menaquinone-7 (VITAMIN K2 PO) Take by mouth daily.     metFORMIN (GLUCOPHAGE-XR) 500 MG 24 hr tablet Take 1 tab daily with meal for Diabetes 90 tablet 1   Multiple Vitamin (MULTIVITAMIN WITH MINERALS) TABS tablet Take 1 tablet by mouth daily.     OVER THE COUNTER MEDICATION daily at 6 (six) AM. B berry extract for eyes     rosuvastatin (CRESTOR) 40 MG tablet TAKE 1 TABLET(40 MG) BY MOUTH DAILY 90 tablet 2   traMADol (ULTRAM) 50 MG tablet Take 50 mg by mouth daily as needed.     albuterol (PROVENTIL) (2.5 MG/3ML) 0.083% nebulizer solution Take 3 mLs (2.5 mg total) by nebulization every 6 (six) hours as needed for wheezing or shortness of breath. 252 each 12   albuterol (VENTOLIN HFA) 108 (90 Base) MCG/ACT inhaler Inhale 2 puffs into the lungs every 6 (six) hours as needed for wheezing or shortness of breath. 8 g 6   fluticasone (FLONASE) 50 MCG/ACT nasal spray SHAKE LIQUID AND USE 2  SPRAYS IN EACH NOSTRIL  DAILY 48 g 1   fluticasone furoate-vilanterol (BREO ELLIPTA) 100-25 MCG/INH AEPB Inhale 1 puff into the lungs. Patient was only taking as needed      montelukast (SINGULAIR) 10 MG tablet Take    1 tablet     Daily      for  Allergies 90 tablet 11   baclofen (LIORESAL) 10 MG tablet TAKE ONE-HALF TO ONE TABLET BY MOUTH 2 TO 3 TIMES DAILY ONLY IF NEEDED FOR MUSCLE SPASM ATTACK AND LIMIT TO 5 DAYS PER WEEK TO AVOID ADDICTION AND DEMENTIA (Patient not taking: Reported on 04/23/2022) 90 tablet 0   No facility-administered medications prior to visit.    Review of Systems  Constitutional:  Negative for chills, diaphoresis, fever, malaise/fatigue  and weight loss.  HENT:  Negative for congestion.   Respiratory:  Positive for shortness of breath and wheezing. Negative for cough, hemoptysis and sputum production.   Cardiovascular:  Negative for chest pain, palpitations and leg swelling.     Objective:   Vitals:   04/23/22 1118  BP: 130/70  Pulse: 70  SpO2: 98%  Weight: 171 lb (77.6 kg)  Height: '5\' 5"'$  (1.651 m)   SpO2: 98 % O2 Device: None (Room air)  Physical Exam: General: Well-appearing, no acute distress HENT: [place], AT Eyes: EOMI, no scleral icterus Respiratory: Clear to auscultation bilaterally.  No crackles, wheezing or rales Cardiovascular: RRR, -M/R/G, no JVD Extremities:-Edema,-tenderness Neuro: AAO x4, CNII-XII grossly intact Psych: Normal mood, normal affect  Data Reviewed:  Imaging: CT Cardiac 05/24/20 - No mediastinal and adenopathy seen, minimal peribronchial wall thickening, minimal central bronchiectasis, small pericardial effusion  PFT: 09/12/20 FVC 1.69 (68%) FEV1 1.34 (71%) Ratio 75  TLC 75% DLCO 70% Interpretation: Mild restrictive lung disease with mild reduction in gas exchange. No significant bronchodilator response   Labs: CBC    Component Value Date/Time   WBC 3.9 03/02/2020 1136   RBC 4.42 03/02/2020 1136   HGB 13.2 03/02/2020 1136   HCT 39.7 03/02/2020 1136   PLT 228 03/02/2020 1136   MCV 89.8 03/02/2020 1136   MCH 29.9 03/02/2020 1136   MCHC 33.2 03/02/2020 1136   RDW 12.6 03/02/2020 1136    LYMPHSABS 1,474 03/02/2020 1136   MONOABS 418 05/21/2016 1029   EOSABS 70 03/02/2020 1136   BASOSABS 20 03/02/2020 1136   Absolute eos 03/02/20 - 70  BMET    Component Value Date/Time   NA 139 03/02/2020 1136   K 4.1 03/02/2020 1136   CL 104 03/02/2020 1136   CO2 29 03/02/2020 1136   GLUCOSE 125 (H) 03/02/2020 1136   BUN 11 03/02/2020 1136   CREATININE 0.82 03/02/2020 1136   CALCIUM 10.1 03/02/2020 1136   GFRNONAA 71 03/02/2020 1136   GFRAA 83 03/02/2020 1136      Latest Ref Rng & Units 03/02/2020   11:36 AM 12/01/2019    9:12 AM 08/17/2019   11:03 AM  Hepatic Function  Total Protein 6.1 - 8.1 g/dL 6.6  6.9  6.8   AST 10 - 35 U/L '18  24  17   '$ ALT 6 - 29 U/L '16  30  19   '$ Total Bilirubin 0.2 - 1.2 mg/dL 0.6  0.6  0.5       Assessment & Plan:   Discussion: 77 year old female with remote history of pulmonary sarcoid who presents for follow-up. Symptoms may be related to sarcoid however will manage with bronchodilators first. Discussed compliance and addressed questions regarding adverse effects.  We discussed the clinical course of sarcoid and management including serial PFTs, labs, eye exam, and EKG and chest imaging if indicated. If symptoms suggest sarcoid flare in the future, we would manage with steroids +/- biologics.  Pulmonary sarcoidosis with mild restrictive defect - Dx remotely via bronchoscopy - No indication for prednisone therapy - Annual PFTs. Last completed 08/2020. ORDER PFTs 10/2022 - Recommend annual ophthalmology exam.  Last visit on 05/2020 - EKG 09/12/20 with NSR with no ST changes or TWI. Prolonged PR interval - Obtain labs at next visit: CBC with diff, CMET  Shortness of breath and wheezing - START Wixela ONE puff in the morning and evening. Rinse mouth and tongue out after use -CONTINUE ALBUTEROL as needed for shortness of breath  or wheezing -Nebulizer medications refilled  Allergic Rhinitis -CONTINUE flonase -CONTINUE montelukast 10 mg  daily  Health Maintenance Immunization History  Administered Date(s) Administered   Influenza, High Dose Seasonal PF 01/04/2015, 12/31/2016, 12/05/2017, 02/10/2019   Influenza,inj,quad, With Preservative 02/16/2020   Influenza-Unspecified 03/01/2016   PFIZER(Purple Top)SARS-COV-2 Vaccination 10/14/2019, 11/17/2019   Pneumococcal Conjugate-13 11/16/2013   Pneumococcal Polysaccharide-23 04/12/2015   Pneumococcal-Unspecified 03/18/2005   Tdap 03/18/2009   Zoster, Live 11/13/2012   CT Lung Screen - not indicated  No orders of the defined types were placed in this encounter.  Meds ordered this encounter  Medications   albuterol (PROVENTIL) (2.5 MG/3ML) 0.083% nebulizer solution    Sig: Take 3 mLs (2.5 mg total) by nebulization every 6 (six) hours as needed for wheezing or shortness of breath.    Dispense:  3 mL    Refill:  5   albuterol (VENTOLIN HFA) 108 (90 Base) MCG/ACT inhaler    Sig: Inhale 2 puffs into the lungs every 6 (six) hours as needed for wheezing or shortness of breath.    Dispense:  8 g    Refill:  6   montelukast (SINGULAIR) 10 MG tablet    Sig: Take    1 tablet     Daily      for  Allergies    Dispense:  90 tablet    Refill:  3   DISCONTD: fluticasone-salmeterol (ADVAIR) 100-50 MCG/ACT AEPB    Sig: Inhale 1 puff into the lungs 2 (two) times daily.    Dispense:  60 each    Refill:  5   fluticasone (FLONASE) 50 MCG/ACT nasal spray    Sig: Place 2 sprays into both nostrils daily.    Dispense:  16 g    Refill:  2   fluticasone-salmeterol (WIXELA INHUB) 100-50 MCG/ACT AEPB    Sig: Inhale 1 puff into the lungs 2 (two) times daily.    Dispense:  60 each    Refill:  5    Plan for PFTs and sarcoid follow-up with labs at next visit  Follow-up with me in May and June 2024 No follow-ups on file.   I have spent a total time of 35-minutes on the day of the appointment including chart review, data review, collecting history, coordinating care and discussing medical  diagnosis and plan with the patient/family. Past medical history, allergies, medications were reviewed. Pertinent imaging, labs and tests included in this note have been reviewed and interpreted independently by me.  [place], MD [facility] 04/23/2022 1:05 PM  Office Number [phone]

## 2022-04-23 NOTE — Patient Instructions (Addendum)
Shortness of breath and wheezing - START Wixela ONE puff in the morning and evening. Rinse mouth and tongue out after use - CONTINUE ALBUTEROL as needed for shortness of breath or wheezing - Nebulizer medications refilled  Allergic Rhinitis -CONTINUE flonase -CONTINUE montelukast 10 mg daily  Plan for PFTs and sarcoid follow-up with labs at next visit  Follow-up with me in May and June 2024

## 2022-04-23 NOTE — Telephone Encounter (Signed)
Pt came in for an appt w/Dr. Loanne Drilling @ [facility] and I explained wrong Dr. Gabriel Carina. She was upset because she lives close to here. Woulld like to switch to Dr. Annamaria Boots. Is that OK with

## 2022-04-29 DIAGNOSIS — H401133 Primary open-angle glaucoma, bilateral, severe stage: Secondary | ICD-10-CM | POA: Diagnosis not present

## 2022-04-30 NOTE — Telephone Encounter (Signed)
I contacted patient because she was seen in clinic with me on 04/23/22 the same day that she was reported to be upset.  During the visit she was OK to schedule future follow-up at [facility] location.   Today on the phone patient wished to postpone PFTs and follow-up visit to July. Mendel Ryder, can you cancel June appointments and place recall for July?

## 2022-04-30 NOTE — Telephone Encounter (Signed)
Ok with me 

## 2022-05-01 DIAGNOSIS — M79672 Pain in left foot: Secondary | ICD-10-CM | POA: Diagnosis not present

## 2022-05-01 DIAGNOSIS — Z862 Personal history of diseases of the blood and blood-forming organs and certain disorders involving the immune mechanism: Secondary | ICD-10-CM | POA: Diagnosis not present

## 2022-05-01 DIAGNOSIS — M1990 Unspecified osteoarthritis, unspecified site: Secondary | ICD-10-CM | POA: Diagnosis not present

## 2022-05-01 DIAGNOSIS — M2550 Pain in unspecified joint: Secondary | ICD-10-CM | POA: Diagnosis not present

## 2022-05-17 DIAGNOSIS — E119 Type 2 diabetes mellitus without complications: Secondary | ICD-10-CM | POA: Diagnosis not present

## 2022-05-23 NOTE — Telephone Encounter (Signed)
Entered in error

## 2022-05-28 DIAGNOSIS — E1121 Type 2 diabetes mellitus with diabetic nephropathy: Secondary | ICD-10-CM | POA: Diagnosis not present

## 2022-05-28 DIAGNOSIS — E782 Mixed hyperlipidemia: Secondary | ICD-10-CM | POA: Diagnosis not present

## 2022-05-28 DIAGNOSIS — M810 Age-related osteoporosis without current pathological fracture: Secondary | ICD-10-CM | POA: Diagnosis not present

## 2022-05-29 NOTE — Telephone Encounter (Signed)
1 

## 2022-06-05 DIAGNOSIS — H524 Presbyopia: Secondary | ICD-10-CM | POA: Diagnosis not present

## 2022-06-05 DIAGNOSIS — H5213 Myopia, bilateral: Secondary | ICD-10-CM | POA: Diagnosis not present

## 2022-06-05 DIAGNOSIS — Z86010 Personal history of colonic polyps: Secondary | ICD-10-CM | POA: Diagnosis not present

## 2022-06-05 DIAGNOSIS — M810 Age-related osteoporosis without current pathological fracture: Secondary | ICD-10-CM | POA: Diagnosis not present

## 2022-06-05 DIAGNOSIS — J302 Other seasonal allergic rhinitis: Secondary | ICD-10-CM | POA: Diagnosis not present

## 2022-06-05 DIAGNOSIS — I10 Essential (primary) hypertension: Secondary | ICD-10-CM | POA: Diagnosis not present

## 2022-06-05 DIAGNOSIS — Z23 Encounter for immunization: Secondary | ICD-10-CM | POA: Diagnosis not present

## 2022-06-05 DIAGNOSIS — I700 Atherosclerosis of aorta: Secondary | ICD-10-CM | POA: Diagnosis not present

## 2022-06-05 DIAGNOSIS — G8929 Other chronic pain: Secondary | ICD-10-CM | POA: Diagnosis not present

## 2022-06-05 DIAGNOSIS — Z0000 Encounter for general adult medical examination without abnormal findings: Secondary | ICD-10-CM | POA: Diagnosis not present

## 2022-06-05 DIAGNOSIS — H52209 Unspecified astigmatism, unspecified eye: Secondary | ICD-10-CM | POA: Diagnosis not present

## 2022-06-05 DIAGNOSIS — E1121 Type 2 diabetes mellitus with diabetic nephropathy: Secondary | ICD-10-CM | POA: Diagnosis not present

## 2022-07-20 ENCOUNTER — Other Ambulatory Visit: Payer: Self-pay | Admitting: Pulmonary Disease

## 2022-08-09 ENCOUNTER — Ambulatory Visit (HOSPITAL_BASED_OUTPATIENT_CLINIC_OR_DEPARTMENT_OTHER): Payer: Medicare HMO | Admitting: Pulmonary Disease

## 2022-08-09 ENCOUNTER — Encounter (HOSPITAL_BASED_OUTPATIENT_CLINIC_OR_DEPARTMENT_OTHER): Payer: Medicare HMO

## 2022-09-02 DIAGNOSIS — H401133 Primary open-angle glaucoma, bilateral, severe stage: Secondary | ICD-10-CM | POA: Diagnosis not present

## 2022-09-05 DIAGNOSIS — I10 Essential (primary) hypertension: Secondary | ICD-10-CM | POA: Diagnosis not present

## 2022-09-05 DIAGNOSIS — E1121 Type 2 diabetes mellitus with diabetic nephropathy: Secondary | ICD-10-CM | POA: Diagnosis not present

## 2022-09-05 DIAGNOSIS — M810 Age-related osteoporosis without current pathological fracture: Secondary | ICD-10-CM | POA: Diagnosis not present

## 2022-09-27 DIAGNOSIS — M79671 Pain in right foot: Secondary | ICD-10-CM | POA: Diagnosis not present

## 2022-10-09 DIAGNOSIS — M79671 Pain in right foot: Secondary | ICD-10-CM | POA: Diagnosis not present

## 2022-10-09 DIAGNOSIS — S92911A Unspecified fracture of right toe(s), initial encounter for closed fracture: Secondary | ICD-10-CM | POA: Diagnosis not present

## 2022-11-06 DIAGNOSIS — S92911A Unspecified fracture of right toe(s), initial encounter for closed fracture: Secondary | ICD-10-CM | POA: Diagnosis not present

## 2023-01-06 DIAGNOSIS — H401133 Primary open-angle glaucoma, bilateral, severe stage: Secondary | ICD-10-CM | POA: Diagnosis not present

## 2023-02-11 DIAGNOSIS — E782 Mixed hyperlipidemia: Secondary | ICD-10-CM | POA: Diagnosis not present

## 2023-02-11 DIAGNOSIS — E1121 Type 2 diabetes mellitus with diabetic nephropathy: Secondary | ICD-10-CM | POA: Diagnosis not present

## 2023-02-11 DIAGNOSIS — I10 Essential (primary) hypertension: Secondary | ICD-10-CM | POA: Diagnosis not present

## 2023-02-11 DIAGNOSIS — I700 Atherosclerosis of aorta: Secondary | ICD-10-CM | POA: Diagnosis not present

## 2023-02-19 DIAGNOSIS — Z23 Encounter for immunization: Secondary | ICD-10-CM | POA: Diagnosis not present

## 2023-04-29 DIAGNOSIS — Z1231 Encounter for screening mammogram for malignant neoplasm of breast: Secondary | ICD-10-CM | POA: Diagnosis not present

## 2023-05-19 DIAGNOSIS — E119 Type 2 diabetes mellitus without complications: Secondary | ICD-10-CM | POA: Diagnosis not present

## 2023-06-04 DIAGNOSIS — I10 Essential (primary) hypertension: Secondary | ICD-10-CM | POA: Diagnosis not present

## 2023-06-04 DIAGNOSIS — E782 Mixed hyperlipidemia: Secondary | ICD-10-CM | POA: Diagnosis not present

## 2023-06-04 DIAGNOSIS — E1121 Type 2 diabetes mellitus with diabetic nephropathy: Secondary | ICD-10-CM | POA: Diagnosis not present

## 2023-06-09 DIAGNOSIS — I10 Essential (primary) hypertension: Secondary | ICD-10-CM | POA: Diagnosis not present

## 2023-06-09 DIAGNOSIS — Z862 Personal history of diseases of the blood and blood-forming organs and certain disorders involving the immune mechanism: Secondary | ICD-10-CM | POA: Diagnosis not present

## 2023-06-09 DIAGNOSIS — E1121 Type 2 diabetes mellitus with diabetic nephropathy: Secondary | ICD-10-CM | POA: Diagnosis not present

## 2023-06-09 DIAGNOSIS — J302 Other seasonal allergic rhinitis: Secondary | ICD-10-CM | POA: Diagnosis not present

## 2023-06-09 DIAGNOSIS — M810 Age-related osteoporosis without current pathological fracture: Secondary | ICD-10-CM | POA: Diagnosis not present

## 2023-06-09 DIAGNOSIS — Z0000 Encounter for general adult medical examination without abnormal findings: Secondary | ICD-10-CM | POA: Diagnosis not present

## 2023-06-09 DIAGNOSIS — E782 Mixed hyperlipidemia: Secondary | ICD-10-CM | POA: Diagnosis not present

## 2023-06-09 DIAGNOSIS — I700 Atherosclerosis of aorta: Secondary | ICD-10-CM | POA: Diagnosis not present

## 2023-06-09 DIAGNOSIS — J449 Chronic obstructive pulmonary disease, unspecified: Secondary | ICD-10-CM | POA: Diagnosis not present

## 2023-06-24 DIAGNOSIS — Z862 Personal history of diseases of the blood and blood-forming organs and certain disorders involving the immune mechanism: Secondary | ICD-10-CM | POA: Diagnosis not present

## 2023-06-24 DIAGNOSIS — M2550 Pain in unspecified joint: Secondary | ICD-10-CM | POA: Diagnosis not present

## 2023-06-24 DIAGNOSIS — M1990 Unspecified osteoarthritis, unspecified site: Secondary | ICD-10-CM | POA: Diagnosis not present

## 2023-06-24 DIAGNOSIS — J45909 Unspecified asthma, uncomplicated: Secondary | ICD-10-CM | POA: Diagnosis not present

## 2023-06-26 DIAGNOSIS — I10 Essential (primary) hypertension: Secondary | ICD-10-CM | POA: Diagnosis not present

## 2023-07-04 ENCOUNTER — Other Ambulatory Visit: Payer: Self-pay | Admitting: Pulmonary Disease

## 2023-07-10 DIAGNOSIS — I10 Essential (primary) hypertension: Secondary | ICD-10-CM | POA: Diagnosis not present

## 2023-07-24 DIAGNOSIS — I10 Essential (primary) hypertension: Secondary | ICD-10-CM | POA: Diagnosis not present

## 2023-08-05 DIAGNOSIS — H52209 Unspecified astigmatism, unspecified eye: Secondary | ICD-10-CM | POA: Diagnosis not present

## 2023-08-05 DIAGNOSIS — H5213 Myopia, bilateral: Secondary | ICD-10-CM | POA: Diagnosis not present

## 2023-08-05 DIAGNOSIS — H524 Presbyopia: Secondary | ICD-10-CM | POA: Diagnosis not present

## 2023-09-02 DIAGNOSIS — J449 Chronic obstructive pulmonary disease, unspecified: Secondary | ICD-10-CM | POA: Diagnosis not present

## 2023-09-02 DIAGNOSIS — E1121 Type 2 diabetes mellitus with diabetic nephropathy: Secondary | ICD-10-CM | POA: Diagnosis not present

## 2023-09-02 DIAGNOSIS — E782 Mixed hyperlipidemia: Secondary | ICD-10-CM | POA: Diagnosis not present

## 2023-09-02 DIAGNOSIS — M810 Age-related osteoporosis without current pathological fracture: Secondary | ICD-10-CM | POA: Diagnosis not present

## 2023-09-02 DIAGNOSIS — I10 Essential (primary) hypertension: Secondary | ICD-10-CM | POA: Diagnosis not present

## 2023-09-05 DIAGNOSIS — T783XXA Angioneurotic edema, initial encounter: Secondary | ICD-10-CM | POA: Diagnosis not present

## 2023-09-10 DIAGNOSIS — H401133 Primary open-angle glaucoma, bilateral, severe stage: Secondary | ICD-10-CM | POA: Diagnosis not present

## 2023-10-07 ENCOUNTER — Ambulatory Visit: Admitting: Adult Health

## 2023-10-09 DIAGNOSIS — H401133 Primary open-angle glaucoma, bilateral, severe stage: Secondary | ICD-10-CM | POA: Diagnosis not present

## 2023-11-13 ENCOUNTER — Ambulatory Visit (HOSPITAL_BASED_OUTPATIENT_CLINIC_OR_DEPARTMENT_OTHER): Admitting: Adult Health

## 2023-12-15 DIAGNOSIS — Z23 Encounter for immunization: Secondary | ICD-10-CM | POA: Diagnosis not present

## 2024-01-01 DIAGNOSIS — M1712 Unilateral primary osteoarthritis, left knee: Secondary | ICD-10-CM | POA: Diagnosis not present

## 2024-01-01 DIAGNOSIS — M25562 Pain in left knee: Secondary | ICD-10-CM | POA: Diagnosis not present

## 2024-03-26 ENCOUNTER — Other Ambulatory Visit: Payer: Self-pay | Admitting: Pulmonary Disease
